# Patient Record
Sex: Male | Born: 1935 | Race: White | Hispanic: No | Marital: Married | Smoking: Former smoker
Health system: Southern US, Community
[De-identification: ages and names within clinical notes are randomized; demographics above are authoritative.]

## PROBLEM LIST (undated history)

## (undated) DIAGNOSIS — E039 Hypothyroidism, unspecified: Secondary | ICD-10-CM

## (undated) DIAGNOSIS — E119 Type 2 diabetes mellitus without complications: Secondary | ICD-10-CM

## (undated) DIAGNOSIS — M545 Low back pain, unspecified: Secondary | ICD-10-CM

## (undated) DIAGNOSIS — I872 Venous insufficiency (chronic) (peripheral): Secondary | ICD-10-CM

## (undated) DIAGNOSIS — N184 Chronic kidney disease, stage 4 (severe): Secondary | ICD-10-CM

## (undated) DIAGNOSIS — M1990 Unspecified osteoarthritis, unspecified site: Secondary | ICD-10-CM

## (undated) DIAGNOSIS — K219 Gastro-esophageal reflux disease without esophagitis: Secondary | ICD-10-CM

## (undated) DIAGNOSIS — D638 Anemia in other chronic diseases classified elsewhere: Secondary | ICD-10-CM

## (undated) DIAGNOSIS — R0989 Other specified symptoms and signs involving the circulatory and respiratory systems: Secondary | ICD-10-CM

## (undated) DIAGNOSIS — I441 Atrioventricular block, second degree: Secondary | ICD-10-CM

## (undated) DIAGNOSIS — K5790 Diverticulosis of intestine, part unspecified, without perforation or abscess without bleeding: Secondary | ICD-10-CM

## (undated) DIAGNOSIS — I2510 Atherosclerotic heart disease of native coronary artery without angina pectoris: Secondary | ICD-10-CM

## (undated) DIAGNOSIS — I5032 Chronic diastolic (congestive) heart failure: Secondary | ICD-10-CM

## (undated) DIAGNOSIS — I459 Conduction disorder, unspecified: Secondary | ICD-10-CM

## (undated) DIAGNOSIS — M25551 Pain in right hip: Secondary | ICD-10-CM

## (undated) DIAGNOSIS — I4892 Unspecified atrial flutter: Secondary | ICD-10-CM

## (undated) DIAGNOSIS — E785 Hyperlipidemia, unspecified: Secondary | ICD-10-CM

## (undated) DIAGNOSIS — I10 Essential (primary) hypertension: Secondary | ICD-10-CM

## (undated) DIAGNOSIS — J449 Chronic obstructive pulmonary disease, unspecified: Secondary | ICD-10-CM

## (undated) HISTORY — DX: Atherosclerotic heart disease of native coronary artery without angina pectoris: I25.10

## (undated) HISTORY — DX: Other specified symptoms and signs involving the circulatory and respiratory systems: R09.89

## (undated) HISTORY — DX: Low back pain, unspecified: M54.50

## (undated) HISTORY — DX: Venous insufficiency (chronic) (peripheral): I87.2

## (undated) HISTORY — DX: Pain in right hip: M25.551

## (undated) HISTORY — DX: Unspecified osteoarthritis, unspecified site: M19.90

## (undated) HISTORY — DX: Essential (primary) hypertension: I10

## (undated) HISTORY — DX: Diverticulosis of intestine, part unspecified, without perforation or abscess without bleeding: K57.90

## (undated) HISTORY — PX: TRANSTHORACIC ECHOCARDIOGRAM: SHX275

## (undated) HISTORY — DX: Type 2 diabetes mellitus without complications: E11.9

## (undated) HISTORY — DX: Anemia in other chronic diseases classified elsewhere: D63.8

## (undated) HISTORY — DX: Atrioventricular block, second degree: I44.1

## (undated) HISTORY — DX: Chronic diastolic (congestive) heart failure: I50.32

## (undated) HISTORY — DX: Low back pain: M54.5

## (undated) HISTORY — DX: Gastro-esophageal reflux disease without esophagitis: K21.9

## (undated) HISTORY — DX: Hypothyroidism, unspecified: E03.9

## (undated) HISTORY — DX: Conduction disorder, unspecified: I45.9

## (undated) HISTORY — DX: Hyperlipidemia, unspecified: E78.5

## (undated) HISTORY — PX: APPENDECTOMY: SHX54

## (undated) HISTORY — DX: Chronic obstructive pulmonary disease, unspecified: J44.9

## (undated) HISTORY — DX: Chronic kidney disease, stage 4 (severe): N18.4

## (undated) HISTORY — DX: Unspecified atrial flutter: I48.92

---

## 1996-11-17 ENCOUNTER — Encounter (INDEPENDENT_AMBULATORY_CARE_PROVIDER_SITE_OTHER): Payer: Self-pay | Admitting: *Deleted

## 1998-04-19 ENCOUNTER — Ambulatory Visit (HOSPITAL_COMMUNITY): Admission: RE | Admit: 1998-04-19 | Discharge: 1998-04-19 | Payer: Self-pay | Admitting: Gastroenterology

## 1998-04-19 ENCOUNTER — Encounter (INDEPENDENT_AMBULATORY_CARE_PROVIDER_SITE_OTHER): Payer: Self-pay | Admitting: *Deleted

## 2002-11-21 ENCOUNTER — Encounter: Payer: Self-pay | Admitting: *Deleted

## 2002-11-21 ENCOUNTER — Ambulatory Visit (HOSPITAL_COMMUNITY): Admission: RE | Admit: 2002-11-21 | Discharge: 2002-11-21 | Payer: Self-pay | Admitting: *Deleted

## 2003-02-13 ENCOUNTER — Inpatient Hospital Stay (HOSPITAL_COMMUNITY): Admission: AD | Admit: 2003-02-13 | Discharge: 2003-03-05 | Payer: Self-pay | Admitting: *Deleted

## 2003-02-13 ENCOUNTER — Encounter: Payer: Self-pay | Admitting: *Deleted

## 2003-03-13 ENCOUNTER — Encounter
Admission: RE | Admit: 2003-03-13 | Discharge: 2003-03-13 | Payer: Self-pay | Admitting: Thoracic Surgery (Cardiothoracic Vascular Surgery)

## 2003-03-27 ENCOUNTER — Encounter
Admission: RE | Admit: 2003-03-27 | Discharge: 2003-03-27 | Payer: Self-pay | Admitting: Thoracic Surgery (Cardiothoracic Vascular Surgery)

## 2003-04-01 ENCOUNTER — Encounter (HOSPITAL_COMMUNITY): Admission: RE | Admit: 2003-04-01 | Discharge: 2003-05-25 | Payer: Self-pay | Admitting: *Deleted

## 2003-04-25 HISTORY — PX: CORONARY ARTERY BYPASS GRAFT: SHX141

## 2004-03-08 ENCOUNTER — Ambulatory Visit: Payer: Self-pay | Admitting: Internal Medicine

## 2004-06-20 ENCOUNTER — Ambulatory Visit: Payer: Self-pay | Admitting: Internal Medicine

## 2004-06-20 ENCOUNTER — Ambulatory Visit: Payer: Self-pay | Admitting: *Deleted

## 2004-06-27 ENCOUNTER — Ambulatory Visit: Payer: Self-pay | Admitting: Internal Medicine

## 2004-08-08 ENCOUNTER — Ambulatory Visit: Payer: Self-pay | Admitting: Internal Medicine

## 2004-08-15 ENCOUNTER — Ambulatory Visit: Payer: Self-pay | Admitting: *Deleted

## 2004-08-15 ENCOUNTER — Ambulatory Visit: Payer: Self-pay | Admitting: Gastroenterology

## 2004-10-17 ENCOUNTER — Ambulatory Visit: Payer: Self-pay | Admitting: Gastroenterology

## 2004-12-19 ENCOUNTER — Ambulatory Visit: Payer: Self-pay | Admitting: Internal Medicine

## 2005-01-16 ENCOUNTER — Ambulatory Visit: Payer: Self-pay | Admitting: Cardiology

## 2005-03-13 ENCOUNTER — Ambulatory Visit: Payer: Self-pay | Admitting: Internal Medicine

## 2005-05-01 ENCOUNTER — Ambulatory Visit: Payer: Self-pay | Admitting: Internal Medicine

## 2005-08-07 ENCOUNTER — Ambulatory Visit: Payer: Self-pay | Admitting: Internal Medicine

## 2005-09-20 ENCOUNTER — Ambulatory Visit: Payer: Self-pay | Admitting: Internal Medicine

## 2005-12-04 ENCOUNTER — Ambulatory Visit: Payer: Self-pay | Admitting: Internal Medicine

## 2006-01-17 ENCOUNTER — Ambulatory Visit: Payer: Self-pay | Admitting: Cardiology

## 2006-02-21 ENCOUNTER — Ambulatory Visit: Payer: Self-pay | Admitting: Internal Medicine

## 2006-02-21 LAB — CONVERTED CEMR LAB
BUN: 30 mg/dL — ABNORMAL HIGH (ref 6–23)
CO2: 29 meq/L (ref 19–32)
Calcium: 9.9 mg/dL (ref 8.4–10.5)
Chloride: 105 meq/L (ref 96–112)
Creatinine, Ser: 1.7 mg/dL — ABNORMAL HIGH (ref 0.4–1.5)
GFR calc non Af Amer: 42 mL/min
Glomerular Filtration Rate, Af Am: 51 mL/min/{1.73_m2}
Glucose, Bld: 159 mg/dL — ABNORMAL HIGH (ref 70–99)
Hgb A1c MFr Bld: 6.1 % — ABNORMAL HIGH (ref 4.6–6.0)
Potassium: 5 meq/L (ref 3.5–5.1)
Sodium: 139 meq/L (ref 135–145)
T4, Total: 7.9 ug/dL (ref 5.0–12.5)
TSH: 1.38 microintl units/mL (ref 0.35–5.50)

## 2006-03-06 ENCOUNTER — Ambulatory Visit: Payer: Self-pay | Admitting: Internal Medicine

## 2006-03-19 ENCOUNTER — Encounter: Admission: RE | Admit: 2006-03-19 | Discharge: 2006-06-17 | Payer: Self-pay | Admitting: Internal Medicine

## 2006-06-11 ENCOUNTER — Ambulatory Visit: Payer: Self-pay | Admitting: Internal Medicine

## 2006-06-11 LAB — CONVERTED CEMR LAB
BUN: 32 mg/dL — ABNORMAL HIGH (ref 6–23)
CO2: 32 meq/L (ref 19–32)
Calcium: 10 mg/dL (ref 8.4–10.5)
Chloride: 107 meq/L (ref 96–112)
Creatinine, Ser: 1.4 mg/dL (ref 0.4–1.5)
GFR calc Af Amer: 63 mL/min
GFR calc non Af Amer: 52 mL/min
Glucose, Bld: 113 mg/dL — ABNORMAL HIGH (ref 70–99)
Hgb A1c MFr Bld: 6.2 % — ABNORMAL HIGH (ref 4.6–6.0)
Potassium: 5.1 meq/L (ref 3.5–5.1)
Sodium: 144 meq/L (ref 135–145)

## 2006-09-10 ENCOUNTER — Ambulatory Visit: Payer: Self-pay | Admitting: Internal Medicine

## 2006-09-10 LAB — CONVERTED CEMR LAB
BUN: 31 mg/dL — ABNORMAL HIGH (ref 6–23)
CO2: 28 meq/L (ref 19–32)
Calcium: 9.5 mg/dL (ref 8.4–10.5)
Chloride: 109 meq/L (ref 96–112)
Cholesterol: 162 mg/dL (ref 0–200)
Creatinine, Ser: 1.6 mg/dL — ABNORMAL HIGH (ref 0.4–1.5)
GFR calc Af Amer: 54 mL/min
GFR calc non Af Amer: 45 mL/min
Glucose, Bld: 100 mg/dL — ABNORMAL HIGH (ref 70–99)
HDL: 68.6 mg/dL (ref >39.0)
Hgb A1c MFr Bld: 6.2 % — ABNORMAL HIGH (ref 4.6–6.0)
LDL Cholesterol: 80 mg/dL (ref 0–99)
Potassium: 5.1 meq/L (ref 3.5–5.1)
Sodium: 141 meq/L (ref 135–145)
Total CHOL/HDL Ratio: 2.4
Triglycerides: 67 mg/dL (ref 0–149)
VLDL: 13 mg/dL (ref 0–40)

## 2006-12-13 ENCOUNTER — Ambulatory Visit: Payer: Self-pay | Admitting: Internal Medicine

## 2006-12-28 ENCOUNTER — Ambulatory Visit: Payer: Self-pay

## 2006-12-28 ENCOUNTER — Ambulatory Visit: Payer: Self-pay | Admitting: Cardiology

## 2007-01-04 ENCOUNTER — Ambulatory Visit: Payer: Self-pay

## 2007-04-12 ENCOUNTER — Ambulatory Visit: Payer: Self-pay | Admitting: Internal Medicine

## 2007-04-12 DIAGNOSIS — I2510 Atherosclerotic heart disease of native coronary artery without angina pectoris: Secondary | ICD-10-CM | POA: Insufficient documentation

## 2007-04-12 DIAGNOSIS — N183 Chronic kidney disease, stage 3 unspecified: Secondary | ICD-10-CM | POA: Insufficient documentation

## 2007-04-12 DIAGNOSIS — I739 Peripheral vascular disease, unspecified: Secondary | ICD-10-CM | POA: Insufficient documentation

## 2007-04-12 DIAGNOSIS — E785 Hyperlipidemia, unspecified: Secondary | ICD-10-CM | POA: Insufficient documentation

## 2007-04-12 DIAGNOSIS — M1990 Unspecified osteoarthritis, unspecified site: Secondary | ICD-10-CM | POA: Insufficient documentation

## 2007-04-12 DIAGNOSIS — M545 Low back pain, unspecified: Secondary | ICD-10-CM | POA: Insufficient documentation

## 2007-04-12 DIAGNOSIS — J449 Chronic obstructive pulmonary disease, unspecified: Secondary | ICD-10-CM | POA: Insufficient documentation

## 2007-04-12 DIAGNOSIS — E039 Hypothyroidism, unspecified: Secondary | ICD-10-CM | POA: Insufficient documentation

## 2007-04-12 DIAGNOSIS — K8020 Calculus of gallbladder without cholecystitis without obstruction: Secondary | ICD-10-CM | POA: Insufficient documentation

## 2007-04-12 DIAGNOSIS — K219 Gastro-esophageal reflux disease without esophagitis: Secondary | ICD-10-CM | POA: Insufficient documentation

## 2007-04-12 DIAGNOSIS — I10 Essential (primary) hypertension: Secondary | ICD-10-CM | POA: Insufficient documentation

## 2007-08-09 ENCOUNTER — Ambulatory Visit: Payer: Self-pay | Admitting: Internal Medicine

## 2007-08-09 LAB — CONVERTED CEMR LAB
ALT: 15 U/L
AST: 23 U/L
Albumin: 3.7 g/dL
Alkaline Phosphatase: 71 U/L
BUN: 35 mg/dL — ABNORMAL HIGH
Basophils Absolute: 0 10*3/uL
Basophils Relative: 0 %
Bilirubin, Direct: 0.1 mg/dL
CO2: 32 meq/L
Calcium: 9.7 mg/dL
Chloride: 103 meq/L
Cholesterol: 154 mg/dL
Creatinine, Ser: 1.4 mg/dL
Eosinophils Absolute: 0.2 10*3/uL
Eosinophils Relative: 3.3 %
GFR calc Af Amer: 63 mL/min
GFR calc non Af Amer: 52 mL/min
Glucose, Bld: 107 mg/dL — ABNORMAL HIGH
HCT: 39.8 %
HDL: 60.3 mg/dL
Hemoglobin: 13.1 g/dL
LDL Cholesterol: 84 mg/dL
Lymphocytes Relative: 27.7 %
MCHC: 32.9 g/dL
MCV: 87.8 fL
Monocytes Absolute: 0.5 10*3/uL
Monocytes Relative: 8.9 %
Neutro Abs: 3.1 10*3/uL
Neutrophils Relative %: 60.1 %
PSA: 0.64 ng/mL
Platelets: 191 10*3/uL
Potassium: 5.1 meq/L
RBC: 4.53 M/uL
RDW: 13.2 %
Sodium: 139 meq/L
TSH: 0.65 u[IU]/mL
Total Bilirubin: 0.9 mg/dL
Total CHOL/HDL Ratio: 2.6
Total Protein: 6.7 g/dL
Triglycerides: 50 mg/dL
VLDL: 10 mg/dL
WBC: 5.3 10*3/uL

## 2007-08-14 ENCOUNTER — Ambulatory Visit: Payer: Self-pay | Admitting: Internal Medicine

## 2008-02-10 ENCOUNTER — Ambulatory Visit: Payer: Self-pay | Admitting: Internal Medicine

## 2008-02-10 DIAGNOSIS — M25569 Pain in unspecified knee: Secondary | ICD-10-CM | POA: Insufficient documentation

## 2008-08-06 ENCOUNTER — Ambulatory Visit: Payer: Self-pay | Admitting: Internal Medicine

## 2008-08-06 LAB — CONVERTED CEMR LAB
ALT: 14 units/L (ref 0–53)
AST: 22 units/L (ref 0–37)
Albumin: 3.8 g/dL (ref 3.5–5.2)
Alkaline Phosphatase: 90 units/L (ref 39–117)
BUN: 24 mg/dL — ABNORMAL HIGH (ref 6–23)
Basophils Absolute: 0 10*3/uL (ref 0.0–0.1)
Basophils Relative: 0.2 % (ref 0.0–3.0)
Bilirubin Urine: NEGATIVE
Bilirubin, Direct: 0.1 mg/dL (ref 0.0–0.3)
CO2: 30 meq/L (ref 19–32)
Calcium: 9.8 mg/dL (ref 8.4–10.5)
Chloride: 106 meq/L (ref 96–112)
Cholesterol: 159 mg/dL (ref 0–200)
Creatinine, Ser: 1.6 mg/dL — ABNORMAL HIGH (ref 0.4–1.5)
Eosinophils Absolute: 0.2 10*3/uL (ref 0.0–0.7)
Eosinophils Relative: 2.8 % (ref 0.0–5.0)
GFR calc non Af Amer: 44.42 mL/min (ref >60)
Glucose, Bld: 96 mg/dL (ref 70–99)
HCT: 40.8 % (ref 39.0–52.0)
HDL: 67.1 mg/dL (ref >39.00)
Hemoglobin, Urine: NEGATIVE
Hemoglobin: 13.9 g/dL (ref 13.0–17.0)
Ketones, ur: NEGATIVE mg/dL
LDL Cholesterol: 79 mg/dL (ref 0–99)
Leukocytes, UA: NEGATIVE
Lymphocytes Relative: 16.5 % (ref 12.0–46.0)
Lymphs Abs: 1.3 10*3/uL (ref 0.7–4.0)
MCHC: 34.1 g/dL (ref 30.0–36.0)
MCV: 88.6 fL (ref 78.0–100.0)
Monocytes Absolute: 0.5 10*3/uL (ref 0.1–1.0)
Monocytes Relative: 7 % (ref 3.0–12.0)
Neutro Abs: 5.6 10*3/uL (ref 1.4–7.7)
Neutrophils Relative %: 73.5 % (ref 43.0–77.0)
Nitrite: NEGATIVE
PSA: 0.67 ng/mL (ref 0.10–4.00)
Platelets: 137 10*3/uL — ABNORMAL LOW (ref 150.0–400.0)
Potassium: 5 meq/L (ref 3.5–5.1)
RBC: 4.61 M/uL (ref 4.22–5.81)
RDW: 13.4 % (ref 11.5–14.6)
Sodium: 141 meq/L (ref 135–145)
Specific Gravity, Urine: >=1.03 (ref 1.000–1.030)
TSH: 1.13 microintl units/mL (ref 0.35–5.50)
Total Bilirubin: 1.1 mg/dL (ref 0.3–1.2)
Total CHOL/HDL Ratio: 2
Total Protein, Urine: NEGATIVE mg/dL
Total Protein: 6.7 g/dL (ref 6.0–8.3)
Triglycerides: 66 mg/dL (ref 0.0–149.0)
Urine Glucose: NEGATIVE mg/dL
Urobilinogen, UA: 0.2 (ref 0.0–1.0)
VLDL: 13.2 mg/dL (ref 0.0–40.0)
WBC: 7.6 10*3/uL (ref 4.5–10.5)
pH: 5.5 (ref 5.0–8.0)

## 2008-08-11 ENCOUNTER — Ambulatory Visit: Payer: Self-pay | Admitting: Internal Medicine

## 2008-12-04 ENCOUNTER — Ambulatory Visit: Payer: Self-pay | Admitting: Internal Medicine

## 2008-12-04 LAB — CONVERTED CEMR LAB
BUN: 34 mg/dL — ABNORMAL HIGH (ref 6–23)
CO2: 29 meq/L (ref 19–32)
Calcium: 9.8 mg/dL (ref 8.4–10.5)
Chloride: 106 meq/L (ref 96–112)
Creatinine, Ser: 1.7 mg/dL — ABNORMAL HIGH (ref 0.4–1.5)
GFR calc non Af Amer: 41.38 mL/min (ref >60)
Glucose, Bld: 116 mg/dL — ABNORMAL HIGH (ref 70–99)
Potassium: 5.3 meq/L — ABNORMAL HIGH (ref 3.5–5.1)
Sodium: 140 meq/L (ref 135–145)
TSH: 0.4 microintl units/mL (ref 0.35–5.50)

## 2008-12-11 ENCOUNTER — Ambulatory Visit: Payer: Self-pay | Admitting: Internal Medicine

## 2008-12-11 DIAGNOSIS — E875 Hyperkalemia: Secondary | ICD-10-CM | POA: Insufficient documentation

## 2009-03-15 ENCOUNTER — Ambulatory Visit: Payer: Self-pay | Admitting: Internal Medicine

## 2009-03-16 LAB — CONVERTED CEMR LAB
BUN: 22 mg/dL (ref 6–23)
CO2: 30 meq/L (ref 19–32)
Calcium: 9.8 mg/dL (ref 8.4–10.5)
Chloride: 106 meq/L (ref 96–112)
Creatinine, Ser: 1.6 mg/dL — ABNORMAL HIGH (ref 0.4–1.5)
GFR calc non Af Amer: 44.35 mL/min (ref >60)
Glucose, Bld: 113 mg/dL — ABNORMAL HIGH (ref 70–99)
Potassium: 5.2 meq/L — ABNORMAL HIGH (ref 3.5–5.1)
Sodium: 141 meq/L (ref 135–145)

## 2009-03-17 ENCOUNTER — Ambulatory Visit: Payer: Self-pay | Admitting: Internal Medicine

## 2009-03-17 DIAGNOSIS — Z87891 Personal history of nicotine dependence: Secondary | ICD-10-CM | POA: Insufficient documentation

## 2009-06-08 ENCOUNTER — Ambulatory Visit: Payer: Self-pay | Admitting: Internal Medicine

## 2009-06-08 LAB — CONVERTED CEMR LAB
BUN: 26 mg/dL — ABNORMAL HIGH (ref 6–23)
CO2: 29 meq/L (ref 19–32)
Calcium: 9.6 mg/dL (ref 8.4–10.5)
Chloride: 108 meq/L (ref 96–112)
Creatinine, Ser: 1.5 mg/dL (ref 0.4–1.5)
GFR calc non Af Amer: 47.75 mL/min (ref >60)
Glucose, Bld: 105 mg/dL — ABNORMAL HIGH (ref 70–99)
Potassium: 4.9 meq/L (ref 3.5–5.1)
Sodium: 142 meq/L (ref 135–145)

## 2009-06-14 ENCOUNTER — Ambulatory Visit: Payer: Self-pay | Admitting: Internal Medicine

## 2009-06-14 DIAGNOSIS — D100 Benign neoplasm of lip: Secondary | ICD-10-CM | POA: Insufficient documentation

## 2009-09-13 ENCOUNTER — Encounter (INDEPENDENT_AMBULATORY_CARE_PROVIDER_SITE_OTHER): Payer: Self-pay | Admitting: *Deleted

## 2009-09-30 ENCOUNTER — Ambulatory Visit: Payer: Self-pay | Admitting: Internal Medicine

## 2009-09-30 LAB — CONVERTED CEMR LAB
ALT: 13 units/L (ref 0–53)
AST: 20 units/L (ref 0–37)
Albumin: 3.6 g/dL (ref 3.5–5.2)
Alkaline Phosphatase: 83 units/L (ref 39–117)
BUN: 35 mg/dL — ABNORMAL HIGH (ref 6–23)
Basophils Absolute: 0 10*3/uL (ref 0.0–0.1)
Basophils Relative: 0.3 % (ref 0.0–3.0)
Bilirubin Urine: NEGATIVE
Bilirubin, Direct: 0.1 mg/dL (ref 0.0–0.3)
CO2: 28 meq/L (ref 19–32)
Calcium: 9.3 mg/dL (ref 8.4–10.5)
Chloride: 106 meq/L (ref 96–112)
Cholesterol: 139 mg/dL (ref 0–200)
Creatinine, Ser: 1.6 mg/dL — ABNORMAL HIGH (ref 0.4–1.5)
Eosinophils Absolute: 0.2 10*3/uL (ref 0.0–0.7)
Eosinophils Relative: 4 % (ref 0.0–5.0)
GFR calc non Af Amer: 43.35 mL/min (ref >60)
Glucose, Bld: 120 mg/dL — ABNORMAL HIGH (ref 70–99)
HCT: 37.2 % — ABNORMAL LOW (ref 39.0–52.0)
HDL: 58.3 mg/dL (ref >39.00)
Hemoglobin, Urine: NEGATIVE
Hemoglobin: 12.5 g/dL — ABNORMAL LOW (ref 13.0–17.0)
Ketones, ur: NEGATIVE mg/dL
LDL Cholesterol: 70 mg/dL (ref 0–99)
Leukocytes, UA: NEGATIVE
Lymphocytes Relative: 23.8 % (ref 12.0–46.0)
Lymphs Abs: 1.5 10*3/uL (ref 0.7–4.0)
MCHC: 33.5 g/dL (ref 30.0–36.0)
MCV: 88.5 fL (ref 78.0–100.0)
Monocytes Absolute: 0.5 10*3/uL (ref 0.1–1.0)
Monocytes Relative: 8.4 % (ref 3.0–12.0)
Neutro Abs: 4 10*3/uL (ref 1.4–7.7)
Neutrophils Relative %: 63.5 % (ref 43.0–77.0)
Nitrite: NEGATIVE
PSA: 0.49 ng/mL (ref 0.10–4.00)
Platelets: 151 10*3/uL (ref 150.0–400.0)
Potassium: 5.3 meq/L — ABNORMAL HIGH (ref 3.5–5.1)
RBC: 4.2 M/uL — ABNORMAL LOW (ref 4.22–5.81)
RDW: 16.4 % — ABNORMAL HIGH (ref 11.5–14.6)
Sodium: 140 meq/L (ref 135–145)
Specific Gravity, Urine: >=1.03 (ref 1.000–1.030)
TSH: 1.81 microintl units/mL (ref 0.35–5.50)
Total Bilirubin: 0.9 mg/dL (ref 0.3–1.2)
Total CHOL/HDL Ratio: 2
Total Protein, Urine: NEGATIVE mg/dL
Total Protein: 6.1 g/dL (ref 6.0–8.3)
Triglycerides: 52 mg/dL (ref 0.0–149.0)
Urine Glucose: NEGATIVE mg/dL
Urobilinogen, UA: 0.2 (ref 0.0–1.0)
VLDL: 10.4 mg/dL (ref 0.0–40.0)
WBC: 6.3 10*3/uL (ref 4.5–10.5)
pH: 5.5 (ref 5.0–8.0)

## 2009-10-06 ENCOUNTER — Ambulatory Visit: Payer: Self-pay | Admitting: Internal Medicine

## 2009-10-06 ENCOUNTER — Encounter (INDEPENDENT_AMBULATORY_CARE_PROVIDER_SITE_OTHER): Payer: Self-pay | Admitting: *Deleted

## 2009-10-06 DIAGNOSIS — M25559 Pain in unspecified hip: Secondary | ICD-10-CM | POA: Insufficient documentation

## 2009-10-13 ENCOUNTER — Encounter (INDEPENDENT_AMBULATORY_CARE_PROVIDER_SITE_OTHER): Payer: Self-pay | Admitting: *Deleted

## 2009-11-02 ENCOUNTER — Ambulatory Visit: Payer: Self-pay | Admitting: Cardiology

## 2009-11-02 DIAGNOSIS — R0989 Other specified symptoms and signs involving the circulatory and respiratory systems: Secondary | ICD-10-CM | POA: Insufficient documentation

## 2009-11-25 ENCOUNTER — Ambulatory Visit: Payer: Self-pay | Admitting: Gastroenterology

## 2009-11-25 DIAGNOSIS — Z860100 Personal history of colon polyps, unspecified: Secondary | ICD-10-CM | POA: Insufficient documentation

## 2009-12-07 ENCOUNTER — Encounter: Payer: Self-pay | Admitting: Cardiology

## 2009-12-07 ENCOUNTER — Ambulatory Visit: Payer: Self-pay | Admitting: Internal Medicine

## 2009-12-07 ENCOUNTER — Ambulatory Visit: Payer: Self-pay

## 2009-12-07 ENCOUNTER — Ambulatory Visit (HOSPITAL_COMMUNITY): Admission: RE | Admit: 2009-12-07 | Discharge: 2009-12-07 | Payer: Self-pay | Admitting: Cardiology

## 2009-12-29 ENCOUNTER — Ambulatory Visit: Payer: Self-pay | Admitting: Gastroenterology

## 2010-01-03 ENCOUNTER — Ambulatory Visit: Payer: Self-pay | Admitting: Internal Medicine

## 2010-01-03 LAB — CONVERTED CEMR LAB
BUN: 27 mg/dL — ABNORMAL HIGH (ref 6–23)
Basophils Absolute: 0 10*3/uL (ref 0.0–0.1)
Basophils Relative: 0.4 % (ref 0.0–3.0)
CO2: 31 meq/L (ref 19–32)
Calcium: 9.3 mg/dL (ref 8.4–10.5)
Chloride: 104 meq/L (ref 96–112)
Creatinine, Ser: 1.7 mg/dL — ABNORMAL HIGH (ref 0.4–1.5)
Eosinophils Absolute: 0.2 10*3/uL (ref 0.0–0.7)
Eosinophils Relative: 2.4 % (ref 0.0–5.0)
GFR calc non Af Amer: 41.27 mL/min (ref >60)
Glucose, Bld: 110 mg/dL — ABNORMAL HIGH (ref 70–99)
HCT: 41.4 % (ref 39.0–52.0)
Hemoglobin: 14 g/dL (ref 13.0–17.0)
Lymphocytes Relative: 26.8 % (ref 12.0–46.0)
Lymphs Abs: 1.7 10*3/uL (ref 0.7–4.0)
MCHC: 33.7 g/dL (ref 30.0–36.0)
MCV: 90.7 fL (ref 78.0–100.0)
Monocytes Absolute: 0.5 10*3/uL (ref 0.1–1.0)
Monocytes Relative: 8.4 % (ref 3.0–12.0)
Neutro Abs: 3.9 10*3/uL (ref 1.4–7.7)
Neutrophils Relative %: 62 % (ref 43.0–77.0)
Platelets: 158 10*3/uL (ref 150.0–400.0)
Potassium: 4.6 meq/L (ref 3.5–5.1)
RBC: 4.57 M/uL (ref 4.22–5.81)
RDW: 14.3 % (ref 11.5–14.6)
Sodium: 142 meq/L (ref 135–145)
WBC: 6.3 10*3/uL (ref 4.5–10.5)

## 2010-01-05 ENCOUNTER — Ambulatory Visit: Payer: Self-pay | Admitting: Internal Medicine

## 2010-01-05 DIAGNOSIS — I6529 Occlusion and stenosis of unspecified carotid artery: Secondary | ICD-10-CM | POA: Insufficient documentation

## 2010-05-14 ENCOUNTER — Encounter: Payer: Self-pay | Admitting: Thoracic Surgery (Cardiothoracic Vascular Surgery)

## 2010-05-26 NOTE — Letter (Signed)
Summary: [facility] Instructions  [facility]  [place], [place] [address]   Phone: [phone]  Fax: [phone]       SLAYDE DOFFLEMYER    1927-04-02    MRN: [id number]        Procedure Day /Date:WEDNESDAY 12/29/2009     Arrival Time:1:30PM     Procedure Time:2:30PM     Location of Procedure:                    X   [place] (4th Floor)   [place]   Starting 5 days prior to your procedure9/2/2011do not eat nuts, seeds, popcorn, corn, beans, peas,  salads, or any raw vegetables.  Do not take any fiber supplements (e.g. Metamucil, Citrucel, and Benefiber).  THE DAY BEFORE YOUR PROCEDURE         DATE: 12/28/2009 DAY: TUESDAY  1.  Drink clear liquids the entire day-NO SOLID FOOD  2.  Do not drink anything colored red or purple.  Avoid juices with pulp.  No orange juice.  3.  Drink at least 64 oz. (8 glasses) of fluid/clear liquids during the day to prevent dehydration and help the prep work efficiently.  CLEAR LIQUIDS INCLUDE: Water Jello Ice Popsicles Tea (sugar ok, no milk/cream) Powdered fruit flavored drinks Coffee (sugar ok, no milk/cream) Gatorade Juice: apple, white grape, white cranberry  Lemonade Clear bullion, consomm, broth Carbonated beverages (any kind) Strained chicken noodle soup Hard Candy                             4.  In the morning, mix first dose of MoviPrep solution:    Empty 1 Pouch A and 1 Pouch B into the disposable container    Add lukewarm drinking water to the top line of the container. Mix to dissolve    Refrigerate (mixed solution should be used within 24 hrs)  5.  Begin drinking the prep at 5:00 p.m. The MoviPrep container is divided by 4 marks.   Every 15 minutes drink the solution down to the next mark (approximately 8 oz) until the full liter is complete.   6.  Follow completed prep with 16 oz of clear liquid of your choice (Nothing red or purple).  Continue  to drink clear liquids until bedtime.  7.  Before going to bed, mix second dose of MoviPrep solution:    Empty 1 Pouch A and 1 Pouch B into the disposable container    Add lukewarm drinking water to the top line of the container. Mix to dissolve    Refrigerate  THE DAY OF YOUR PROCEDURE      DATE: [facility] DAY: 12/29/2009  Beginning at 9:30a.m. (5 hours before procedure):         1. Every 15 minutes, drink the solution down to the next mark (approx 8 oz) until the full liter is complete.  2. Follow completed prep with 16 oz. of clear liquid of your choice.    3. You may drink clear liquids until 12:30PM(2 HOURS BEFORE PROCEDURE).   MEDICATION INSTRUCTIONS  Unless otherwise instructed, you should take regular prescription medications with a small sip of water   as early as possible the morning of your procedure.          OTHER INSTRUCTIONS  You will need a responsible adult at least 75 years of age to accompany you and drive you  home.   This person must remain in the waiting room during your procedure.  Wear loose fitting clothing that is easily removed.  Leave jewelry and other valuables at home.  However, you may wish to bring a book to read or  an iPod/MP3 player to listen to music as you wait for your procedure to start.  Remove all body piercing jewelry and leave at home.  Total time from sign-in until discharge is approximately 2-3 hours.  You should go home directly after your procedure and rest.  You can resume normal activities the  day after your procedure.  The day of your procedure you should not:   Drive   Make legal decisions   Operate machinery   Drink alcohol   Return to work  You will receive specific instructions about eating, activities and medications before you leave.    The above instructions have been reviewed and explained to me by   _______________________    I fully understand and can verbalize these instructions  _____________________________ Date _________

## 2010-05-26 NOTE — Letter (Signed)
Summary: New Patient letter  [facility]  [address] [place], [place] [address]   Phone: [phone]  Fax: [phone]       10/13/2009 MRN: [id number]  [facility] [address] [place], [place]  [address]  Dear Allen Holloway,  Welcome to the Gastroenterology Division at [facility].    You are scheduled to see Dr.  Erskine Emery on November 24, 2009 at 8:30am on the 3rd floor at [facility] [place] [facility].  We ask that you try to arrive at our office 15 minutes prior to your appointment time to allow for check-in.  We would like you to complete the enclosed self-administered evaluation form prior to your visit and bring it with you on the day of your appointment.  We will review it with you.  Also, please bring a complete list of all your medications or, if you prefer, bring the medication bottles and we will list them.  Please bring your insurance card so that we may make a copy of it.  If your insurance requires a referral to see a specialist, please bring your referral form from your primary care physician.  Co-payments are due at the time of your visit and may be paid by cash, check or credit card.     Your office visit will consist of a consult with your physician (includes a physical exam), any laboratory testing he/she may order, scheduling of any necessary diagnostic testing (e.g. x-ray, ultrasound, CT-scan), and scheduling of a procedure (e.g. Endoscopy, Colonoscopy) if required.  Please allow enough time on your schedule to allow for any/all of these possibilities.    If you cannot keep your appointment, please call [phone] to cancel or reschedule prior to your appointment date.  This allows [place] the opportunity to schedule an appointment for another patient in need of care.  If you do not cancel or reschedule by 5 p.m. the business day prior to your appointment date, you will be charged a $50.00 late cancellation/no-show fee.    Thank you for  choosing [facility] for your medical needs.  We appreciate the opportunity to care for you.  Please visit [place] at our website  to learn more about our practice.                     Sincerely,                                                             The Gastroenterology Division

## 2010-05-26 NOTE — Procedures (Signed)
Summary: Colonoscopy/[place]  Colonoscopy/[place]   Imported By: Phillis Knack 11/25/2009 14:33:44  _____________________________________________________________________  External Attachment:    Type:   Image     Comment:   External Document

## 2010-05-26 NOTE — Letter (Signed)
Summary: Colonoscopy-Changed to Office Visit Letter  [facility]  [address] [place], [place] [address]   Phone: [phone]  Fax: [phone]      Sep 13, 2009 MRN: [id number]   [facility] [address] [place], [place]  [address]   Dear Allen Holloway,   According to our records, it is time for you to schedule a Colonoscopy. However, after reviewing your medical record, I feel that an office visit would be most appropriate to more completely evaluate you and determine your need for a repeat procedure.  Please call [phone] (option #2) at your convenience to schedule an office visit. If you have any questions, concerns, or feel that this letter is in error, we would appreciate your call.   Sincerely,  Sandy Salaam. Deatra Ina, M.D.  [facility]astro[facility] Division [phone]

## 2010-05-26 NOTE — Assessment & Plan Note (Signed)
Summary: 3 MO ROV /NWS  #   Vital Signs:  Patient profile:   77 year old male Height:      68 inches Weight:      197.25 pounds BMI:     30.10 O2 Sat:      99 % on Room air Temp:     96.9 degrees F oral Pulse rate:   50 / minute BP sitting:   130 / 60  (left arm) Cuff size:   regular  O2 Flow:  Room air CC: 3 mo rtn ov./kb Is Patient Diabetic? No Pain Assessment Patient in pain? no        Primary Care Provider:  Evie Lacks Plotnikov MD  CC:  3 mo rtn ov./kb.  History of Present Illness: The patient presents for a wellness examination  Patient past medical history, social history, and family history reviewed in detail no significant changes.  Patient is physically active. Depression is negative and mood is good. Hearing is normal, and able to perform activities of daily living. Risk of falling is negligible and home safety has been reviewed and is appropriate. Patient has normal height, weight, and visual acuity. Patient has been counseled on age-appropriate routine health concerns for screening and prevention. Education, counseling, and    Current Medications (verified): 1)  Omeprazole 20 Mg Cpdr (Omeprazole) .... Take 2 Tabs Daily 2)  Levothroid 100 Mcg Tabs (Levothyroxine Sodium) .Marland Kitchen.. 1 By Mouth Once Daily 3)  Simvastatin 80 Mg Tabs (Simvastatin) .... 1/2  Once Daily 4)  Aspirin 81 Mg Tabs (Aspirin) .... Once Daily 5)  Vitamin D3 1000 Unit  Tabs (Cholecalciferol) .Marland Kitchen.. 1 Qd 6)  Toprol Xl 50 Mg Xr24h-Tab (Metoprolol Succinate) .... 1/2 By Mouth Bid 7)  Triamcinolone Acetonide 0.1 % Oint (Triamcinolone Acetonide) .... Use 1-2 Times A Day 8)  Hydrochlorothiazide 12.5 Mg Caps (Hydrochlorothiazide) .Marland Kitchen.. 1 By Mouth Once Daily For Blood Pressure  Allergies (verified): 1)  Lotensin (Benazepril Hcl)  Past History:  Past Medical History: Last updated: 06/14/2009 COPD Coronary artery disease Hyperlipidemia Hypertension Peripheral vascular disease Cholelithiasis Renal  insufficiency Low back pain Osteoarthritis GERD Hypothyroidism Gets wellness at [place] q 1 y  Past Surgical History: Last updated: 04/12/2007 Coronary artery bypass graft 2005  Family History: Last updated: 04/12/2007 Family History High cholesterol Family History Hypertension  Social History: Last updated: 04/12/2007 Retired Married Former Smoker  Review of Systems       The patient complains of difficulty walking.  The patient denies anorexia, fever, weight loss, weight gain, vision loss, decreased hearing, hoarseness, chest pain, syncope, dyspnea on exertion, peripheral edema, prolonged cough, headaches, hemoptysis, abdominal pain, melena, hematochezia, severe indigestion/heartburn, hematuria, incontinence, genital sores, muscle weakness, suspicious skin lesions, transient blindness, depression, unusual weight change, abnormal bleeding, enlarged lymph nodes, angioedema, and testicular masses.         R LBP  Physical Exam  General:  pleasant and cooperative Head:  Normocephalic and atraumatic without obvious abnormalities. No apparent alopecia or balding. Eyes:  No corneal or conjunctival inflammation noted. EOMI. Perrla Ears:  External ear exam shows no significant lesions or deformities.  Otoscopic examination reveals clear canals, tympanic membranes are intact bilaterally without bulging, retraction, inflammation or discharge. Hearing is grossly normal bilaterally. Nose:  External nasal examination shows no deformity or inflammation. Nasal mucosa are pink and moist without lesions or exudates. Mouth:  Oral mucosa and oropharynx without lesions or exudates.  Teeth in good repair. Neck:  WNL Chest Wall:  No deformities, masses, tenderness  or gynecomastia noted. Lungs:  Normal respiratory effort, chest expands symmetrically. Lungs are clear to auscultation, no crackles or wheezes. Heart:  Normal rate and regular rhythm. S1 and S2 normal without gallop, murmur, click, rub or other  extra sounds. Abdomen:  soft, non-tender with hypoactive BS, no organomegalies, no masses Rectal:  will be done by GI   appt is pending  Genitalia:  WNL Prostate:  as above Msk:  No tenderness of coccyx, normal gait R SI tender Pulses:  R and L carotid,radial,femoral,dorsalis pedis and posterior tibial pulses are full and equal bilaterally Extremities:  No edema B Neurologic:  No cranial nerve deficits noted. Station and gait are normal. Plantar reflexes are down-going bilaterally. DTRs are symmetrical throughout. Sensory, motor and coordinative functions appear intact. Slight ataxia Skin:  Intact without suspicious lesions or rashes Psych:  Cognition and judgment appear intact. Alert and cooperative with normal attention span and concentration. No apparent delusions, illusions, hallucinations   Impression & Recommendations:  Problem # 1:  PHYSICAL EXAMINATION (ICD-V70.0) Assessment New Overall doing well, age appropriate education and counseling updated and referral for appropriate preventive services done unless declined, immunizations up to date or declined, diet counseling done if overweight, urged to quit smoking if smokes, most recent labs reviewed and current ordered if appropriate, ecg reviewed or declined (interpretation per ECG scanned in the EMR if done); information regarding Medicare Preventation requirements given if appropriate.  The labs were reviewed with the patient.  Orders: EKG w/ Interpretation (93000) Gastroenterology Referral (GI) Dr Deatra Ina to discuss colon  Problem # 2:  HYPOTHYROIDISM (ICD-244.9) Assessment: Comment Only  His updated medication list for this problem includes:    Levothroid 100 Mcg Tabs (Levothyroxine sodium) .Marland Kitchen... 1 by mouth once daily  Problem # 3:  HYPERKALEMIA (ICD-276.7) Assessment: Comment Only  Problem # 4:  BACK PAIN, LUMBAR (ICD-724.2) Assessment: Unchanged Ortho consult offered  Problem # 5:  PERIPHERAL VASCULAR DISEASE  (ICD-443.9) Assessment: Unchanged  Problem # 6:  CORONARY ARTERY DISEASE (ICD-414.00) Assessment: Unchanged  His updated medication list for this problem includes:    Aspirin 81 Mg Tabs (Aspirin) ..... Once daily    Toprol Xl 50 Mg Xr24h-tab (Metoprolol succinate) .Marland Kitchen... 1/2 by mouth bid    Hydrochlorothiazide 12.5 Mg Caps (Hydrochlorothiazide) .Marland Kitchen... 1 by mouth once daily for blood pressure  Orders: Cardiology Referral (Cardiology)  Complete Medication List: 1)  Omeprazole 20 Mg Cpdr (Omeprazole) .... Take 2 tabs daily 2)  Levothroid 100 Mcg Tabs (Levothyroxine sodium) .Marland Kitchen.. 1 by mouth once daily 3)  Simvastatin 80 Mg Tabs (Simvastatin) .... 1/2  once daily 4)  Aspirin 81 Mg Tabs (Aspirin) .... Once daily 5)  Vitamin D3 1000 Unit Tabs (Cholecalciferol) .Marland Kitchen.. 1 qd 6)  Toprol Xl 50 Mg Xr24h-tab (Metoprolol succinate) .... 1/2 by mouth bid 7)  Triamcinolone Acetonide 0.1 % Oint (Triamcinolone acetonide) .... Use 1-2 times a day 8)  Hydrochlorothiazide 12.5 Mg Caps (Hydrochlorothiazide) .Marland Kitchen.. 1 by mouth once daily for blood pressure  Other Orders: T-Pelvis 1or 2 views (72170TC)  Patient Instructions: 1)  Please schedule a follow-up appointment in 3 months. 2)  BMP prior to visit, ICD-9:401.1 3)  CBC w/ Diff prior to visit, ICD-9:995.20

## 2010-05-26 NOTE — Assessment & Plan Note (Signed)
Summary: consult before col pt age...em   History of Present Illness Visit Type: Initial Consult Primary GI MD: Erskine Emery MD [facility] Primary Yechiel Erny: Walker Kehr, MD Chief Complaint: Patient here to discuss having a colonoscopy. He denies any current GI complaints. History of Present Illness:   Mr. Fallone is a pleasant 77 year old white male referred at the request of Dr. Alain Marion for colonoscopy.  He has a history of colon polyps.  Last examination in 2006 demonstrated diverticulosis only.  He has no GI complaints or hematochezia.  The patient has coronary artery disease and COPD which have been stable.   GI Review of Systems      Denies abdominal pain, acid reflux, belching, bloating, chest pain, dysphagia with liquids, dysphagia with solids, heartburn, loss of appetite, nausea, vomiting, vomiting blood, weight loss, and  weight gain.        Denies anal fissure, black tarry stools, change in bowel habit, constipation, diarrhea, diverticulosis, fecal incontinence, heme positive stool, hemorrhoids, irritable bowel syndrome, jaundice, light color stool, liver problems, rectal bleeding, and  rectal pain. Preventive Screening-Counseling & Management  Caffeine-Diet-Exercise     Does Patient Exercise: yes      Drug Use:  no.      Current Medications (verified): 1)  Omeprazole 20 Mg Cpdr (Omeprazole) .... Take 2 Tabs Daily 2)  Levothroid 100 Mcg Tabs (Levothyroxine Sodium) .Marland Kitchen.. 1 By Mouth Once Daily 3)  Simvastatin 80 Mg Tabs (Simvastatin) .... 1/2  Once Daily 4)  Aspirin 81 Mg Tabs (Aspirin) .... Once Daily 5)  Vitamin D3 1000 Unit  Tabs (Cholecalciferol) .Marland Kitchen.. 1 Qd 6)  Toprol Xl 25 Mg Xr24h-Tab (Metoprolol Succinate) .... One Daily 7)  Triamcinolone Acetonide 0.1 % Oint (Triamcinolone Acetonide) .... Use 1-2 Times A Day 8)  Hydrochlorothiazide 12.5 Mg Caps (Hydrochlorothiazide) .Marland Kitchen.. 1 By Mouth Once Daily For Blood Pressure  Allergies (verified): 1)  Lotensin (Benazepril  Hcl)  Past History:  Past Medical History: Reviewed history from 11/22/2009 and no changes required. 1.  COPD 2.  Coronary artery disease: CABG 2005 with LIMA-LAD, SVG-PDA, SVG-D1, SVG-OM.  Myoview (9/08) showed EF 63%, no scar or ischemia.  3.  Hyperlipidemia 4.  Hypertension 5.  Carotid bruit 6.  Cholelithiasis 7.  CKD 8.  Low back pain 9.  Osteoarthritis 10. GERD 11. Hypothyroidism Gets wellness at [facility] q 1 y Diverticulosis  Past Surgical History: Coronary artery bypass graft 2005 Appendectomy  Family History: Family History High cholesterol Family History Hypertension No FH of Colon Cancer:  Social History: Retired Married Former Smoker-stopped 20-30 years ago Alcohol Use - no Daily Caffeine Use-4 cups daily Illicit Drug Use - no Patient gets regular exercise. Drug Use:  no Does Patient Exercise:  yes  Review of Systems  The patient denies allergy/sinus, anemia, anxiety-new, arthritis/joint pain, back pain, blood in urine, breast changes/lumps, change in vision, confusion, cough, coughing up blood, depression-new, fainting, fatigue, fever, headaches-new, hearing problems, heart murmur, heart rhythm changes, itching, menstrual pain, muscle pains/cramps, night sweats, nosebleeds, pregnancy symptoms, shortness of breath, skin rash, sleeping problems, sore throat, swelling of feet/legs, swollen lymph glands, thirst - excessive , urination - excessive , urination changes/pain, urine leakage, vision changes, and voice change.    Vital Signs:  Patient profile:   77 year old male Height:      68 inches Weight:      197.38 pounds BMI:     30.12 BSA:     2.03 Pulse rate:   56 / minute Pulse rhythm:  regular BP sitting:   122 / 60  (left arm)  Vitals Entered By: Madlyn Frankel CMA Deborra Medina) (November 25, 2009 8:17 AM)  Physical Exam  Additional Exam:  On physical exam he is a healthy-appearing male  skin: anicteric HEENT: normocephalic; PEERLA; no nasal or  pharyngeal abnormalities neck: supple nodes: no cervical lymphadenopathy chest: clear to ausculatation and percussion heart: no murmurs, gallops, or rubs abd: soft, nontender; BS normoactive; no abdominal masses, tenderness, organomegaly rectal: deferred ext: no cynanosis, clubbing, edema skeletal: no deformities neuro: oriented x 3; no focal abnormalities    Impression & Recommendations:  Problem # 1:  PERSONAL HISTORY OF COLONIC POLYPS (ICD-V12.72)  Plan followup colonoscopy  Risks, alternatives, and complications of the procedure, including bleeding, perforation, and possible need for surgery, were explained to the patient.  Patient's questions were answered.  Orders: Colonoscopy (Colon)  Problem # 2:  CORONARY ARTERY DISEASE (ICD-414.00) Assessment: Comment Only  Problem # 3:  COPD (ICD-496) Assessment: Comment Only  Patient Instructions: 1)  Copy sent to : Walker Kehr, MD 2)  Your Colonoscopy is scheduled for 12/29/2009 at 2:30pm 3)  You can pick up yoour MoviPrep today 4)  Colonoscopy and Flexible Sigmoidoscopy brochure given.  5)  Conscious Sedation brochure given.  6)  The medication list was reviewed and reconciled.  All changed / newly prescribed medications were explained.  A complete medication list was provided to the patient / caregiver. Prescriptions: MOVIPREP 100 GM  SOLR (PEG-KCL-NACL-NASULF-NA ASC-C) As per prep instructions.  #1 x 0   Entered by:   Genella Mech CMA ([place])   Authorized by:   Inda Castle MD   Signed by:   Genella Mech CMA ([place]) on 11/25/2009   Method used:   Electronically to        [id number] (retail)       [address] [place] [address]       [place], [place]  [address]       Ph: [phone] or [phone]       Fax: [phone]   RxID:   [id number]

## 2010-05-26 NOTE — Procedures (Signed)
Summary: colonoscopy   Colonoscopy  Procedure date:  10/17/2004  Findings:      Results: Diverticulosis.       Location:  [facility].    Comments:      Repeat colonoscopy in 5 years.  Patient Name: Allen Holloway, Allen Holloway MRN:  Procedure Procedures: Colonoscopy CPT: 425 [id number].  Personnel: Endoscopist: Sandy Salaam. Deatra Ina, MD.  Patient Consent: Procedure, Alternatives, Risks and Benefits discussed, consent obtained, from patient.  Indications  Surveillance of: Adenomatous Polyp(s).  History  Current Medications: Patient is not currently taking Coumadin.  Pre-Exam Physical: Performed Oct 17, 2004. Cardio-pulmonary exam, HEENT exam , Abdominal exam, Mental status exam WNL.  Exam Exam: Extent of exam reached: Cecum, extent intended: Cecum.  The cecum was identified by IC valve. Colon retroflexion performed. ASA Classification: II. Tolerance: good.  Monitoring: Pulse and BP monitoring, Oximetry used. Supplemental O2 given. at 2 Liters.  Colon Prep Used Miralax for colon prep. Prep results: good.  Sedation Meds: Patient assessed and found to be appropriate for moderate (conscious) sedation. Sedation was managed by the Endoscopist. Fentanyl 50 mcg. given IV. Versed 5 mg. given IV.  Findings - DIVERTICULOSIS: Descending Colon to Sigmoid Colon. ICD9: Diverticulosis: 562.10. Comments: Diffuse diveticular changes.  NORMAL EXAM: Cecum.  OTHER FINDING: Anal tag found in Rectum.   Assessment Abnormal examination, see findings above.  Diagnoses: 562.10: Diverticulosis.   Events  Unplanned Interventions: No intervention was required.  Unplanned Events: There were no complications. Plans  Post Exam Instructions: Post sedation instructions given.  Patient Education: Patient given standard instructions for: Diverticulosis.  Disposition: After procedure patient sent to recovery. After recovery patient sent home.  Scheduling/Referral: Colonoscopy, to Sandy Salaam.  Deatra Ina, MD, around Oct 17, 2009.    This report was created from the original endoscopy report, which was reviewed and signed by the above listed endoscopist.

## 2010-05-26 NOTE — Procedures (Signed)
Summary: Colonoscopy  Patient: Allen Holloway Note: All result statuses are Final unless otherwise noted.  Tests: (1) Colonoscopy (COL)   COL Colonoscopy           [facility].     [place], [place]  [address]           COLONOSCOPY PROCEDURE REPORT           PATIENT:  [name], [name]  MR#:  [id number]     BIRTHDATE:  December 26, 1926, 83 yrs. old  GENDER:  male           ENDOSCOPIST:  Sandy Salaam. Deatra Ina, MD     Referred by:           PROCEDURE DATE:  12/29/2009     PROCEDURE:  Diagnostic Colonoscopy     ASA CLASS:  Class II     INDICATIONS:  1) screening  2) history of pre-cancerous     (adenomatous) colon polyps           MEDICATIONS:   Fentanyl 50 mcg IV, Versed 5 mg IV           DESCRIPTION OF PROCEDURE:   After the risks benefits and     alternatives of the procedure were thoroughly explained, informed     consent was obtained.  Digital rectal exam was performed and     revealed no abnormalities.   The LB [id number] endoscope     was introduced through the anus and advanced to the cecum, which     was identified by the ileocecal valve, without limitations.  The     quality of the prep was excellent, using MoviPrep.  The instrument     was then slowly withdrawn as the colon was fully examined.     <<PROCEDUREIMAGES>>           FINDINGS:  Scattered diverticula were found. Diffuse     diverticulosis extending from sigmoid to cecum (see image1,     image2, image13, and image15). Moderate diverticular changes in     left colon with mild lumenal narrowing and muscular hypertrophy     This was otherwise a normal examination of the colon (see image2,     image3, image5, image6, image9, image10, image16, and image17).     Retroflexed views in the rectum revealed no abnormalities.    The     time to cecum =  1.75  minutes. The scope was then withdrawn (time     =  6.25  min) from the patient and the procedure completed.           COMPLICATIONS:   None           ENDOSCOPIC IMPRESSION:     1) Diverticula, scattered     2) Otherwise normal examination     RECOMMENDATIONS:     1) Return to the care of your primary provider. GI follow up as     needed           REPEAT EXAM:  No           ______________________________     Sandy Salaam. Deatra Ina, MD           CC: Altamese Greentop. Plotnikov, MD           n.     Lorrin MaisSandy Salaam. Kennady Zimmerle at 12/29/2009 03:14 PM  Joshuea, [place], [id number]  Note: An exclamation mark (!) indicates a result that was not dispersed into the flowsheet. Document Creation Date: 12/29/2009 3:14 PM _______________________________________________________________________  (1) Order result status: Final Collection or observation date-time: 12/29/2009 15:00 Requested date-time:  Receipt date-time:  Reported date-time:  Referring Physician:   Ordering Physician: Erskine Emery [phone]) Specimen Source:  Source: Tawanna Cooler Order Number: [id number] Lab site:

## 2010-05-26 NOTE — Assessment & Plan Note (Signed)
Summary: 3 mth fu--stc   Vital Signs:  Patient profile:   77 year old male Height:      68 inches Weight:      196 pounds BMI:     29.91 Temp:     96.5 degrees F oral Pulse rate:   60 / minute Pulse rhythm:   regular Resp:     16 per minute BP sitting:   130 / 70  (left arm) Cuff size:   regular  Vitals Entered By: Jonathon Resides, CMA(AAMA) (January 05, 2010 8:12 AM) CC: 3 mo f/u Is Patient Diabetic? No   Primary Care Provider:  Walker Kehr, MD  CC:  3 mo f/u.  History of Present Illness: The patient presents for a follow up of hypertension, CAD, carotid disease, hyperlipidemia   Current Medications (verified): 1)  Omeprazole 20 Mg Cpdr (Omeprazole) .... Take 2 Tabs Daily 2)  Levothroid 100 Mcg Tabs (Levothyroxine Sodium) .Marland Kitchen.. 1 By Mouth Once Daily 3)  Simvastatin 80 Mg Tabs (Simvastatin) .... 1/2  Once Daily 4)  Aspirin 81 Mg Tabs (Aspirin) .... Once Daily 5)  Vitamin D3 1000 Unit  Tabs (Cholecalciferol) .Marland Kitchen.. 1 Qd 6)  Toprol Xl 25 Mg Xr24h-Tab (Metoprolol Succinate) .... One Daily 7)  Triamcinolone Acetonide 0.1 % Oint (Triamcinolone Acetonide) .... Use 1-2 Times A Day 8)  Hydrochlorothiazide 12.5 Mg Caps (Hydrochlorothiazide) .Marland Kitchen.. 1 By Mouth Once Daily For Blood Pressure  Allergies (verified): 1)  Lotensin (Benazepril Hcl)  Past History:  Past Medical History: Last updated: 11/22/2009 1.  COPD 2.  Coronary artery disease: CABG 2005 with LIMA-LAD, SVG-PDA, SVG-D1, SVG-OM.  Myoview (9/08) showed EF 63%, no scar or ischemia.  3.  Hyperlipidemia 4.  Hypertension 5.  Carotid bruit 6.  Cholelithiasis 7.  CKD 8.  Low back pain 9.  Osteoarthritis 10. GERD 11. Hypothyroidism Gets wellness at [facility] q 1 y Diverticulosis  Past Surgical History: Last updated: 11/25/2009 Coronary artery bypass graft 2005 Appendectomy  Social History: Last updated: 11/25/2009 Retired Married Former Smoker-stopped 20-30 years ago Alcohol Use - no Daily Caffeine Use-4 cups  daily Illicit Drug Use - no Patient gets regular exercise.  Review of Systems  The patient denies weight loss, chest pain, syncope, dyspnea on exertion, and abdominal pain.    Physical Exam  General:  pleasant and cooperative Nose:  External nasal examination shows no deformity or inflammation. Nasal mucosa are pink and moist without lesions or exudates. Mouth:  Oral mucosa and oropharynx without lesions or exudates.  Teeth in good repair. Neck:  WNL Chest Wall:  No deformities, masses, tenderness or gynecomastia noted. Lungs:  Normal respiratory effort, chest expands symmetrically. Lungs are clear to auscultation, no crackles or wheezes. Heart:  Normal rate and regular rhythm. S1 and S2 normal without gallop,  click, rub or other extra sounds. systolic heart murmur 2/6 Abdomen:  soft, non-tender with hypoactive BS, no organomegalies, no masses Msk:  No tenderness of coccyx, normal gait R SI tender Neurologic:  No cranial nerve deficits noted. Station and gait are normal. Plantar reflexes are down-going bilaterally. DTRs are symmetrical throughout. Sensory, motor and coordinative functions appear intact. Slight ataxia Skin:  Intact without suspicious lesions or rashes   Impression & Recommendations:  Problem # 1:  CORONARY ARTERY DISEASE (ICD-414.00) Assessment Unchanged  His updated medication list for this problem includes:    Aspirin 81 Mg Tabs (Aspirin) ..... Once daily    Toprol Xl 25 Mg Xr24h-tab (Metoprolol succinate) ..... One daily  Hydrochlorothiazide 12.5 Mg Caps (Hydrochlorothiazide) .Marland Kitchen... 1 by mouth once daily for blood pressure  Problem # 2:  CAROTID ARTERY STENOSIS, BILATERAL (ICD-433.10) Assessment: Unchanged  His updated medication list for this problem includes:    Aspirin 81 Mg Tabs (Aspirin) ..... Once daily  Problem # 3:  BACK PAIN, LUMBAR (ICD-724.2) Assessment: Improved  His updated medication list for this problem includes:    Aspirin 81 Mg Tabs  (Aspirin) ..... Once daily  Problem # 4:  RENAL INSUFFICIENCY (ICD-588.9) Assessment: Unchanged The labs were reviewed with the patient.   Complete Medication List: 1)  Omeprazole 20 Mg Cpdr (Omeprazole) .... Take 2 tabs daily 2)  Levothroid 100 Mcg Tabs (Levothyroxine sodium) .Marland Kitchen.. 1 by mouth once daily 3)  Simvastatin 80 Mg Tabs (Simvastatin) .... 1/2  once daily 4)  Aspirin 81 Mg Tabs (Aspirin) .... Once daily 5)  Vitamin D3 1000 Unit Tabs (Cholecalciferol) .Marland Kitchen.. 1 qd 6)  Toprol Xl 25 Mg Xr24h-tab (Metoprolol succinate) .... One daily 7)  Triamcinolone Acetonide 0.1 % Oint (Triamcinolone acetonide) .... Use 1-2 times a day 8)  Hydrochlorothiazide 12.5 Mg Caps (Hydrochlorothiazide) .Marland Kitchen.. 1 by mouth once daily for blood pressure  Patient Instructions: 1)  Please schedule a follow-up appointment in 6 months. 2)  BMP prior to visit, ICD-9: 3)  Hepatic Panel prior to visit, ICD-9: 4)  Lipid Panel prior to visit, ICD-9:585  401.1  272.20
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
## 2010-05-26 NOTE — Procedures (Signed)
Summary: Upper Endoscopy/[facility]  Upper Endoscopy/ [facility]   Imported By: Phillis Knack 11/25/2009 14:30:29  _____________________________________________________________________  External Attachment:    Type:   Image     Comment:   External Document

## 2010-05-26 NOTE — Letter (Signed)
Summary: [facility] Consult Scheduled Letter  [facility]   [place], [place] [address]   Phone: [phone]  Fax: [phone]      10/06/2009 MRN: [id number]  [facility] [address] [place], [place]  [address]    Dear [name],      We have scheduled an appointment for you.  At the recommendation of Dr.Plotnikov, we have scheduled you a consult with Dr. Aundra Dubin [facility]) on July 12,2011 Tue at 3:00Pm.Their phone number is [phone]. If this appointment day and time is not convenient for you, please feel free to call the office of the doctor you are being referred to at the number listed above and reschedule the appointment.   [facility]-3RD [place] [address] [place]  Thank you,  Patient Care Coordinator [facility]

## 2010-05-26 NOTE — Assessment & Plan Note (Signed)
Summary: 3 mos f/u #/cd   Vital Signs:  Patient profile:   77 year old male Weight:      194 pounds BMI:     29.60 Temp:     97.7 degrees F oral Pulse rate:   50 / minute BP sitting:   150 / 82  (left arm)  Vitals Entered By: Doralee Albino (June 14, 2009 8:08 AM) CC: f/u Is Patient Diabetic? No   Primary Care Provider:  Cassandria Anger MD  CC:  f/u.  History of Present Illness: The patient presents for a follow up of hypertension, OA, hyperlipidemia   Preventive Screening-Counseling & Management  Alcohol-Tobacco     Smoking Status: quit  Current Medications (verified): 1)  Omeprazole 20 Mg Cpdr (Omeprazole) .... Take 2 Tabs Daily 2)  Levothroid 100 Mcg Tabs (Levothyroxine Sodium) .Marland Kitchen.. 1 By Mouth Once Daily 3)  Simvastatin 80 Mg Tabs (Simvastatin) .... 1/2  Once Daily 4)  Aspirin 81 Mg Tabs (Aspirin) .... Once Daily 5)  Vitamin D3 1000 Unit  Tabs (Cholecalciferol) .Marland Kitchen.. 1 Qd 6)  Toprol Xl 50 Mg Xr24h-Tab (Metoprolol Succinate) .... 1/2 By Mouth Bid 7)  Triamcinolone Acetonide 0.1 % Oint (Triamcinolone Acetonide) .... Use 1-2 Times A Day  Allergies: 1)  Lotensin (Benazepril Hcl)  Past History:  Past Surgical History: Last updated: 04/12/2007 Coronary artery bypass graft 2005  Family History: Last updated: 04/12/2007 Family History High cholesterol Family History Hypertension  Social History: Last updated: 04/12/2007 Retired Married Former Smoker  Past Medical History: COPD Coronary artery disease Hyperlipidemia Hypertension Peripheral vascular disease Cholelithiasis Renal insufficiency Low back pain Osteoarthritis GERD Hypothyroidism Gets wellness at [place] q 1 y  Physical Exam  General:  pleasant and cooperative Nose:  External nasal examination shows no deformity or inflammation. Nasal mucosa are pink and moist without lesions or exudates. Mouth:  Oral mucosa and oropharynx without lesions or exudates.  Teeth in good repair. 4 mm white R  upper lip papule Lungs:  clear bilaterally, no wheezes, rhonchi or crackels Heart:  irregular rhythm, no murmurs, rugs or gallops Abdomen:  soft, non-tender with hypoactive BS, no organomegalies, no masses Msk:  tenderness of coccyx, normal gait Neurologic:  No cranial nerve deficits noted. Station and gait are normal. Plantar reflexes are down-going bilaterally. DTRs are symmetrical throughout. Sensory, motor and coordinative functions appear intact. Skin:  Intact without suspicious lesions or rashes Psych:  Cognition and judgment appear intact. Alert and cooperative with normal attention span and concentration. No apparent delusions, illusions, hallucinations   Impression & Recommendations:  Problem # 1:  HYPERKALEMIA (ICD-276.7) Assessment Improved  Problem # 2:  LOW BACK PAIN (ICD-724.2) Assessment: Unchanged  His updated medication list for this problem includes:    Aspirin 81 Mg Tabs (Aspirin) ..... Once daily  Problem # 3:  HYPOTHYROIDISM (ICD-244.9) Assessment: Unchanged  His updated medication list for this problem includes:    Levothroid 100 Mcg Tabs (Levothyroxine sodium) .Marland Kitchen... 1 by mouth once daily  Problem # 4:  HYPERTENSION (ICD-401.9) Assessment: Deteriorated  His updated medication list for this problem includes:    Toprol Xl 50 Mg Xr24h-tab (Metoprolol succinate) .Marland Kitchen... 1/2 by mouth bid    Hydrochlorothiazide 12.5 Mg Caps (Hydrochlorothiazide) .Marland Kitchen... 1 by mouth once daily for blood pressure  Problem # 5:  LESION, LIP (ICD-210.0) R upper Assessment: Comment Only He will address it at [facility]  Complete Medication List: 1)  Omeprazole 20 Mg Cpdr (Omeprazole) .... Take 2 tabs daily 2)  Levothroid 100 Mcg  Tabs (Levothyroxine sodium) .Marland Kitchen.. 1 by mouth once daily 3)  Simvastatin 80 Mg Tabs (Simvastatin) .... 1/2  once daily 4)  Aspirin 81 Mg Tabs (Aspirin) .... Once daily 5)  Vitamin D3 1000 Unit Tabs (Cholecalciferol) .Marland Kitchen.. 1 qd 6)  Toprol Xl 50 Mg Xr24h-tab (Metoprolol  succinate) .... 1/2 by mouth bid 7)  Triamcinolone Acetonide 0.1 % Oint (Triamcinolone acetonide) .... Use 1-2 times a day 8)  Hydrochlorothiazide 12.5 Mg Caps (Hydrochlorothiazide) .Marland Kitchen.. 1 by mouth once daily for blood pressure  Other Orders: Tdap => 42yrs IM [id number]) Admin 1st Vaccine [phone]) Admin 1st Vaccine [facility]) [id number])  Patient Instructions: 1)  Please schedule a follow-up appointment in 3 months  2)  BMP prior to visit, ICD-9:995.20 Prescriptions: HYDROCHLOROTHIAZIDE 12.5 MG CAPS (HYDROCHLOROTHIAZIDE) 1 by mouth once daily for blood pressure  #30 x 12   Entered and Authorized by:   Cassandria Anger MD   Signed by:   Cassandria Anger MD on 06/14/2009   Method used:   Print then Give to Patient   RxID:   [id number]    Tetanus/Td Vaccine    Vaccine Type: Tdap    Site: left deltoid    Mfr: [employer]    Dose: 0.5 ml    Route: IM    Given by: Doralee Albino    Exp. Date: 06/19/2011    Lot #: [id number]    VIS given: 03/12/07 version given June 14, 2009.

## 2010-07-07 ENCOUNTER — Encounter: Payer: Self-pay | Admitting: Internal Medicine

## 2010-07-07 ENCOUNTER — Ambulatory Visit (INDEPENDENT_AMBULATORY_CARE_PROVIDER_SITE_OTHER): Payer: Medicare Other | Admitting: Internal Medicine

## 2010-07-07 ENCOUNTER — Other Ambulatory Visit: Payer: Medicare Other

## 2010-07-07 ENCOUNTER — Other Ambulatory Visit: Payer: Self-pay | Admitting: Internal Medicine

## 2010-07-07 DIAGNOSIS — I6529 Occlusion and stenosis of unspecified carotid artery: Secondary | ICD-10-CM

## 2010-07-07 DIAGNOSIS — I10 Essential (primary) hypertension: Secondary | ICD-10-CM

## 2010-07-07 DIAGNOSIS — E039 Hypothyroidism, unspecified: Secondary | ICD-10-CM

## 2010-07-07 DIAGNOSIS — I2510 Atherosclerotic heart disease of native coronary artery without angina pectoris: Secondary | ICD-10-CM

## 2010-07-07 LAB — BASIC METABOLIC PANEL
BUN: 37 mg/dL — ABNORMAL HIGH (ref 6–23)
CO2: 29 mEq/L (ref 19–32)
Calcium: 10.3 mg/dL (ref 8.4–10.5)
Chloride: 104 mEq/L (ref 96–112)
Creatinine, Ser: 1.9 mg/dL — ABNORMAL HIGH (ref 0.4–1.5)
GFR: 36.92 mL/min — ABNORMAL LOW (ref >60.00)
Glucose, Bld: 116 mg/dL — ABNORMAL HIGH (ref 70–99)
Potassium: 4.4 mEq/L (ref 3.5–5.1)
Sodium: 141 mEq/L (ref 135–145)

## 2010-07-12 NOTE — Assessment & Plan Note (Signed)
Summary: 6 MO FU #/ CD   Vital Signs:  Patient profile:   77 year old male Height:      68 inches Weight:      197 pounds BMI:     30.06 Temp:     97.7 degrees F oral Pulse rate:   64 / minute Pulse rhythm:   regular Resp:     16 per minute BP sitting:   150 / 92  (left arm) Cuff size:   regular  Vitals Entered By: Jonathon Resides, CMA(AAMA) (July 07, 2010 8:02 AM) CC: 6 mo f/u Is Patient Diabetic? No   Primary Care Provider:  Walker Kehr, MD  CC:  6 mo f/u.  History of Present Illness: The patient presents for a follow up of hypertension, CAD, hyperlipidemia   Preventive Screening-Counseling & Management  Caffeine-Diet-Exercise     Does Patient Exercise: yes  Current Medications (verified): 1)  Omeprazole 20 Mg Cpdr (Omeprazole) .... Take 2 Tabs Daily 2)  Levothroid 100 Mcg Tabs (Levothyroxine Sodium) .Marland Kitchen.. 1 By Mouth Once Daily 3)  Simvastatin 80 Mg Tabs (Simvastatin) .... 1/2  Once Daily 4)  Aspirin 81 Mg Tabs (Aspirin) .... Once Daily 5)  Vitamin D3 1000 Unit  Tabs (Cholecalciferol) .Marland Kitchen.. 1 Qd 6)  Toprol Xl 25 Mg Xr24h-Tab (Metoprolol Succinate) .... One Daily 7)  Triamcinolone Acetonide 0.1 % Oint (Triamcinolone Acetonide) .... Use 1-2 Times A Day 8)  Hydrochlorothiazide 12.5 Mg Caps (Hydrochlorothiazide) .Marland Kitchen.. 1 By Mouth Once Daily For Blood Pressure  Allergies (verified): 1)  Lotensin (Benazepril Hcl)  Past History:  Past Medical History: Last updated: 11/22/2009 1.  COPD 2.  Coronary artery disease: CABG 2005 with LIMA-LAD, SVG-PDA, SVG-D1, SVG-OM.  Myoview (9/08) showed EF 63%, no scar or ischemia.  3.  Hyperlipidemia 4.  Hypertension 5.  Carotid bruit 6.  Cholelithiasis 7.  CKD 8.  Low back pain 9.  Osteoarthritis 10. GERD 11. Hypothyroidism Gets wellness at [facility] q 1 y Diverticulosis  Social History: Retired Married Former Smoker-stopped 20-30 years ago Alcohol Use - no Daily Caffeine Use-4 cups daily Illicit Drug Use - no Patient gets  regular exercise. Regular exercise-yes GYM  Review of Systems  The patient denies fever, chest pain, dyspnea on exertion, and prolonged cough.         BP is nl at home  Physical Exam  General:  pleasant and cooperative Head:  Normocephalic and atraumatic without obvious abnormalities. No apparent alopecia or balding. Nose:  External nasal examination shows no deformity or inflammation. Nasal mucosa are pink and moist without lesions or exudates. Mouth:  Oral mucosa and oropharynx without lesions or exudates.  Teeth in good repair. Lungs:  Normal respiratory effort, chest expands symmetrically. Lungs are clear to auscultation, no crackles or wheezes. Heart:  Normal rate and regular rhythm. S1 and S2 normal without gallop,  click, rub or other extra sounds. systolic heart murmur 2/6 Abdomen:  soft, non-tender with hypoactive BS, no organomegalies, no masses Msk:  No tenderness of coccyx, normal gait R SI tender Neurologic:  No cranial nerve deficits noted. Station and gait are normal. Plantar reflexes are down-going bilaterally. DTRs are symmetrical throughout. Sensory, motor and coordinative functions appear intact. Slight ataxia Skin:  Intact without suspicious lesions or rashes Psych:  Cognition and judgment appear intact. Alert and cooperative with normal attention span and concentration. No apparent delusions, illusions, hallucinations   Impression & Recommendations:  Problem # 1:  CAROTID ARTERY STENOSIS, BILATERAL (ICD-433.10) Assessment Unchanged  His updated  medication list for this problem includes:    Aspirin 81 Mg Tabs (Aspirin) ..... Once daily  Problem # 2:  HYPOTHYROIDISM (ICD-244.9) Assessment: Unchanged  His updated medication list for this problem includes:    Levothroid 100 Mcg Tabs (Levothyroxine sodium) .Marland Kitchen... 1 by mouth once daily  Problem # 3:  HYPERTENSION (ICD-401.9) Assessment: Unchanged  Nl BP at home His updated medication list for this problem  includes:    Toprol Xl 25 Mg Xr24h-tab (Metoprolol succinate) ..... One daily    Hydrochlorothiazide 12.5 Mg Caps (Hydrochlorothiazide) .Marland Kitchen... 1 by mouth once daily for blood pressure  Orders: TLB-BMP (Basic Metabolic Panel-BMET) (99991111)  Problem # 4:  RENAL INSUFFICIENCY (ICD-588.9) Assessment: Unchanged  Complete Medication List: 1)  Omeprazole 20 Mg Cpdr (Omeprazole) .... Take 2 tabs daily 2)  Levothroid 100 Mcg Tabs (Levothyroxine sodium) .Marland Kitchen.. 1 by mouth once daily 3)  Simvastatin 80 Mg Tabs (Simvastatin) .... 1/2  once daily 4)  Aspirin 81 Mg Tabs (Aspirin) .... Once daily 5)  Vitamin D3 1000 Unit Tabs (Cholecalciferol) .Marland Kitchen.. 1 qd 6)  Toprol Xl 25 Mg Xr24h-tab (Metoprolol succinate) .... One daily 7)  Triamcinolone Acetonide 0.1 % Oint (Triamcinolone acetonide) .... Use 1-2 times a day 8)  Hydrochlorothiazide 12.5 Mg Caps (Hydrochlorothiazide) .Marland Kitchen.. 1 by mouth once daily for blood pressure  Patient Instructions: 1)  Please schedule a follow-up appointment in 4 months well w/labs.   Orders Added: 1)  TLB-BMP (Basic Metabolic Panel-BMET) [id number] 2)  Est. Patient Level IV [id number]

## 2010-07-14 ENCOUNTER — Other Ambulatory Visit: Payer: Self-pay | Admitting: *Deleted

## 2010-07-14 MED ORDER — HYDROCHLOROTHIAZIDE 25 MG PO TABS: 12.5000 mg | ORAL_TABLET | Freq: Every day | ORAL | Status: DC

## 2010-09-06 NOTE — Assessment & Plan Note (Signed)
[place] OFFICE NOTE   NAME:HARRELLTrendell, Leahy                      MRN:          [id number]  DATE:12/28/2006                            DOB:          09-12-1926    PRIMARY CARE PHYSICIAN:  Evie Lacks. Plotnikov, M.D.   REASON FOR VISIT:  Cardiac followup.   HISTORY OF PRESENT ILLNESS:  I saw Mr. Rupe in September 2007.  He  reports doing very well without any angina or limiting dyspnea.  He  stays very active doing outdoor work in the garden and reports  tolerating his medicines otherwise.  Lipids are being followed by Dr.  Alain Marion.  His electrocardiogram today shows sinus bradycardia with a  prolonged PR interval and leftward axis, relatively stable compared to  the previous tracing with the exception of an increase in the PR  interval.  He has had no palpitations, dizziness, or syncope.  Bypass  surgery was in 2004, and he is due for a followup Myoview at this time.   ALLERGIES:  No known drug allergies.   PRESENT MEDICATIONS:  1. Omeprazole 20 mg p.o. daily.  2. Levothyroxine 0.1 mg p.o. daily.  3. Simvastatin 40 mg p.o. q.h.s.  4. Metoprolol 25 mg, 1/4 tablet p.o. b.i.d.  5. Benazepril 5 mg p.o. daily.  6. Enteric coated aspirin 325 mg p.o. daily.   REVIEW OF SYSTEMS:  As described in the history of present illness.   PHYSICAL EXAMINATION:  VITAL SIGNS:  Blood pressure 118/72, heart rate  is 54, weight is 178 pounds.  GENERAL:  The patient is comfortable and in no acute distress.  HEENT:  Conjunctivae and lids are normal.  Pharynx is clear.  NECK:  Supple without elevated jugular venous pressure.  No loud bruits.  No thyromegaly is noted.  LUNGS:  Clear.  No rales or wheezing.  CARDIAC:  Reveals a regular rate and rhythm.  No S3 gallop or loud  systolic murmur.  ABDOMEN;  Soft.  No bruits.  Bowel sounds prescription.  EXTREMITIES:  Exhibit no pitting edema.  SKIN:  Warm and dry.  Distal  pulses are 2 plus.   IMPRESSION/RECOMMENDATION:  1. Coronary artery disease, status post coronary artery bypass      grafting in 2004.  I plan a followup adenosine Myoview on medical      therapy and otherwise if this is low risk, we will anticipate      continuing yearly visits.  He has had an increase in his PR      interval on electrocardiogram which is presumably asymptomatic.  He      is on very low dose beta-blocker therapy and this could always be      discontinued if the patient was noted to have problems with      progressive bradycardia or fatigue.  2. Mr. Troeger was also scheduled for carotid Doppler studies later      today by Dr. Alain Marion for routine followup.  3. Hyperlipidemia, on Zocor.  Goal LDL should be around 70.     Satira Sark, MD  Electronically Signed    SGM/MedQ  DD: 12/28/2006  DT: 12/28/2006  Job #: [id number]   cc:   Evie Lacks. Plotnikov, MD

## 2010-09-09 NOTE — Procedures (Signed)
[place]                                  PROCEDURE NOTE   NAME:Allen Holloway, Allen Holloway                      MRN:          [id number]  DATE:12/04/2005                            DOB:          [birth year]    PROCEDURE:  Recurrent left elbow bursitis.  Risks including incomplete  procedure, recurrence, infection, bleeding and others were explained to the  patient in detail.  He agreed to proceed.  He was placed in the decubitus  position.  The area was prepped with Betadine and alcohol.  Local is 0.5 cc  with 2% lidocaine with epinephrine performed.  Then the bursa was entered  with 20-gauge needle and 10 cc of semi-transparent bloody fluid was  extracted and discarded.  The bursa was injected with 80 mg of Depo-Medrol  and 1 cc of 2% lidocaine, tolerated well.   COMPLICATIONS:  None.   Ace wrap applied.  Instructions provided.   If continues to recur, would obtain Ortho consult for bursa removal.                                   Walker Kehr, MD   AP/MedQ  DD:  12/04/2005  DT:  12/05/2005  Job #:  [id number]

## 2010-09-09 NOTE — Assessment & Plan Note (Signed)
[facility]                             PRIMARY CARE OFFICE NOTE   NAME:Allen Holloway, Allen Holloway                      MRN:          [id number]  DATE:03/06/2006                            DOB:          15-Jul-1926    PROCEDURE:  Left elbow bursa drainage and steroid injection.   INDICATIONS:  Recurrent pain with bursitis.  Risks including unsuccessful  procedure, bleeding, infection and others as well as the benefits were  explained to the patient in detail.  He wanted to proceed.  He was placed in  the decubitus position.  The area was prepped with Betadine and alcohol.  Skin injected with 0.5 mL of 1% Xylocaine with epinephrine.  An 18 gauge  needle was introduced into the cavity of bursa and 3 mL of serosanguineous  fluid was drawn and discarded.   A 28 gauge needle was introduced and 40 mg of Depo-Medrol and then 2 mL of  2% lidocaine was injected in the cavity.  He tolerated it well.   COMPLICATIONS:  None.   Band-aid applied.  The patient is aware that if the problems keeps coming  back he needs to see an orthopedic surgeon for a potential bursa removal.     Evie Lacks. Plotnikov, MD  Electronically Signed    AVP/MedQ  DD: 03/06/2006  DT: 03/07/2006  Job #: [id number]

## 2010-09-09 NOTE — Assessment & Plan Note (Signed)
[place] OFFICE NOTE   NAME:HARRELLEoghan, Salmo                      MRN:          [id number]  DATE:01/17/2006                            DOB:          1926/08/24    PRIMARY CARE PHYSICIAN:  Evie Lacks. Plotnikov, MD.   REASON FOR VISIT:  Routine cardiac followup.   HISTORY OF PRESENT ILLNESS:  Mr. Hebel returns for an annual visit.  He  has a history of multivessel coronary artery disease, status post coronary  artery bypass grafting in 2004.  He continues to do quite well.  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  He states that he has been  working in the yard regularly, and also riding a stationary bicycle without  any angina or shortness of breath.  His electrocardiogram is stable today,  showing sinus bradycardia at 48 beats per minute, with a prolonged PR  interval of 218 msec, which is actually less than his last tracing at 232  msec.  Blood work from April revealed an LDL cholesterol of 56 and an HDL  cholesterol of 70.  He is tolerating his medicines without any difficulties.  We talked about continuing risk factor modification, and I have encouraged  him to remain active, although to be careful working in the heat.   ALLERGIES:  No known drug allergies.   MEDICATIONS:  1. Prilosec 20 mg p.o. daily.  2. Levothyroxine 0.1 mg p.o. daily.  3. Zocor 40 mg p.o. nightly.  4. Metoprolol 6.25 mg p.o. b.i.d.  5. Benazepril 10 mg one-half tablet p.o. daily.  6. Enteric-coated aspirin 325 mg p.o. daily.   REVIEW OF SYSTEMS:  As described in history of present illness.  He is not  having any bleeding problems, claudication, orthopnea, PND or syncope.   PHYSICAL EXAMINATION:  Blood pressure 128/70, heart rate is 50 and regular,  weight is 161 pounds, which is down from 185 pounds.  The patient is comfortable, in no acute distress.  HEENT:  Conjunctivae and lids are normal, oropharynx is  clear.  NECK:  Supple without elevated jugular venous pressure, no loud bruits, no  thyromegaly is noted.  LUNGS:  Clear without labored breathing.  CARDIAC:  Exam reveals a regular rate and rhythm without loud murmur or  gallop.  ABDOMEN:  Reveals no loud bruit.  Bowel sounds are present.  EXTREMITIES:  Show no pitting edema.   IMPRESSION AND RECOMMENDATIONS:  1. Multivessel coronary artery disease, status post coronary artery bypass      grafting in 2004.  The patient is doing well on medical therapy at this      time, and I will make no major adjustments.  He has lost almost 25      pounds since his last visit, and tells me that he has actually been      watching what he eats a bit more carefully since he retired.  I have      asked him to keep an eye on this.  He will otherwise continue to follow  up with Dr. Alain Marion, and we will see him back in one year's time,      assuming he does not develop any new symptoms.  2. Hyperlipidemia with HDL and LDL cholesterol at goal.  3. Hypertension, well controlled.  4. Chronic sinus bradycardia that is asymptomatic.  The patient is on very      low dose beta blocker therapy.  Could consider discontinuing this      altogether.       Satira Sark, MD     SGM/MedQ  DD:  01/17/2006  DT:  01/19/2006  Job #:  [id number]   cc:   Evie Lacks. Plotnikov, MD

## 2010-09-29 ENCOUNTER — Other Ambulatory Visit: Payer: Self-pay | Admitting: *Deleted

## 2010-09-29 MED ORDER — HYDROCHLOROTHIAZIDE 25 MG PO TABS: 12.5000 mg | ORAL_TABLET | Freq: Every day | ORAL | Status: DC

## 2010-11-01 ENCOUNTER — Encounter: Payer: Self-pay | Admitting: Internal Medicine

## 2010-11-01 ENCOUNTER — Other Ambulatory Visit: Payer: Self-pay | Admitting: Internal Medicine

## 2010-11-01 ENCOUNTER — Other Ambulatory Visit: Payer: Medicare Other

## 2010-11-01 DIAGNOSIS — Z0000 Encounter for general adult medical examination without abnormal findings: Secondary | ICD-10-CM

## 2010-11-01 DIAGNOSIS — Z0389 Encounter for observation for other suspected diseases and conditions ruled out: Secondary | ICD-10-CM

## 2010-11-03 ENCOUNTER — Encounter: Payer: Self-pay | Admitting: Internal Medicine

## 2010-11-08 ENCOUNTER — Encounter: Payer: Self-pay | Admitting: Internal Medicine

## 2010-11-08 ENCOUNTER — Ambulatory Visit (INDEPENDENT_AMBULATORY_CARE_PROVIDER_SITE_OTHER): Payer: Medicare Other | Admitting: Internal Medicine

## 2010-11-08 DIAGNOSIS — I739 Peripheral vascular disease, unspecified: Secondary | ICD-10-CM

## 2010-11-08 DIAGNOSIS — R739 Hyperglycemia, unspecified: Secondary | ICD-10-CM | POA: Insufficient documentation

## 2010-11-08 DIAGNOSIS — N259 Disorder resulting from impaired renal tubular function, unspecified: Secondary | ICD-10-CM

## 2010-11-08 DIAGNOSIS — I6529 Occlusion and stenosis of unspecified carotid artery: Secondary | ICD-10-CM

## 2010-11-08 DIAGNOSIS — R252 Cramp and spasm: Secondary | ICD-10-CM

## 2010-11-08 DIAGNOSIS — E785 Hyperlipidemia, unspecified: Secondary | ICD-10-CM

## 2010-11-08 DIAGNOSIS — I10 Essential (primary) hypertension: Secondary | ICD-10-CM

## 2010-11-08 DIAGNOSIS — R7309 Other abnormal glucose: Secondary | ICD-10-CM

## 2010-11-08 NOTE — Patient Instructions (Signed)
Simvastatin may cause cramps - hold it if needed

## 2010-11-08 NOTE — Assessment & Plan Note (Signed)
Cont Rx 

## 2010-11-08 NOTE — Progress Notes (Signed)
  Subjective:    Patient ID: Allen Holloway, male    DOB: Apr 17, 1927, 77 y.o.   MRN: [id number]  HPI  The patient presents for a follow-up of  chronic hypertension, chronic dyslipidemia, type 2 pre- diabetes controlled with medicines C/o cramps     Review of Systems  Constitutional: Negative for appetite change, fatigue and unexpected weight change.  HENT: Negative for nosebleeds, congestion, sore throat, sneezing, trouble swallowing and neck pain.   Eyes: Negative for itching and visual disturbance.  Respiratory: Negative for cough.   Cardiovascular: Negative for chest pain, palpitations and leg swelling.  Gastrointestinal: Negative for nausea, diarrhea, blood in stool and abdominal distention.  Genitourinary: Negative for frequency and hematuria.  Musculoskeletal: Positive for myalgias and back pain. Negative for joint swelling and gait problem.  Skin: Negative for rash.  Neurological: Negative for dizziness, tremors, speech difficulty and weakness.  Psychiatric/Behavioral: Negative for sleep disturbance, dysphoric mood and agitation. The patient is not nervous/anxious.        Objective:   Physical Exam  Constitutional: He is oriented to person, place, and time. He appears well-developed.  HENT:  Mouth/Throat: Oropharynx is clear and moist.  Eyes: Conjunctivae are normal. Pupils are equal, round, and reactive to light.  Neck: Normal range of motion. No JVD present. No thyromegaly present.  Cardiovascular: Normal rate, regular rhythm, normal heart sounds and intact distal pulses.  Exam reveals no gallop and no friction rub.   No murmur heard. Pulmonary/Chest: Effort normal and breath sounds normal. No respiratory distress. He has no wheezes. He has no rales. He exhibits no tenderness.  Abdominal: Soft. Bowel sounds are normal. He exhibits no distension and no mass. There is no tenderness. There is no rebound and no guarding.  Musculoskeletal: Normal range of motion. He exhibits  no edema and no tenderness.  Lymphadenopathy:    He has no cervical adenopathy.  Neurological: He is alert and oriented to person, place, and time. He has normal reflexes. No cranial nerve deficit. He exhibits normal muscle tone. Coordination normal.  Skin: Skin is warm and dry. No rash noted.       Aging changes  Psychiatric: He has a normal mood and affect. His behavior is normal. Judgment and thought content normal.         Labs from [place] reviewed Assessment & Plan:

## 2010-11-08 NOTE — Assessment & Plan Note (Signed)
Watching  

## 2010-11-08 NOTE — Assessment & Plan Note (Signed)
Cont Rx OK to reduce Zocor if cramps

## 2010-11-08 NOTE — Assessment & Plan Note (Signed)
Watching cr 1.7 - the latest

## 2010-12-09 ENCOUNTER — Other Ambulatory Visit: Payer: Self-pay | Admitting: Cardiology

## 2010-12-09 DIAGNOSIS — I6529 Occlusion and stenosis of unspecified carotid artery: Secondary | ICD-10-CM

## 2010-12-12 ENCOUNTER — Encounter (INDEPENDENT_AMBULATORY_CARE_PROVIDER_SITE_OTHER): Payer: Medicare Other | Admitting: *Deleted

## 2010-12-12 DIAGNOSIS — I6529 Occlusion and stenosis of unspecified carotid artery: Secondary | ICD-10-CM

## 2010-12-28 ENCOUNTER — Other Ambulatory Visit: Payer: Self-pay | Admitting: *Deleted

## 2010-12-28 MED ORDER — HYDROCHLOROTHIAZIDE 25 MG PO TABS: 12.5000 mg | ORAL_TABLET | Freq: Every day | ORAL | Status: DC

## 2011-01-25 ENCOUNTER — Encounter: Payer: Self-pay | Admitting: Internal Medicine

## 2011-01-25 ENCOUNTER — Ambulatory Visit (INDEPENDENT_AMBULATORY_CARE_PROVIDER_SITE_OTHER): Payer: Medicare Other | Admitting: Internal Medicine

## 2011-01-25 VITALS — BP 128/64 | HR 72 | Temp 98.2°F | Resp 16

## 2011-01-25 DIAGNOSIS — L0390 Cellulitis, unspecified: Secondary | ICD-10-CM

## 2011-01-25 DIAGNOSIS — L0291 Cutaneous abscess, unspecified: Secondary | ICD-10-CM

## 2011-01-25 DIAGNOSIS — T310 Burns involving less than 10% of body surface: Secondary | ICD-10-CM

## 2011-01-25 DIAGNOSIS — Z23 Encounter for immunization: Secondary | ICD-10-CM

## 2011-01-25 MED ORDER — SILVER SULFADIAZINE 1 % EX CREA: TOPICAL_CREAM | CUTANEOUS | Status: DC

## 2011-01-25 MED ORDER — CEFUROXIME AXETIL 500 MG PO TABS: 500.0000 mg | ORAL_TABLET | Freq: Two times a day (BID) | ORAL | Status: AC

## 2011-01-25 MED ORDER — OXYCODONE-ACETAMINOPHEN 7.5-500 MG PO TABS: 1.0000 | ORAL_TABLET | Freq: Four times a day (QID) | ORAL | Status: DC | PRN

## 2011-01-25 NOTE — Patient Instructions (Signed)
Burn Care Instructions Your skin is a natural barrier to infection. It is the largest organ of your body. Because of your burn, this natural protection has been damaged. To help prevent infection, it is very important to follow these instructions in the care of your burn. BURNS ARE CLASSIFIED AS:  First degree - only erythema or redness of skin. No scarring expected.   Second degree - blistering of skin. No scarring expected.   Third degree - destruction of all layers of skin, scarring expected. Depending on size it may require grafting.  HOME CARE INSTRUCTIONS  Wash hands well before changing your bandage.   Change your bandage 2 times per day or as instructed by your caregiver.   Remove old bandage. (If bandage sticks to burn you may soak it off with cool, clean water).   Cleanse thoroughly but gently with mild soap and water.   Pat dry with a clean, dry cloth.   Apply a thin layer of anti-bacterial (germ) cream to the burn.   Apply clean bandages as shown to you in the Emergency Department or by your caregiver.   Keep dressing as clean and dry as possible.   Elevate affected area (such as hand or foot) for the first 24 hours, then as instructed by caregiver.   Only take over-the-counter or prescription medicines for pain, discomfort, or fever as directed by your caregiver.  SEEK IMMEDIATE MEDICAL CARE IF:  You develop excessive pain.   The burned area develops redness, tenderness, swelling, or red streaks near the burn.   The burned area develops pus or a foul odor.  You develop an oral temperature above 100.5Cellulitis Cellulitis is an infection of the skin and the tissue beneath it. The area is typically red and tender. It is caused by germs (bacteria) (usually staph or strep) that enter the body through cuts or sores. Cellulitis most commonly occurs in the arms or lower legs.  HOME CARE INSTRUCTIONS If you are given a prescription for medications which kill germs  (antibiotics), take as directed until finished.  If the infection is on the arm or leg, keep the limb elevated as able.  Use a warm cloth several times per day to relieve pain and encourage healing.  See your caregiver for recheck of the infected site in 3 days or sooner if problems arise.  Only take over-the-counter or prescription medicines for pain, discomfort, or fever as directed by your caregiver.  SEEK MEDICAL CARE IF: An oral temperature above 100.5 develops, not controlled by medication.  The area of redness (inflammation) is spreading, there are red streaks coming from the infected site, or if a part of the infection begins to turn dark in color.  The joint or bone underneath the infected skin becomes painful after the skin has healed.  The infection returns in the same or another area after it seems to have gone away.  A boil or bump swells up. This may be an abscess.  New, unexplained problems such as pain or fever develop.  SEEK IMMEDIATE MEDICAL CARE IF: You or your child feels drowsy or lethargic.  There is vomiting, diarrhea, or lasting discomfort or feeling ill (malaise) with muscle aches and pains.  MAKE SURE YOU:  Understand these instructions.  Will watch your condition.  Will get help right away if you are not doing well or get worse.  Document Released: 01/18/2005 Document Re-Released: 02/05/2009  [facility] Patient Information 2011 [place].Marland Kitchen  MAKE SURE YOU:   Understand these  instructions.   Will watch your condition.   Will get help right away if you are not doing well or get worse.  Document Released: 04/10/2005 Document Re-Released: 09/28/2009 [facility] Patient Information 2011 [place].
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
## 2011-03-02 ENCOUNTER — Other Ambulatory Visit (INDEPENDENT_AMBULATORY_CARE_PROVIDER_SITE_OTHER): Payer: Medicare Other

## 2011-03-02 DIAGNOSIS — I739 Peripheral vascular disease, unspecified: Secondary | ICD-10-CM

## 2011-03-02 DIAGNOSIS — E785 Hyperlipidemia, unspecified: Secondary | ICD-10-CM

## 2011-03-02 DIAGNOSIS — I10 Essential (primary) hypertension: Secondary | ICD-10-CM

## 2011-03-02 DIAGNOSIS — I6529 Occlusion and stenosis of unspecified carotid artery: Secondary | ICD-10-CM

## 2011-03-02 DIAGNOSIS — R739 Hyperglycemia, unspecified: Secondary | ICD-10-CM

## 2011-03-02 DIAGNOSIS — R252 Cramp and spasm: Secondary | ICD-10-CM

## 2011-03-02 DIAGNOSIS — R7309 Other abnormal glucose: Secondary | ICD-10-CM

## 2011-03-02 DIAGNOSIS — N259 Disorder resulting from impaired renal tubular function, unspecified: Secondary | ICD-10-CM

## 2011-03-02 LAB — COMPREHENSIVE METABOLIC PANEL
ALT: 15 U/L (ref 0–53)
BUN: 39 mg/dL — ABNORMAL HIGH (ref 6–23)
CO2: 29 mEq/L (ref 19–32)
Calcium: 9.8 mg/dL (ref 8.4–10.5)
Chloride: 103 mEq/L (ref 96–112)
Creatinine, Ser: 1.9 mg/dL — ABNORMAL HIGH (ref 0.4–1.5)
GFR: 35.98 mL/min — ABNORMAL LOW (ref >60.00)
Glucose, Bld: 124 mg/dL — ABNORMAL HIGH (ref 70–99)

## 2011-03-02 LAB — LIPID PANEL
Total CHOL/HDL Ratio: 2
Triglycerides: 65 mg/dL (ref 0.0–149.0)

## 2011-03-08 ENCOUNTER — Ambulatory Visit (INDEPENDENT_AMBULATORY_CARE_PROVIDER_SITE_OTHER): Payer: Medicare Other | Admitting: Internal Medicine

## 2011-03-08 ENCOUNTER — Encounter: Payer: Self-pay | Admitting: Internal Medicine

## 2011-03-08 DIAGNOSIS — E875 Hyperkalemia: Secondary | ICD-10-CM

## 2011-03-08 DIAGNOSIS — R7309 Other abnormal glucose: Secondary | ICD-10-CM

## 2011-03-08 DIAGNOSIS — I10 Essential (primary) hypertension: Secondary | ICD-10-CM

## 2011-03-08 DIAGNOSIS — E785 Hyperlipidemia, unspecified: Secondary | ICD-10-CM

## 2011-03-08 DIAGNOSIS — I2510 Atherosclerotic heart disease of native coronary artery without angina pectoris: Secondary | ICD-10-CM

## 2011-03-08 DIAGNOSIS — R739 Hyperglycemia, unspecified: Secondary | ICD-10-CM

## 2011-03-08 NOTE — Assessment & Plan Note (Signed)
Continue with current prescription therapy as reflected on the Med list.  

## 2011-03-08 NOTE — Assessment & Plan Note (Signed)
Labs reviewed.

## 2011-03-08 NOTE — Assessment & Plan Note (Signed)
Resolved

## 2011-03-08 NOTE — Progress Notes (Signed)
  Subjective:    Patient ID: Allen Holloway, male    DOB: 09/03/1926, 77 y.o.   MRN: [id number]  HPI The patient presents for a follow-up of  chronic hypertension, chronic dyslipidemia, type 2 pre diabetes controlled with medicines/diet, CRI     Review of Systems  Constitutional: Negative for appetite change, fatigue and unexpected weight change.  HENT: Negative for nosebleeds, congestion, sore throat, sneezing, trouble swallowing and neck pain.   Eyes: Negative for itching and visual disturbance.  Respiratory: Negative for cough.   Cardiovascular: Negative for chest pain, palpitations and leg swelling.  Gastrointestinal: Negative for nausea, diarrhea, blood in stool and abdominal distention.  Genitourinary: Negative for frequency and hematuria.  Musculoskeletal: Negative for back pain, joint swelling and gait problem.  Skin: Negative for rash.  Neurological: Negative for dizziness, tremors, speech difficulty and weakness.  Psychiatric/Behavioral: Negative for sleep disturbance, dysphoric mood and agitation. The patient is not nervous/anxious.        Objective:   Physical Exam  Constitutional: He is oriented to person, place, and time. He appears well-developed.  HENT:  Mouth/Throat: Oropharynx is clear and moist.  Eyes: Conjunctivae are normal. Pupils are equal, round, and reactive to light.  Neck: Normal range of motion. No JVD present. No thyromegaly present.  Cardiovascular: Normal rate, regular rhythm, normal heart sounds and intact distal pulses.  Exam reveals no gallop and no friction rub.   No murmur heard. Pulmonary/Chest: Effort normal and breath sounds normal. No respiratory distress. He has no wheezes. He has no rales. He exhibits no tenderness.  Abdominal: Soft. Bowel sounds are normal. He exhibits no distension and no mass. There is no tenderness. There is no rebound and no guarding.  Musculoskeletal: Normal range of motion. He exhibits no edema and no tenderness.    Lymphadenopathy:    He has no cervical adenopathy.  Neurological: He is alert and oriented to person, place, and time. He has normal reflexes. No cranial nerve deficit. He exhibits normal muscle tone. Coordination normal.  Skin: Skin is warm and dry. No rash noted.  Psychiatric: He has a normal mood and affect. His behavior is normal. Judgment and thought content normal.   Lab Results  Component Value Date   WBC 6.3 01/03/2010   HGB 14.0 01/03/2010   HCT 41.4 01/03/2010   PLT 158.0 01/03/2010   GLUCOSE 124* 03/02/2011   CHOL 150 03/02/2011   TRIG 65.0 03/02/2011   HDL 60.40 03/02/2011   LDLCALC 77 03/02/2011   ALT 15 03/02/2011   AST 22 03/02/2011   NA 140 03/02/2011   K 4.5 03/02/2011   CL 103 03/02/2011   CREATININE 1.9* 03/02/2011   BUN 39* 03/02/2011   CO2 29 03/02/2011   TSH 1.81 09/30/2009   PSA 0.49 09/30/2009   HGBA1C 6.5 03/02/2011          Assessment & Plan:

## 2011-05-05 ENCOUNTER — Encounter: Payer: Self-pay | Admitting: Cardiology

## 2011-05-05 ENCOUNTER — Ambulatory Visit (INDEPENDENT_AMBULATORY_CARE_PROVIDER_SITE_OTHER): Payer: Medicare Other | Admitting: Cardiology

## 2011-05-05 DIAGNOSIS — I6529 Occlusion and stenosis of unspecified carotid artery: Secondary | ICD-10-CM

## 2011-05-05 DIAGNOSIS — I10 Essential (primary) hypertension: Secondary | ICD-10-CM

## 2011-05-05 DIAGNOSIS — I25810 Atherosclerosis of coronary artery bypass graft(s) without angina pectoris: Secondary | ICD-10-CM

## 2011-05-05 DIAGNOSIS — I2510 Atherosclerotic heart disease of native coronary artery without angina pectoris: Secondary | ICD-10-CM

## 2011-05-05 DIAGNOSIS — E785 Hyperlipidemia, unspecified: Secondary | ICD-10-CM

## 2011-05-05 MED ORDER — ROSUVASTATIN CALCIUM 10 MG PO TABS: 10.0000 mg | ORAL_TABLET | Freq: Every day | ORAL | Status: DC

## 2011-05-05 MED ORDER — AMLODIPINE BESYLATE 5 MG PO TABS: 5.0000 mg | ORAL_TABLET | Freq: Every day | ORAL | Status: DC

## 2011-05-05 NOTE — Progress Notes (Signed)
PCP: Dr. Alain Marion  78 yo with history of CAD s/p CABG in 2005 with nonischemic myoview in 2008 presents for cardiology evaluation. Patient has been doing well recently. No chest pain.  He goes to the [facility] where he works out on the treadmill, elliptical, and stationary bike.  No exertional dyspnea.  He does report some aching in his legs that may be due to simvastatin.  Systolic BP at home has been running in the 130s primarily.  BP today is 151/73.  We are monitoring moderate carotid disease.  Echo in 8/11 showed preserved EF and mild valvular disease.    Labs (6/11): K 5.3, creatinine 1.6, LDL 70, HDL 58, HCT 37, TSH normal  Labs (11/12): K 4.5, creatinine 1.9, LDL 77, HDL 60  ECG: NSR, 1st degree AVB with PR interval 344 msec, LAFB  Allergies (verified):  1) Lotensin (Benazepril Hcl)   Past Medical History:  1. COPD  2. Coronary artery disease: CABG 2005 with LIMA-LAD, SVG-PDA, SVG-D1, SVG-OM. Myoview (9/08) showed EF 63%, no scar or ischemia.  Echo (8/11): EF 55%, mild AI, mild MR, moderate TR, mildly dilated RV, PA systolic pressure 34 mmHg.  3. Hyperlipidemia  4. Hypertension  5. Carotid bruit: Carotid dopplers (8/12) with [id number] RICA, [id number] LICA.   6. Cholelithiasis  7. CKD  8. Low back pain  9. Osteoarthritis  10. GERD  11. Hypothyroidism  12. ABIs normal 8/11.   Family History:  Family History High cholesterol  Family History Hypertension   Social History:  Retired  Married  Former Smoker   Review of [place] reviewed and negative except as per HPI.   Current Outpatient Prescriptions  Medication Sig Dispense Refill  . aspirin 81 MG tablet Take 81 mg by mouth daily.        . Cholecalciferol (VITAMIN D3) 1000 UNITS tablet Take 1,000 Units by mouth daily.        Marland Kitchen levothyroxine (SYNTHROID, LEVOTHROID) 100 MCG tablet Take 100 mcg by mouth daily.        . metoprolol succinate (TOPROL-XL) 25 MG 24 hr tablet Take 25 mg by mouth daily.        Marland Kitchen omeprazole  (PRILOSEC) 20 MG capsule Take 40 mg by mouth daily.        Marland Kitchen oxyCODONE-acetaminophen (PERCOCET) 7.5-500 MG per tablet Take 1 tablet by mouth every 6 (six) hours as needed for pain.  25 tablet  0  . silver sulfADIAZINE (SILVADENE) 1 % cream Apply to affected area daily  50 g  1  . triamcinolone (KENALOG) 0.1 % ointment Apply topically 2 (two) times daily.        Marland Kitchen amLODipine (NORVASC) 5 MG tablet Take 1 tablet (5 mg total) by mouth daily.  30 tablet  11  . Coenzyme Q10 200 MG TABS Take by mouth.    0  . rosuvastatin (CRESTOR) 10 MG tablet Take 1 tablet (10 mg total) by mouth at bedtime.  90 tablet  3  . DISCONTD: rosuvastatin (CRESTOR) 10 MG tablet Take 1 tablet (10 mg total) by mouth at bedtime.  30 tablet  11    BP 151/73  Pulse 58  Ht 5\' 8"  (1.727 m)  Wt 93.895 kg (207 lb)  BMI 31.47 kg/m2 General: NAD Neck: No JVD, no thyromegaly or thyroid nodule.  Lungs: Clear to auscultation bilaterally with normal respiratory effort. CV: Nondisplaced PMI.  Heart regular S1/S2, no S3/S4, no murmur.  1+ ankle edema.  No carotid bruit.  Normal  pedal pulses.  Abdomen: Soft, nontender, no hepatosplenomegaly, no distention.  Neurologic: Alert and oriented x 3.  Psych: Normal affect. Extremities: No clubbing or cyanosis.

## 2011-05-05 NOTE — Assessment & Plan Note (Signed)
Stable s/p CABG.  Not on ACEI with elevated creatinine.  Continue ASA 81 and statin.

## 2011-05-05 NOTE — Assessment & Plan Note (Signed)
BP mildly elevated.  With creatinine 1.9, HCTZ is probably not effective and may even be somewhat detrimental.  Will have him stop HCTZ and use amlodipine 5 mg daily.  Watch for increased lower extremity swelling. We will call in 2 wks for a BP check.

## 2011-05-05 NOTE — Patient Instructions (Signed)
Stop HCTZ(hydrochlorothiazide).  Start amlodipine 5mg  daily.  Stop Simvastatin.  Start Crestor 10mg  daily.  Take coenzymeQ10 200mg  daily.  Take and record your blood pressure. I will call you in about 2 weeks to get the readings. Allen Holloway [id number]  Your physician has requested that you have a carotid duplex. This test is an ultrasound of the carotid arteries in your neck. It looks at blood flow through these arteries that supply the brain with blood. Allow one hour for this exam. There are no restrictions or special instructions. February 2013  Your physician wants you to follow-up in: 1 year with Dr Aundra Dubin. (January 2014). You will receive a reminder letter in the mail two months in advance. If you don't receive a letter, please call our office to schedule the follow-up appointment.

## 2011-05-05 NOTE — Assessment & Plan Note (Signed)
Myalgias with Zocor.  I am also going to change his BP med to amlodipine.  Therefore, will stop Zocor and start him on Crestor 10 mg daily.  He can also take over the counter coenzyme Q 10 200 mg daily.

## 2011-05-05 NOTE — Assessment & Plan Note (Signed)
Moderate carotid stenosis.  Needs carotid dopplers in 2/13.

## 2011-05-23 ENCOUNTER — Telehealth: Payer: Self-pay | Admitting: *Deleted

## 2011-05-23 NOTE — Telephone Encounter (Signed)
HYPERTENSION - Loralie Champagne, MD 05/05/2011 10:52 AM Signed  BP mildly elevated. With creatinine 1.9, HCTZ is probably not effective and may even be somewhat detrimental. Will have him stop HCTZ and use amlodipine 5 mg daily. Watch for increased lower extremity swelling. We will call in 2 wks for a BP check.

## 2011-05-23 NOTE — Telephone Encounter (Signed)
05/23/11 Recent BP readings. 05/07/11 127/64 70 05/08/11 125/64 75 05/09/11 137/68 84 05/10/11 122/67 67 05/11/11 108/68 80 05/12/11 108/65 74 05/13/11 115/64 68 05/14/11 118/65 67 05/15/11 129/65 66 05/16/11 114/67 68 05/17/11 119/77 76 05/18/11 126/59 66 05/19/11 130/61 81 05/20/11 131/61 76 05/21/11 113/57 57 05/22/11 127/63 74 05/23/11 129/76 -pt has had the flu recently, otherwise he feels good. I will forward to Dr Aundra Dubin for review.

## 2011-05-29 NOTE — Telephone Encounter (Signed)
Good bp, no changes.

## 2011-05-29 NOTE — Telephone Encounter (Signed)
Pt.notified

## 2011-06-12 ENCOUNTER — Encounter: Payer: Medicare Other | Admitting: *Deleted

## 2011-06-13 ENCOUNTER — Encounter (INDEPENDENT_AMBULATORY_CARE_PROVIDER_SITE_OTHER): Payer: Medicare Other

## 2011-06-13 DIAGNOSIS — I25810 Atherosclerosis of coronary artery bypass graft(s) without angina pectoris: Secondary | ICD-10-CM

## 2011-06-13 DIAGNOSIS — I6529 Occlusion and stenosis of unspecified carotid artery: Secondary | ICD-10-CM

## 2011-06-13 DIAGNOSIS — I10 Essential (primary) hypertension: Secondary | ICD-10-CM

## 2011-06-29 ENCOUNTER — Other Ambulatory Visit: Payer: Self-pay | Admitting: *Deleted

## 2011-06-29 DIAGNOSIS — I6529 Occlusion and stenosis of unspecified carotid artery: Secondary | ICD-10-CM

## 2011-09-05 ENCOUNTER — Encounter: Payer: Self-pay | Admitting: Internal Medicine

## 2011-09-05 ENCOUNTER — Ambulatory Visit (INDEPENDENT_AMBULATORY_CARE_PROVIDER_SITE_OTHER): Payer: Medicare Other | Admitting: Internal Medicine

## 2011-09-05 VITALS — BP 130/72 | HR 59 | Temp 96.8°F | Wt 208.0 lb

## 2011-09-05 DIAGNOSIS — N259 Disorder resulting from impaired renal tubular function, unspecified: Secondary | ICD-10-CM

## 2011-09-05 DIAGNOSIS — I2510 Atherosclerotic heart disease of native coronary artery without angina pectoris: Secondary | ICD-10-CM

## 2011-09-05 DIAGNOSIS — I10 Essential (primary) hypertension: Secondary | ICD-10-CM

## 2011-09-05 DIAGNOSIS — E785 Hyperlipidemia, unspecified: Secondary | ICD-10-CM

## 2011-09-05 DIAGNOSIS — M1990 Unspecified osteoarthritis, unspecified site: Secondary | ICD-10-CM

## 2011-09-05 DIAGNOSIS — I739 Peripheral vascular disease, unspecified: Secondary | ICD-10-CM

## 2011-09-05 MED ORDER — ATORVASTATIN CALCIUM 20 MG PO TABS: 20.0000 mg | ORAL_TABLET | Freq: Every day | ORAL | Status: DC

## 2011-09-05 NOTE — Assessment & Plan Note (Signed)
Continue with current prescription therapy as reflected on the Med list.  

## 2011-09-05 NOTE — Progress Notes (Signed)
Patient ID: Allen Holloway, male   DOB: 03/14/1927, 78 y.o.   MRN: [id number]  Subjective:    Patient ID: Allen Holloway, male    DOB: May 03, 1926, 78 y.o.   MRN: [id number]  HPI The patient presents for a follow-up of  chronic hypertension, chronic dyslipidemia, type 2 pre diabetes controlled with medicines/diet, CRI. [place] doctor switched him from Crestor to Lipitor due to leg pain. He had labs there recentely  BP Readings from Last 3 Encounters:  09/05/11 130/72  05/05/11 151/73  03/08/11 140/62   Wt Readings from Last 3 Encounters:  09/05/11 208 lb (94.348 kg)  05/05/11 207 lb (93.895 kg)  03/08/11 204 lb (92.534 kg)         Review of Systems  Constitutional: Negative for appetite change, fatigue and unexpected weight change.  HENT: Negative for nosebleeds, congestion, sore throat, sneezing, trouble swallowing and neck pain.   Eyes: Negative for itching and visual disturbance.  Respiratory: Negative for cough.   Cardiovascular: Negative for chest pain, palpitations and leg swelling.  Gastrointestinal: Negative for nausea, diarrhea, blood in stool and abdominal distention.  Genitourinary: Negative for frequency and hematuria.  Musculoskeletal: Negative for back pain, joint swelling and gait problem.  Skin: Negative for rash.  Neurological: Negative for dizziness, tremors, speech difficulty and weakness.  Psychiatric/Behavioral: Negative for sleep disturbance, dysphoric mood and agitation. The patient is not nervous/anxious.        Objective:   Physical Exam  Constitutional: He is oriented to person, place, and time. He appears well-developed.  HENT:  Mouth/Throat: Oropharynx is clear and moist.  Eyes: Conjunctivae are normal. Pupils are equal, round, and reactive to light.  Neck: Normal range of motion. No JVD present. No thyromegaly present.  Cardiovascular: Normal rate, regular rhythm, normal heart sounds and intact distal pulses.  Exam reveals no gallop and no friction  rub.   No murmur heard. Pulmonary/Chest: Effort normal and breath sounds normal. No respiratory distress. He has no wheezes. He has no rales. He exhibits no tenderness.  Abdominal: Soft. Bowel sounds are normal. He exhibits no distension and no mass. There is no tenderness. There is no rebound and no guarding.  Musculoskeletal: Normal range of motion. He exhibits no edema and no tenderness.  Lymphadenopathy:    He has no cervical adenopathy.  Neurological: He is alert and oriented to person, place, and time. He has normal reflexes. No cranial nerve deficit. He exhibits normal muscle tone. Coordination normal.  Skin: Skin is warm and dry. No rash noted.  Psychiatric: He has a normal mood and affect. His behavior is normal. Judgment and thought content normal.   Lab Results  Component Value Date   WBC 6.3 01/03/2010   HGB 14.0 01/03/2010   HCT 41.4 01/03/2010   PLT 158.0 01/03/2010   GLUCOSE 124* 03/02/2011   CHOL 150 03/02/2011   TRIG 65.0 03/02/2011   HDL 60.40 03/02/2011   LDLCALC 77 03/02/2011   ALT 15 03/02/2011   AST 22 03/02/2011   NA 140 03/02/2011   K 4.5 03/02/2011   CL 103 03/02/2011   CREATININE 1.9* 03/02/2011   BUN 39* 03/02/2011   CO2 29 03/02/2011   TSH 1.81 09/30/2009   PSA 0.49 09/30/2009   HGBA1C 6.5 03/02/2011          Assessment & Plan:

## 2011-09-05 NOTE — Assessment & Plan Note (Signed)
Monitoring labs 

## 2011-09-05 NOTE — Assessment & Plan Note (Signed)
Continue with current prescription therapy as reflected on the Med list. He had labs at [facility] recentely

## 2011-12-22 ENCOUNTER — Other Ambulatory Visit: Payer: Self-pay | Admitting: Cardiology

## 2011-12-22 DIAGNOSIS — I6529 Occlusion and stenosis of unspecified carotid artery: Secondary | ICD-10-CM

## 2011-12-26 ENCOUNTER — Encounter (INDEPENDENT_AMBULATORY_CARE_PROVIDER_SITE_OTHER): Payer: Medicare Other

## 2011-12-26 DIAGNOSIS — I6529 Occlusion and stenosis of unspecified carotid artery: Secondary | ICD-10-CM

## 2011-12-26 DIAGNOSIS — R0989 Other specified symptoms and signs involving the circulatory and respiratory systems: Secondary | ICD-10-CM

## 2012-01-01 ENCOUNTER — Other Ambulatory Visit: Payer: Self-pay | Admitting: *Deleted

## 2012-01-01 ENCOUNTER — Other Ambulatory Visit (INDEPENDENT_AMBULATORY_CARE_PROVIDER_SITE_OTHER): Payer: Medicare Other

## 2012-01-01 DIAGNOSIS — N259 Disorder resulting from impaired renal tubular function, unspecified: Secondary | ICD-10-CM

## 2012-01-01 DIAGNOSIS — E039 Hypothyroidism, unspecified: Secondary | ICD-10-CM

## 2012-01-01 DIAGNOSIS — E785 Hyperlipidemia, unspecified: Secondary | ICD-10-CM

## 2012-01-01 DIAGNOSIS — E875 Hyperkalemia: Secondary | ICD-10-CM

## 2012-01-01 DIAGNOSIS — Z79899 Other long term (current) drug therapy: Secondary | ICD-10-CM

## 2012-01-01 LAB — LIPID PANEL
Cholesterol: 132 mg/dL (ref 0–200)
HDL: 54.1 mg/dL (ref >39.00)
VLDL: 11.8 mg/dL (ref 0.0–40.0)

## 2012-01-01 LAB — COMPREHENSIVE METABOLIC PANEL
AST: 24 U/L (ref 0–37)
Alkaline Phosphatase: 79 U/L (ref 39–117)
BUN: 30 mg/dL — ABNORMAL HIGH (ref 6–23)
Creatinine, Ser: 1.6 mg/dL — ABNORMAL HIGH (ref 0.4–1.5)

## 2012-01-09 ENCOUNTER — Encounter: Payer: Self-pay | Admitting: Internal Medicine

## 2012-01-09 ENCOUNTER — Ambulatory Visit (INDEPENDENT_AMBULATORY_CARE_PROVIDER_SITE_OTHER): Payer: Medicare Other | Admitting: Internal Medicine

## 2012-01-09 VITALS — BP 150/60 | HR 64 | Temp 97.1°F | Resp 16 | Wt 203.5 lb

## 2012-01-09 DIAGNOSIS — N259 Disorder resulting from impaired renal tubular function, unspecified: Secondary | ICD-10-CM

## 2012-01-09 DIAGNOSIS — I2510 Atherosclerotic heart disease of native coronary artery without angina pectoris: Secondary | ICD-10-CM

## 2012-01-09 DIAGNOSIS — I10 Essential (primary) hypertension: Secondary | ICD-10-CM

## 2012-01-09 DIAGNOSIS — I6529 Occlusion and stenosis of unspecified carotid artery: Secondary | ICD-10-CM

## 2012-01-09 DIAGNOSIS — E039 Hypothyroidism, unspecified: Secondary | ICD-10-CM

## 2012-01-09 DIAGNOSIS — Z23 Encounter for immunization: Secondary | ICD-10-CM

## 2012-01-09 DIAGNOSIS — R739 Hyperglycemia, unspecified: Secondary | ICD-10-CM

## 2012-01-09 DIAGNOSIS — R7309 Other abnormal glucose: Secondary | ICD-10-CM

## 2012-01-09 MED ORDER — ASPIRIN 325 MG PO TABS: 325.0000 mg | ORAL_TABLET | Freq: Every day | ORAL | Status: DC

## 2012-01-09 NOTE — Assessment & Plan Note (Signed)
Continue with current prescription therapy as reflected on the Med list.  

## 2012-01-09 NOTE — Assessment & Plan Note (Signed)
Will try to use ASA 325 mg 1/2 or 1 tab/a day if tolerted

## 2012-01-09 NOTE — Assessment & Plan Note (Signed)
Better  

## 2012-01-09 NOTE — Progress Notes (Signed)
  Subjective:    Patient ID: Allen Holloway, male    DOB: [birth year], 78 y.o.   MRN: [id number]  HPI The patient presents for a follow-up of  chronic hypertension, chronic dyslipidemia, type 2 pre diabetes controlled with medicines/diet, CRI. [place] doctor switched him from Crestor to Lipitor due to leg pain. He had labs there recently BP  BP Readings from Last 3 Encounters:  01/09/12 150/60  09/05/11 130/72  05/05/11 151/73   Wt Readings from Last 3 Encounters:  01/09/12 203 lb 8 oz (92.307 kg)  09/05/11 208 lb (94.348 kg)  05/05/11 207 lb (93.895 kg)         Review of Systems  Constitutional: Negative for appetite change, fatigue and unexpected weight change.  HENT: Negative for nosebleeds, congestion, sore throat, sneezing, trouble swallowing and neck pain.   Eyes: Negative for itching and visual disturbance.  Respiratory: Negative for cough.   Cardiovascular: Negative for chest pain, palpitations and leg swelling.  Gastrointestinal: Negative for nausea, diarrhea, blood in stool and abdominal distention.  Genitourinary: Negative for frequency and hematuria.  Musculoskeletal: Negative for back pain, joint swelling and gait problem.  Skin: Negative for rash.  Neurological: Negative for dizziness, tremors, speech difficulty and weakness.  Psychiatric/Behavioral: Negative for disturbed wake/sleep cycle, dysphoric mood and agitation. The patient is not nervous/anxious.        Objective:   Physical Exam  Constitutional: He is oriented to person, place, and time. He appears well-developed.  HENT:  Mouth/Throat: Oropharynx is clear and moist.  Eyes: Conjunctivae normal are normal. Pupils are equal, round, and reactive to light.  Neck: Normal range of motion. No JVD present. No thyromegaly present.  Cardiovascular: Normal rate, regular rhythm, normal heart sounds and intact distal pulses.  Exam reveals no gallop and no friction rub.   No murmur heard. Pulmonary/Chest: Effort  normal and breath sounds normal. No respiratory distress. He has no wheezes. He has no rales. He exhibits no tenderness.  Abdominal: Soft. Bowel sounds are normal. He exhibits no distension and no mass. There is no tenderness. There is no rebound and no guarding.  Musculoskeletal: Normal range of motion. He exhibits no edema and no tenderness.  Lymphadenopathy:    He has no cervical adenopathy.  Neurological: He is alert and oriented to person, place, and time. He has normal reflexes. No cranial nerve deficit. He exhibits normal muscle tone. Coordination normal.  Skin: Skin is warm and dry. No rash noted.  Psychiatric: He has a normal mood and affect. His behavior is normal. Judgment and thought content normal.   Lab Results  Component Value Date   WBC 6.3 01/03/2010   HGB 14.0 01/03/2010   HCT 41.4 01/03/2010   PLT 158.0 01/03/2010   GLUCOSE 104* 01/01/2012   CHOL 132 01/01/2012   TRIG 59.0 01/01/2012   HDL 54.10 01/01/2012   LDLCALC 66 01/01/2012   ALT 18 01/01/2012   AST 24 01/01/2012   NA 141 01/01/2012   K 4.8 01/01/2012   CL 107 01/01/2012   CREATININE 1.6* 01/01/2012   BUN 30* 01/01/2012   CO2 26 01/01/2012   TSH 1.85 01/01/2012   PSA 0.49 09/30/2009   HGBA1C 6.2 01/01/2012          Assessment & Plan:

## 2012-01-09 NOTE — Assessment & Plan Note (Signed)
except

## 2012-01-09 NOTE — Assessment & Plan Note (Signed)
Monitoring lab. Better

## 2012-04-02 ENCOUNTER — Encounter: Payer: Self-pay | Admitting: Thoracic Surgery (Cardiothoracic Vascular Surgery)

## 2012-04-02 ENCOUNTER — Ambulatory Visit
Admission: RE | Admit: 2012-04-02 | Discharge: 2012-04-02 | Disposition: A | Discharge status: Home / Self Care | Payer: Medicare Other | Source: Ambulatory Visit | Attending: Thoracic Surgery (Cardiothoracic Vascular Surgery) | Admitting: Thoracic Surgery (Cardiothoracic Vascular Surgery)

## 2012-04-02 ENCOUNTER — Other Ambulatory Visit: Payer: Self-pay | Admitting: *Deleted

## 2012-04-02 ENCOUNTER — Ambulatory Visit (INDEPENDENT_AMBULATORY_CARE_PROVIDER_SITE_OTHER): Payer: Medicare Other | Admitting: Thoracic Surgery (Cardiothoracic Vascular Surgery)

## 2012-04-02 VITALS — BP 133/75 | HR 66 | Resp 16 | Ht 68.0 in | Wt 204.0 lb

## 2012-04-02 DIAGNOSIS — T84218A Breakdown (mechanical) of internal fixation device of other bones, initial encounter: Secondary | ICD-10-CM

## 2012-04-02 DIAGNOSIS — Z951 Presence of aortocoronary bypass graft: Secondary | ICD-10-CM

## 2012-04-02 DIAGNOSIS — T84498A Other mechanical complication of other internal orthopedic devices, implants and grafts, initial encounter: Secondary | ICD-10-CM

## 2012-04-02 DIAGNOSIS — I2510 Atherosclerotic heart disease of native coronary artery without angina pectoris: Secondary | ICD-10-CM

## 2012-04-02 NOTE — Progress Notes (Signed)
  HPI:  Allen Holloway returns for a chief complaint of a wire coming out of his chest.  Allen Holloway is a 78 year old gentleman who had coronary bypass grafting x4 back in October of 2004. He says that about a year after the surgery he noticed a lump on the right side of the incision about midway up the chest. This was intermittently get sore and irritated and other times wouldn't bother him at all. Recently this area it got red and then a couple days ago he noted there was a wire poking out the skin. He pulled this out and since then has not been having any pain. He denies feeling any motion or clicking or popping in the sternum.  Past Medical History  Diagnosis Date  . COPD (chronic obstructive pulmonary disease)   . Hyperlipidemia   . Hypertension   . Arthritis     osteo  . GERD (gastroesophageal reflux disease)   . Thyroid disease     hypothyroidism  . CAD (coronary artery disease)   . Carotid bruit   . Cholelithiasis   . CKD (chronic kidney disease)   . Lumbago   . Diverticulosis       Current Outpatient Prescriptions  Medication Sig Dispense Refill  . amLODipine (NORVASC) 5 MG tablet Take 1 tablet (5 mg total) by mouth daily.  30 tablet  11  . aspirin 325 MG tablet Take 325 mg by mouth daily. TAKE 1/2 TAB      . atorvastatin (LIPITOR) 20 MG tablet Take 1 tablet (20 mg total) by mouth daily.  90 tablet  3  . Cholecalciferol (VITAMIN D3) 1000 UNITS tablet Take 1,000 Units by mouth daily.        . Coenzyme Q10 200 MG TABS Take by mouth.    0  . levothyroxine (SYNTHROID, LEVOTHROID) 100 MCG tablet Take 100 mcg by mouth daily.        . metoprolol succinate (TOPROL-XL) 25 MG 24 hr tablet Take 25 mg by mouth daily.        Marland Kitchen omeprazole (PRILOSEC) 20 MG capsule Take 40 mg by mouth daily.        Marland Kitchen triamcinolone (KENALOG) 0.1 % ointment Apply topically 2 (two) times daily.          Physical Exam BP 133/75  Pulse 66  Resp 16  Ht 5\' 8"  (1.727 m)  Wt 204 lb (92.534 kg)  BMI 31.02  kg/m2  SpO88 66% 78 year old male in no acute distress Sternal incision is healed There is a circular area on the right side proximally 5-6 mm from the incision itself where the wire came out Adjacent to this area is a palpable smooth, firm "mass"-unclear if this is part of the wire or a bone fragment  Diagnostic Tests: Chest x-ray shows some evidence of wire pulled through and the missing wire knot on the lower figure-of-eight sternal wire.  Impression: Fractured wire with migration through the skin. I discussed with Mr. and Mrs. Goers the options of observation versus surgical exploration to remove the remaining wires. Discussed the relative advantages and disadvantages of each approach. I don't think he is in danger with the wires in place. He knows to call if he develops any additional irritation or inflammation the skin or develops any new sternal pain. He wishes to up for observation this time.  Plan: Patient knows to call if he has any further questions

## 2012-05-06 ENCOUNTER — Other Ambulatory Visit: Payer: Self-pay | Admitting: Cardiology

## 2012-05-10 ENCOUNTER — Other Ambulatory Visit (INDEPENDENT_AMBULATORY_CARE_PROVIDER_SITE_OTHER): Payer: Medicare Other

## 2012-05-10 ENCOUNTER — Encounter: Payer: Self-pay | Admitting: Internal Medicine

## 2012-05-10 ENCOUNTER — Ambulatory Visit (INDEPENDENT_AMBULATORY_CARE_PROVIDER_SITE_OTHER): Payer: Medicare Other | Admitting: Internal Medicine

## 2012-05-10 VITALS — BP 148/70 | HR 72 | Temp 98.1°F | Resp 16 | Wt 212.0 lb

## 2012-05-10 DIAGNOSIS — Z23 Encounter for immunization: Secondary | ICD-10-CM

## 2012-05-10 DIAGNOSIS — N259 Disorder resulting from impaired renal tubular function, unspecified: Secondary | ICD-10-CM

## 2012-05-10 DIAGNOSIS — E039 Hypothyroidism, unspecified: Secondary | ICD-10-CM

## 2012-05-10 DIAGNOSIS — R7309 Other abnormal glucose: Secondary | ICD-10-CM

## 2012-05-10 DIAGNOSIS — L259 Unspecified contact dermatitis, unspecified cause: Secondary | ICD-10-CM

## 2012-05-10 DIAGNOSIS — R739 Hyperglycemia, unspecified: Secondary | ICD-10-CM

## 2012-05-10 DIAGNOSIS — I2510 Atherosclerotic heart disease of native coronary artery without angina pectoris: Secondary | ICD-10-CM

## 2012-05-10 DIAGNOSIS — I10 Essential (primary) hypertension: Secondary | ICD-10-CM

## 2012-05-10 DIAGNOSIS — L309 Dermatitis, unspecified: Secondary | ICD-10-CM | POA: Insufficient documentation

## 2012-05-10 DIAGNOSIS — I6529 Occlusion and stenosis of unspecified carotid artery: Secondary | ICD-10-CM

## 2012-05-10 DIAGNOSIS — E785 Hyperlipidemia, unspecified: Secondary | ICD-10-CM

## 2012-05-10 LAB — BASIC METABOLIC PANEL
BUN: 32 mg/dL — ABNORMAL HIGH (ref 6–23)
GFR: 33.63 mL/min — ABNORMAL LOW (ref >60.00)
Potassium: 5.2 mEq/L — ABNORMAL HIGH (ref 3.5–5.1)
Sodium: 139 mEq/L (ref 135–145)

## 2012-05-10 MED ORDER — METOPROLOL SUCCINATE ER 25 MG PO TB24: 25.0000 mg | ORAL_TABLET | Freq: Every day | ORAL | Status: DC

## 2012-05-10 MED ORDER — AMLODIPINE BESYLATE 5 MG PO TABS: 5.0000 mg | ORAL_TABLET | Freq: Every day | ORAL | Status: DC

## 2012-05-10 MED ORDER — TRIAMCINOLONE ACETONIDE 0.1 % EX OINT: TOPICAL_OINTMENT | Freq: Two times a day (BID) | CUTANEOUS | Status: DC

## 2012-05-10 NOTE — Assessment & Plan Note (Signed)
Watching  

## 2012-05-10 NOTE — Assessment & Plan Note (Signed)
Continue with current prescription therapy as reflected on the Med list.  

## 2012-05-10 NOTE — Progress Notes (Signed)
   Subjective:    HPI The patient presents for a follow-up of  chronic hypertension, chronic dyslipidemia, type 2 pre diabetes controlled with medicines/diet, CRI. [place] doctor switched him from Crestor to Lipitor due to leg pain. He had labs there recently BP. C/o heels cracking  BP Readings from Last 3 Encounters:  05/10/12 148/70  04/02/12 133/75  01/09/12 150/60   Wt Readings from Last 3 Encounters:  05/10/12 212 lb (96.163 kg)  04/02/12 204 lb (92.534 kg)  01/09/12 203 lb 8 oz (92.307 kg)    Review of Systems  Constitutional: Negative for appetite change, fatigue and unexpected weight change.  HENT: Negative for nosebleeds, congestion, sore throat, sneezing, trouble swallowing and neck pain.   Eyes: Negative for itching and visual disturbance.  Respiratory: Negative for cough.   Cardiovascular: Negative for chest pain, palpitations and leg swelling.  Gastrointestinal: Negative for nausea, diarrhea, blood in stool and abdominal distention.  Genitourinary: Negative for frequency and hematuria.  Musculoskeletal: Negative for back pain, joint swelling and gait problem.  Skin: Negative for rash.  Neurological: Negative for dizziness, tremors, speech difficulty and weakness.  Psychiatric/Behavioral: Negative for sleep disturbance, dysphoric mood and agitation. The patient is not nervous/anxious.        Objective:   Physical Exam  Constitutional: He is oriented to person, place, and time. He appears well-developed.  HENT:  Mouth/Throat: Oropharynx is clear and moist.  Eyes: Conjunctivae normal are normal. Pupils are equal, round, and reactive to light.  Neck: Normal range of motion. No JVD present. No thyromegaly present.  Cardiovascular: Normal rate, regular rhythm, normal heart sounds and intact distal pulses.  Exam reveals no gallop and no friction rub.   No murmur heard. Pulmonary/Chest: Effort normal and breath sounds normal. No respiratory distress. He has no wheezes. He  has no rales. He exhibits no tenderness.  Abdominal: Soft. Bowel sounds are normal. He exhibits no distension and no mass. There is no tenderness. There is no rebound and no guarding.  Musculoskeletal: Normal range of motion. He exhibits no edema and no tenderness.  Lymphadenopathy:    He has no cervical adenopathy.  Neurological: He is alert and oriented to person, place, and time. He has normal reflexes. No cranial nerve deficit. He exhibits normal muscle tone. Coordination normal.  Skin: Skin is warm and dry. No rash noted.  Psychiatric: He has a normal mood and affect. His behavior is normal. Judgment and thought content normal.  cracked heels, fingers  Lab Results  Component Value Date   WBC 6.3 01/03/2010   HGB 14.0 01/03/2010   HCT 41.4 01/03/2010   PLT 158.0 01/03/2010   GLUCOSE 104* 01/01/2012   CHOL 132 01/01/2012   TRIG 59.0 01/01/2012   HDL 54.10 01/01/2012   LDLCALC 66 01/01/2012   ALT 18 01/01/2012   AST 24 01/01/2012   NA 141 01/01/2012   K 4.8 01/01/2012   CL 107 01/01/2012   CREATININE 1.6* 01/01/2012   BUN 30* 01/01/2012   CO2 26 01/01/2012   TSH 1.85 01/01/2012   PSA 0.49 09/30/2009   HGBA1C 6.2 01/01/2012       Assessment & Plan:

## 2012-05-10 NOTE — Assessment & Plan Note (Signed)
Triamc oint prn °

## 2012-07-26 ENCOUNTER — Encounter (INDEPENDENT_AMBULATORY_CARE_PROVIDER_SITE_OTHER): Payer: Medicare Other

## 2012-07-26 DIAGNOSIS — I6529 Occlusion and stenosis of unspecified carotid artery: Secondary | ICD-10-CM

## 2012-08-05 ENCOUNTER — Telehealth: Payer: Self-pay | Admitting: Internal Medicine

## 2012-08-05 NOTE — Telephone Encounter (Signed)
Patient requesting call back about his medications

## 2012-08-06 NOTE — Telephone Encounter (Signed)
I called pt- he states he states he is clear now his medication question. Closing phone note.

## 2012-09-11 ENCOUNTER — Ambulatory Visit (INDEPENDENT_AMBULATORY_CARE_PROVIDER_SITE_OTHER): Payer: Medicare Other | Admitting: Internal Medicine

## 2012-09-11 ENCOUNTER — Other Ambulatory Visit (INDEPENDENT_AMBULATORY_CARE_PROVIDER_SITE_OTHER): Payer: Medicare Other

## 2012-09-11 ENCOUNTER — Ambulatory Visit (INDEPENDENT_AMBULATORY_CARE_PROVIDER_SITE_OTHER)
Admission: RE | Admit: 2012-09-11 | Discharge: 2012-09-11 | Disposition: A | Discharge status: Home / Self Care | Payer: Medicare Other | Source: Ambulatory Visit | Attending: Internal Medicine | Admitting: Internal Medicine

## 2012-09-11 ENCOUNTER — Encounter: Payer: Self-pay | Admitting: Internal Medicine

## 2012-09-11 VITALS — BP 130/70 | HR 80 | Temp 98.2°F | Resp 16 | Wt 209.0 lb

## 2012-09-11 DIAGNOSIS — R0609 Other forms of dyspnea: Secondary | ICD-10-CM

## 2012-09-11 DIAGNOSIS — R0989 Other specified symptoms and signs involving the circulatory and respiratory systems: Secondary | ICD-10-CM

## 2012-09-11 DIAGNOSIS — R609 Edema, unspecified: Secondary | ICD-10-CM

## 2012-09-11 DIAGNOSIS — R05 Cough: Secondary | ICD-10-CM

## 2012-09-11 DIAGNOSIS — R059 Cough, unspecified: Secondary | ICD-10-CM

## 2012-09-11 DIAGNOSIS — R0600 Dyspnea, unspecified: Secondary | ICD-10-CM

## 2012-09-11 DIAGNOSIS — E785 Hyperlipidemia, unspecified: Secondary | ICD-10-CM

## 2012-09-11 LAB — BASIC METABOLIC PANEL
BUN: 27 mg/dL — ABNORMAL HIGH (ref 6–23)
Calcium: 9.6 mg/dL (ref 8.4–10.5)
GFR: 41.56 mL/min — ABNORMAL LOW (ref >60.00)
Glucose, Bld: 89 mg/dL (ref 70–99)
Sodium: 140 mEq/L (ref 135–145)

## 2012-09-11 LAB — URINALYSIS
Bilirubin Urine: NEGATIVE
Hgb urine dipstick: NEGATIVE
Ketones, ur: NEGATIVE
Total Protein, Urine: NEGATIVE
Urine Glucose: NEGATIVE

## 2012-09-11 MED ORDER — FUROSEMIDE 20 MG PO TABS: 20.0000 mg | ORAL_TABLET | Freq: Every day | ORAL | Status: DC | PRN

## 2012-09-11 MED ORDER — MELOXICAM 7.5 MG PO TABS: 7.5000 mg | ORAL_TABLET | Freq: Every day | ORAL | Status: DC | PRN

## 2012-09-11 MED ORDER — AMLODIPINE BESYLATE 5 MG PO TABS: 2.5000 mg | ORAL_TABLET | Freq: Every day | ORAL | Status: DC

## 2012-09-11 NOTE — Assessment & Plan Note (Signed)
CXR

## 2012-09-12 ENCOUNTER — Other Ambulatory Visit: Payer: Self-pay | Admitting: Internal Medicine

## 2012-09-12 DIAGNOSIS — R0600 Dyspnea, unspecified: Secondary | ICD-10-CM

## 2012-09-12 DIAGNOSIS — R609 Edema, unspecified: Secondary | ICD-10-CM

## 2012-09-12 NOTE — Assessment & Plan Note (Signed)
Start furosemide

## 2012-09-12 NOTE — Progress Notes (Signed)
   Subjective:    HPI C/o leg swelling and R leg small wound oozing liquid.  The patient presents for a follow-up of  chronic hypertension, chronic dyslipidemia, type 2 pre diabetes controlled with medicines/diet, CRI. [place] doctor switched him from Crestor to Lipitor due to leg pain. He had labs there recently BP.   BP Readings from Last 3 Encounters:  09/11/12 130/70  05/10/12 148/70  04/02/12 133/75   Wt Readings from Last 3 Encounters:  09/11/12 209 lb (94.802 kg)  05/10/12 212 lb (96.163 kg)  04/02/12 204 lb (92.534 kg)    Review of Systems  Constitutional: Negative for appetite change, fatigue and unexpected weight change.  HENT: Negative for nosebleeds, congestion, sore throat, sneezing, trouble swallowing and neck pain.   Eyes: Negative for itching and visual disturbance.  Respiratory: Negative for cough.   Cardiovascular: Negative for chest pain, palpitations and leg swelling.  Gastrointestinal: Negative for nausea, diarrhea, blood in stool and abdominal distention.  Genitourinary: Negative for frequency and hematuria.  Musculoskeletal: Negative for back pain, joint swelling and gait problem.  Skin: Negative for rash.  Neurological: Negative for dizziness, tremors, speech difficulty and weakness.  Psychiatric/Behavioral: Negative for sleep disturbance, dysphoric mood and agitation. The patient is not nervous/anxious.        Objective:   Physical Exam  Constitutional: He is oriented to person, place, and time. He appears well-developed.  HENT:  Mouth/Throat: Oropharynx is clear and moist.  Eyes: Conjunctivae are normal. Pupils are equal, round, and reactive to light.  Neck: Normal range of motion. No JVD present. No thyromegaly present.  Cardiovascular: Normal rate, regular rhythm, normal heart sounds and intact distal pulses.  Exam reveals no gallop and no friction rub.   No murmur heard. Pulmonary/Chest: Effort normal and breath sounds normal. No respiratory  distress. He has no wheezes. He has no rales. He exhibits no tenderness.  Abdominal: Soft. Bowel sounds are normal. He exhibits no distension and no mass. There is no tenderness. There is no rebound and no guarding.  Musculoskeletal: Normal range of motion. He exhibits no edema and no tenderness.  Lymphadenopathy:    He has no cervical adenopathy.  Neurological: He is alert and oriented to person, place, and time. He has normal reflexes. No cranial nerve deficit. He exhibits normal muscle tone. Coordination normal.  Skin: Skin is warm and dry. No rash noted.  Psychiatric: He has a normal mood and affect. His behavior is normal. Judgment and thought content normal.  cracked heels, fingers R LE 2+ and L 1+ edema Puncture wound on R ankle is oozing liquid  Lab Results  Component Value Date   WBC 6.3 01/03/2010   HGB 14.0 01/03/2010   HCT 41.4 01/03/2010   PLT 158.0 01/03/2010   GLUCOSE 89 09/11/2012   CHOL 132 01/01/2012   TRIG 59.0 01/01/2012   HDL 54.10 01/01/2012   LDLCALC 66 01/01/2012   ALT 18 01/01/2012   AST 24 01/01/2012   NA 140 09/11/2012   K 4.7 09/11/2012   CL 106 09/11/2012   CREATININE 1.7* 09/11/2012   BUN 27* 09/11/2012   CO2 26 09/11/2012   TSH 0.16* 09/11/2012   PSA 0.49 09/30/2009   HGBA1C 6.2 01/01/2012       Assessment & Plan:

## 2012-09-12 NOTE — Assessment & Plan Note (Signed)
Continue with current prescription therapy as reflected on the Med list.  

## 2012-09-13 ENCOUNTER — Encounter: Payer: Self-pay | Admitting: Internal Medicine

## 2012-09-13 ENCOUNTER — Ambulatory Visit (INDEPENDENT_AMBULATORY_CARE_PROVIDER_SITE_OTHER): Payer: Medicare Other | Admitting: Internal Medicine

## 2012-09-13 VITALS — BP 152/88 | HR 76 | Temp 98.0°F | Resp 16 | Ht 68.0 in | Wt 207.0 lb

## 2012-09-13 DIAGNOSIS — N320 Bladder-neck obstruction: Secondary | ICD-10-CM

## 2012-09-13 DIAGNOSIS — N259 Disorder resulting from impaired renal tubular function, unspecified: Secondary | ICD-10-CM

## 2012-09-13 DIAGNOSIS — E039 Hypothyroidism, unspecified: Secondary | ICD-10-CM

## 2012-09-13 DIAGNOSIS — R609 Edema, unspecified: Secondary | ICD-10-CM

## 2012-09-13 DIAGNOSIS — Z0000 Encounter for general adult medical examination without abnormal findings: Secondary | ICD-10-CM

## 2012-09-13 DIAGNOSIS — I10 Essential (primary) hypertension: Secondary | ICD-10-CM

## 2012-09-13 DIAGNOSIS — I6529 Occlusion and stenosis of unspecified carotid artery: Secondary | ICD-10-CM

## 2012-09-13 NOTE — Assessment & Plan Note (Signed)
Continue with current prescription therapy as reflected on the Med list.  

## 2012-09-13 NOTE — Assessment & Plan Note (Signed)
Recheck labs 

## 2012-09-13 NOTE — Progress Notes (Signed)
   Subjective:    HPI The patient is here for a wellness exam.  F/u leg swelling - better and R leg small wound is not oozing liquid now. He took Furosemide 40 mg x 1 d only yet  The patient presents for a follow-up of  chronic hypertension, chronic dyslipidemia, type 2 pre diabetes controlled with medicines/diet, CRI. [place] doctor switched him from Crestor to Lipitor due to leg pain. He had labs there recently BP.   BP Readings from Last 3 Encounters:  09/13/12 152/88  09/11/12 130/70  05/10/12 148/70   Wt Readings from Last 3 Encounters:  09/13/12 207 lb (93.895 kg)  09/11/12 209 lb (94.802 kg)  05/10/12 212 lb (96.163 kg)    Review of Systems  Constitutional: Negative for appetite change, fatigue and unexpected weight change.  HENT: Negative for nosebleeds, congestion, sore throat, sneezing, trouble swallowing and neck pain.   Eyes: Negative for itching and visual disturbance.  Respiratory: Negative for cough.   Cardiovascular: Positive for leg swelling. Negative for chest pain and palpitations.  Gastrointestinal: Negative for nausea, diarrhea, blood in stool and abdominal distention.  Genitourinary: Negative for frequency and hematuria.  Musculoskeletal: Positive for arthralgias and gait problem. Negative for back pain and joint swelling.  Skin: Negative for rash.  Neurological: Negative for dizziness, tremors, speech difficulty and weakness.  Psychiatric/Behavioral: Negative for sleep disturbance, dysphoric mood and agitation. The patient is not nervous/anxious.        Objective:   Physical Exam  Constitutional: He is oriented to person, place, and time. He appears well-developed.  HENT:  Mouth/Throat: Oropharynx is clear and moist.  Eyes: Conjunctivae are normal. Pupils are equal, round, and reactive to light.  Neck: Normal range of motion. No JVD present. No thyromegaly present.  Cardiovascular: Normal rate, regular rhythm, normal heart sounds and intact distal pulses.   Exam reveals no gallop and no friction rub.   No murmur heard. Pulmonary/Chest: Effort normal and breath sounds normal. No respiratory distress. He has no wheezes. He has no rales. He exhibits no tenderness.  Abdominal: Soft. Bowel sounds are normal. He exhibits no distension and no mass. There is no tenderness. There is no rebound and no guarding.  Genitourinary:  declined  Musculoskeletal: Normal range of motion. He exhibits edema. He exhibits no tenderness.  Lymphadenopathy:    He has no cervical adenopathy.  Neurological: He is alert and oriented to person, place, and time. He has normal reflexes. No cranial nerve deficit. He exhibits normal muscle tone. Coordination normal.  Skin: Skin is warm and dry. No rash noted.  Psychiatric: He has a normal mood and affect. His behavior is normal. Judgment and thought content normal.  cracked heels, fingers R LE 1+ and L trace edema Puncture wound on R ankle is oozing liquid  Lab Results  Component Value Date   WBC 6.3 01/03/2010   HGB 14.0 01/03/2010   HCT 41.4 01/03/2010   PLT 158.0 01/03/2010   GLUCOSE 89 09/11/2012   CHOL 132 01/01/2012   TRIG 59.0 01/01/2012   HDL 54.10 01/01/2012   LDLCALC 66 01/01/2012   ALT 18 01/01/2012   AST 24 01/01/2012   NA 140 09/11/2012   K 4.7 09/11/2012   CL 106 09/11/2012   CREATININE 1.7* 09/11/2012   BUN 27* 09/11/2012   CO2 26 09/11/2012   TSH 0.16* 09/11/2012   PSA 0.49 09/30/2009   HGBA1C 6.2 01/01/2012       Assessment & Plan:

## 2012-09-13 NOTE — Assessment & Plan Note (Signed)
Labs reviewe Ven doppler of LEs and ECHO are ordered Cont Furosemide

## 2012-09-25 ENCOUNTER — Ambulatory Visit (HOSPITAL_COMMUNITY): Payer: Medicare Other | Attending: Internal Medicine | Admitting: Radiology

## 2012-09-25 DIAGNOSIS — R0609 Other forms of dyspnea: Secondary | ICD-10-CM | POA: Insufficient documentation

## 2012-09-25 DIAGNOSIS — R609 Edema, unspecified: Secondary | ICD-10-CM

## 2012-09-25 DIAGNOSIS — R0600 Dyspnea, unspecified: Secondary | ICD-10-CM

## 2012-09-25 DIAGNOSIS — R0989 Other specified symptoms and signs involving the circulatory and respiratory systems: Secondary | ICD-10-CM | POA: Insufficient documentation

## 2012-09-25 DIAGNOSIS — R0602 Shortness of breath: Secondary | ICD-10-CM

## 2012-09-25 NOTE — Progress Notes (Signed)
Echocardiogram performed.  

## 2012-10-01 ENCOUNTER — Encounter (INDEPENDENT_AMBULATORY_CARE_PROVIDER_SITE_OTHER): Payer: Medicare Other

## 2012-10-01 DIAGNOSIS — R0600 Dyspnea, unspecified: Secondary | ICD-10-CM

## 2012-10-01 DIAGNOSIS — R609 Edema, unspecified: Secondary | ICD-10-CM

## 2012-10-09 ENCOUNTER — Encounter: Payer: Self-pay | Admitting: Internal Medicine

## 2012-10-09 ENCOUNTER — Other Ambulatory Visit (INDEPENDENT_AMBULATORY_CARE_PROVIDER_SITE_OTHER): Payer: Medicare Other

## 2012-10-09 ENCOUNTER — Ambulatory Visit (INDEPENDENT_AMBULATORY_CARE_PROVIDER_SITE_OTHER): Payer: Medicare Other | Admitting: Internal Medicine

## 2012-10-09 VITALS — BP 130/70 | HR 76 | Temp 97.5°F | Resp 16 | Wt 205.0 lb

## 2012-10-09 DIAGNOSIS — I10 Essential (primary) hypertension: Secondary | ICD-10-CM

## 2012-10-09 DIAGNOSIS — E039 Hypothyroidism, unspecified: Secondary | ICD-10-CM

## 2012-10-09 DIAGNOSIS — J449 Chronic obstructive pulmonary disease, unspecified: Secondary | ICD-10-CM

## 2012-10-09 DIAGNOSIS — R739 Hyperglycemia, unspecified: Secondary | ICD-10-CM

## 2012-10-09 DIAGNOSIS — R7309 Other abnormal glucose: Secondary | ICD-10-CM

## 2012-10-09 DIAGNOSIS — R609 Edema, unspecified: Secondary | ICD-10-CM

## 2012-10-09 DIAGNOSIS — Z0000 Encounter for general adult medical examination without abnormal findings: Secondary | ICD-10-CM

## 2012-10-09 DIAGNOSIS — I6529 Occlusion and stenosis of unspecified carotid artery: Secondary | ICD-10-CM

## 2012-10-09 DIAGNOSIS — N320 Bladder-neck obstruction: Secondary | ICD-10-CM

## 2012-10-09 DIAGNOSIS — N259 Disorder resulting from impaired renal tubular function, unspecified: Secondary | ICD-10-CM

## 2012-10-09 LAB — BASIC METABOLIC PANEL
CO2: 29 mEq/L (ref 19–32)
Calcium: 9.1 mg/dL (ref 8.4–10.5)
Chloride: 96 mEq/L (ref 96–112)
Glucose, Bld: 120 mg/dL — ABNORMAL HIGH (ref 70–99)
Sodium: 135 mEq/L (ref 135–145)

## 2012-10-09 LAB — CBC WITH DIFFERENTIAL/PLATELET
Basophils Absolute: 0 10*3/uL (ref 0.0–0.1)
Basophils Relative: 0.3 % (ref 0.0–3.0)
Eosinophils Absolute: 0.2 10*3/uL (ref 0.0–0.7)
HCT: 42.3 % (ref 39.0–52.0)
Hemoglobin: 14.3 g/dL (ref 13.0–17.0)
Lymphs Abs: 3 10*3/uL (ref 0.7–4.0)
MCHC: 33.9 g/dL (ref 30.0–36.0)
MCV: 88.8 fl (ref 78.0–100.0)
Monocytes Absolute: 0.7 10*3/uL (ref 0.1–1.0)
Neutro Abs: 4.7 10*3/uL (ref 1.4–7.7)
RBC: 4.76 Mil/uL (ref 4.22–5.81)
RDW: 14.1 % (ref 11.5–14.6)

## 2012-10-09 NOTE — Patient Instructions (Signed)
Stop Amlodipine 

## 2012-10-09 NOTE — Assessment & Plan Note (Signed)
see meds

## 2012-10-09 NOTE — Progress Notes (Signed)
Patient ID: Allen Holloway, male   DOB: 03/23/1927, 79 y.o.   MRN: [id number]   Subjective:    HPI The patient is here for a wellness exam.  F/u leg swelling - better and R leg small wound is not oozing liquid now. He took Furosemide 40 mg x 1 d only yet  The patient presents for a follow-up of  chronic hypertension, chronic dyslipidemia, type 2 pre diabetes controlled with medicines/diet, CRI. [place] doctor switched him from Crestor to Lipitor due to leg pain. He had labs there recently BP.   BP Readings from Last 3 Encounters:  10/09/12 130/70  09/13/12 152/88  09/11/12 130/70   Wt Readings from Last 3 Encounters:  10/09/12 205 lb (92.987 kg)  09/13/12 207 lb (93.895 kg)  09/11/12 209 lb (94.802 kg)    Review of Systems  Constitutional: Negative for appetite change, fatigue and unexpected weight change.  HENT: Negative for nosebleeds, congestion, sore throat, sneezing, trouble swallowing and neck pain.   Eyes: Negative for itching and visual disturbance.  Respiratory: Negative for cough.   Cardiovascular: Positive for leg swelling. Negative for chest pain and palpitations.  Gastrointestinal: Negative for nausea, diarrhea, blood in stool and abdominal distention.  Genitourinary: Negative for frequency and hematuria.  Musculoskeletal: Positive for arthralgias and gait problem. Negative for back pain and joint swelling.  Skin: Negative for rash.  Neurological: Negative for dizziness, tremors, speech difficulty and weakness.  Psychiatric/Behavioral: Negative for sleep disturbance, dysphoric mood and agitation. The patient is not nervous/anxious.        Objective:   Physical Exam  Constitutional: He is oriented to person, place, and time. He appears well-developed.  HENT:  Mouth/Throat: Oropharynx is clear and moist.  Eyes: Conjunctivae are normal. Pupils are equal, round, and reactive to light.  Neck: Normal range of motion. No JVD present. No thyromegaly present.   Cardiovascular: Normal rate, regular rhythm, normal heart sounds and intact distal pulses.  Exam reveals no gallop and no friction rub.   No murmur heard. Pulmonary/Chest: Effort normal and breath sounds normal. No respiratory distress. He has no wheezes. He has no rales. He exhibits no tenderness.  Abdominal: Soft. Bowel sounds are normal. He exhibits no distension and no mass. There is no tenderness. There is no rebound and no guarding.  Genitourinary:  declined  Musculoskeletal: Normal range of motion. He exhibits edema. He exhibits no tenderness.  Lymphadenopathy:    He has no cervical adenopathy.  Neurological: He is alert and oriented to person, place, and time. He has normal reflexes. No cranial nerve deficit. He exhibits normal muscle tone. Coordination normal.  Skin: Skin is warm and dry. No rash noted.  Psychiatric: He has a normal mood and affect. His behavior is normal. Judgment and thought content normal.  cracked heels, fingers B LE with trace edema   Lab Results  Component Value Date   WBC 6.3 01/03/2010   HGB 14.0 01/03/2010   HCT 41.4 01/03/2010   PLT 158.0 01/03/2010   GLUCOSE 89 09/11/2012   CHOL 132 01/01/2012   TRIG 59.0 01/01/2012   HDL 54.10 01/01/2012   LDLCALC 66 01/01/2012   ALT 18 01/01/2012   AST 24 01/01/2012   NA 140 09/11/2012   K 4.7 09/11/2012   CL 106 09/11/2012   CREATININE 1.7* 09/11/2012   BUN 27* 09/11/2012   CO2 26 09/11/2012   TSH 0.16* 09/11/2012   PSA 0.49 09/30/2009   HGBA1C 6.2 01/01/2012   Doppler US ECHO  Assessment & Plan:

## 2012-10-09 NOTE — Assessment & Plan Note (Signed)
Doing well 

## 2012-10-09 NOTE — Assessment & Plan Note (Signed)
BMET 

## 2012-10-09 NOTE — Assessment & Plan Note (Signed)
D/c amlodipine Cont diuretic ECHO and ven doppler were ok

## 2013-01-09 ENCOUNTER — Other Ambulatory Visit (INDEPENDENT_AMBULATORY_CARE_PROVIDER_SITE_OTHER): Payer: Medicare Other

## 2013-01-09 ENCOUNTER — Ambulatory Visit (INDEPENDENT_AMBULATORY_CARE_PROVIDER_SITE_OTHER): Payer: Medicare Other | Admitting: Internal Medicine

## 2013-01-09 ENCOUNTER — Encounter: Payer: Self-pay | Admitting: Internal Medicine

## 2013-01-09 VITALS — BP 132/70 | HR 52 | Temp 97.2°F | Resp 12 | Wt 202.0 lb

## 2013-01-09 DIAGNOSIS — E875 Hyperkalemia: Secondary | ICD-10-CM

## 2013-01-09 DIAGNOSIS — R609 Edema, unspecified: Secondary | ICD-10-CM

## 2013-01-09 DIAGNOSIS — Z23 Encounter for immunization: Secondary | ICD-10-CM

## 2013-01-09 DIAGNOSIS — R252 Cramp and spasm: Secondary | ICD-10-CM

## 2013-01-09 DIAGNOSIS — I2510 Atherosclerotic heart disease of native coronary artery without angina pectoris: Secondary | ICD-10-CM

## 2013-01-09 LAB — BASIC METABOLIC PANEL
BUN: 25 mg/dL — ABNORMAL HIGH (ref 6–23)
GFR: 38.85 mL/min — ABNORMAL LOW (ref >60.00)
Potassium: 4.5 mEq/L (ref 3.5–5.1)
Sodium: 136 mEq/L (ref 135–145)

## 2013-01-09 LAB — CK: Total CK: 122 U/L (ref 7–232)

## 2013-01-09 MED ORDER — TIZANIDINE HCL 4 MG PO TABS
2.0000 mg | ORAL_TABLET | Freq: Three times a day (TID) | ORAL | Status: DC | PRN
Start: 2013-01-09 — End: 2015-01-22

## 2013-01-09 NOTE — Assessment & Plan Note (Signed)
Continue with current prescription therapy as reflected on the Med list.  

## 2013-01-09 NOTE — Assessment & Plan Note (Signed)
resolved 

## 2013-01-09 NOTE — Assessment & Plan Note (Addendum)
Labs Hold Lipitor Zanaflex prn

## 2013-01-09 NOTE — Assessment & Plan Note (Signed)
BMET 

## 2013-01-09 NOTE — Progress Notes (Signed)
   Subjective:    HPI C/o cramps F/u leg swelling - better and R leg small wound is not oozing liquid now. He took Furosemide 40 mg x 1 d only yet  The patient presents for a follow-up of  chronic hypertension, chronic dyslipidemia, type 2 pre diabetes controlled with medicines/diet, CRI. [place] doctor switched him from Crestor to Lipitor due to leg pain. He had labs there recently BP.   BP Readings from Last 3 Encounters:  01/09/13 132/70  10/09/12 130/70  09/13/12 152/88   Wt Readings from Last 3 Encounters:  01/09/13 202 lb (91.627 kg)  10/09/12 205 lb (92.987 kg)  09/13/12 207 lb (93.895 kg)    Review of Systems  Constitutional: Negative for appetite change, fatigue and unexpected weight change.  HENT: Negative for nosebleeds, congestion, sore throat, sneezing, trouble swallowing and neck pain.   Eyes: Negative for itching and visual disturbance.  Respiratory: Negative for cough.   Cardiovascular: Positive for leg swelling. Negative for chest pain and palpitations.  Gastrointestinal: Negative for nausea, diarrhea, blood in stool and abdominal distention.  Genitourinary: Negative for frequency and hematuria.  Musculoskeletal: Positive for arthralgias and gait problem. Negative for back pain and joint swelling.  Skin: Negative for rash.  Neurological: Negative for dizziness, tremors, speech difficulty and weakness.  Psychiatric/Behavioral: Negative for sleep disturbance, dysphoric mood and agitation. The patient is not nervous/anxious.        Objective:   Physical Exam  Constitutional: He is oriented to person, place, and time. He appears well-developed.  HENT:  Mouth/Throat: Oropharynx is clear and moist.  Eyes: Conjunctivae are normal. Pupils are equal, round, and reactive to light.  Neck: Normal range of motion. No JVD present. No thyromegaly present.  Cardiovascular: Normal rate, regular rhythm, normal heart sounds and intact distal pulses.  Exam reveals no gallop and no  friction rub.   No murmur heard. Pulmonary/Chest: Effort normal and breath sounds normal. No respiratory distress. He has no wheezes. He has no rales. He exhibits no tenderness.  Abdominal: Soft. Bowel sounds are normal. He exhibits no distension and no mass. There is no tenderness. There is no rebound and no guarding.  Genitourinary:  declined  Musculoskeletal: Normal range of motion. He exhibits edema. He exhibits no tenderness.  Lymphadenopathy:    He has no cervical adenopathy.  Neurological: He is alert and oriented to person, place, and time. He has normal reflexes. No cranial nerve deficit. He exhibits normal muscle tone. Coordination normal.  Skin: Skin is warm and dry. No rash noted.  Psychiatric: He has a normal mood and affect. His behavior is normal. Judgment and thought content normal.  cracked heels, fingers B LE with trace edema   Lab Results  Component Value Date   WBC 8.6 10/09/2012   HGB 14.3 10/09/2012   HCT 42.3 10/09/2012   PLT 158.0 10/09/2012   GLUCOSE 120* 10/09/2012   CHOL 132 01/01/2012   TRIG 59.0 01/01/2012   HDL 54.10 01/01/2012   LDLCALC 66 01/01/2012   ALT 18 01/01/2012   AST 24 01/01/2012   NA 135 10/09/2012   K 4.2 10/09/2012   CL 96 10/09/2012   CREATININE 1.9* 10/09/2012   BUN 35* 10/09/2012   CO2 29 10/09/2012   TSH 0.16* 09/11/2012   PSA 0.68 10/09/2012   HGBA1C 6.2 01/01/2012   Doppler US ECHO     Assessment & Plan:

## 2013-01-16 ENCOUNTER — Ambulatory Visit (INDEPENDENT_AMBULATORY_CARE_PROVIDER_SITE_OTHER): Payer: Medicare Other | Admitting: Cardiology

## 2013-01-16 ENCOUNTER — Encounter: Payer: Self-pay | Admitting: Cardiology

## 2013-01-16 VITALS — BP 130/70 | HR 73 | Ht 68.5 in | Wt 207.0 lb

## 2013-01-16 DIAGNOSIS — I5032 Chronic diastolic (congestive) heart failure: Secondary | ICD-10-CM

## 2013-01-16 DIAGNOSIS — I2510 Atherosclerotic heart disease of native coronary artery without angina pectoris: Secondary | ICD-10-CM

## 2013-01-16 DIAGNOSIS — I459 Conduction disorder, unspecified: Secondary | ICD-10-CM

## 2013-01-16 DIAGNOSIS — I6529 Occlusion and stenosis of unspecified carotid artery: Secondary | ICD-10-CM

## 2013-01-16 DIAGNOSIS — I509 Heart failure, unspecified: Secondary | ICD-10-CM

## 2013-01-16 DIAGNOSIS — E785 Hyperlipidemia, unspecified: Secondary | ICD-10-CM

## 2013-01-16 DIAGNOSIS — I739 Peripheral vascular disease, unspecified: Secondary | ICD-10-CM

## 2013-01-16 MED ORDER — FUROSEMIDE 20 MG PO TABS: 20.0000 mg | ORAL_TABLET | Freq: Every day | ORAL | Status: DC

## 2013-01-16 MED ORDER — PRAVASTATIN SODIUM 20 MG PO TABS: 20.0000 mg | ORAL_TABLET | Freq: Every evening | ORAL | Status: DC

## 2013-01-16 NOTE — Patient Instructions (Addendum)
Your physician has recommended you make the following change in your medication:  Lasix 20mg  daily                                                                                    pravastatin 20mg  each evening  Your physician has recommended that you wear a 48 hour holter monitor. Holter monitors are medical devices that record the heart's electrical activity. Doctors most often use these monitors to diagnose arrhythmias. Arrhythmias are problems with the speed or rhythm of the heartbeat. The monitor is a small, portable device. You can wear one while you do your normal daily activities. This is usually used to diagnose what is causing palpitations/syncope (passing out).  Your physician has requested that you have a carotid duplex October . This test is an ultrasound of the carotid arteries in your neck. It looks at blood flow through these arteries that supply the brain with blood. Allow one hour for this exam. There are no restrictions or special instructions.  Your physician recommends that you return for lab work next Friday for a bmp,bnp  Your physician recommends that you return for lab work in: 2 months for fasting cholesterol lab work  Your physician recommends that you schedule a follow-up appointment in: 2 months with Dr. Aundra Dubin

## 2013-01-18 DIAGNOSIS — I5032 Chronic diastolic (congestive) heart failure: Secondary | ICD-10-CM | POA: Insufficient documentation

## 2013-01-18 NOTE — Progress Notes (Signed)
Patient ID: Allen Holloway, male   DOB: 12-07-1926, 79 y.o.   MRN: [id number] PCP: Dr. Alain Marion  79 yo with history of CAD s/p CABG in 2005 with nonischemic myoview in 2008 presents for cardiology evaluation. Last echo in 6/14 showed EF 50-55% with mild aortic stenosis.  Patient has been doing well recently. No chest pain.  No exertional dyspnea.  He stopped Lipitor due to myalgias.  ECG today actually shows Type I 2nd degree AV block.  He denies lightheadedness or syncope.   Labs (6/11): K 5.3, creatinine 1.6, LDL 70, HDL 58, HCT 37, TSH normal  Labs (11/12): K 4.5, creatinine 1.9, LDL 77, HDL 60 Labs (5/14): BNP 296 Labs (9/14): K 4.5, creatinine 1.8  ECG: NSR, type I 2nd degree AV block with left axis.   Allergies (verified):  1) Lotensin (Benazepril Hcl)   Past Medical History:  1. COPD  2. Coronary artery disease: CABG 2005 with LIMA-LAD, SVG-PDA, SVG-D1, SVG-OM. Myoview (9/08) showed EF 63%, no scar or ischemia.  Echo (8/11): EF 55%, mild AI, mild MR, moderate TR, mildly dilated RV, PA systolic pressure 34 mmHg.  Echo (6/14): EF 50-55%, mild LVH, mild AS, mild MR, mildly dilated RV.  3. Hyperlipidemia: Myalgias Lipitor and Crestor.  4. Hypertension  5. Carotid bruit: Carotid dopplers (8/12) with [id number] RICA, [id number] LICA.  Carotid dopplers (4/14) with 60-79% bilateral ICA stenosis.  6. Cholelithiasis  7. CKD  8. Low back pain  9. Osteoarthritis  10. GERD  11. Hypothyroidism  12. ABIs normal 8/11.  13. 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 Chronic diastolic CHF 15. Aortic stenosis: Mild on 6/14 echo.   Family History:  Family History High cholesterol  Family History Hypertension   Social History:  Retired  Married  Former Smoker   Review of [place] reviewed and negative except as per HPI.   Current Outpatient Prescriptions  Medication Sig Dispense Refill  . aspirin 325 MG tablet Take 325 mg by mouth daily. TAKE 1/2 TAB      . atorvastatin (LIPITOR) 20 MG  tablet Take 1 tablet (20 mg total) by mouth daily.  90 tablet  3  . Cholecalciferol (VITAMIN D3) 1000 UNITS tablet Take 1,000 Units by mouth daily.        . Coenzyme Q10 200 MG TABS Take by mouth.    0  . furosemide (LASIX) 20 MG tablet Take 1-2 tablets (20-40 mg total) by mouth daily.  90 tablet  3  . levothyroxine (SYNTHROID, LEVOTHROID) 100 MCG tablet Take 100 mcg by mouth daily.        . metoprolol succinate (TOPROL-XL) 25 MG 24 hr tablet Take 1 tablet (25 mg total) by mouth daily.  90 tablet  3  . omeprazole (PRILOSEC) 20 MG capsule Take 40 mg by mouth daily.        Marland Kitchen tiZANidine (ZANAFLEX) 4 MG tablet Take 0.5-1 tablets (2-4 mg total) by mouth every 8 (eight) hours as needed (cramps).  60 tablet  1  . pravastatin (PRAVACHOL) 20 MG tablet Take 1 tablet (20 mg total) by mouth every evening.  90 tablet  3   No current facility-administered medications for this visit.    BP 130/70  Pulse 73  Ht 5' 8.5" (1.74 m)  Wt 93.895 kg (207 lb)  BMI 31.01 kg/m2 General: NAD Neck: JVP 8-9 cm, no thyromegaly or thyroid nodule.  Lungs: Clear to auscultation bilaterally with normal respiratory effort. CV: Nondisplaced PMI.  Heart regular  S1/S2, no S3/S4, no murmur.  1+ edema 1/2 to knees bilaterally .  No carotid bruit.  2+ DP pulses bilaterally.   Abdomen: Soft, nontender, no hepatosplenomegaly, no distention.  Neurologic: Alert and oriented x 3.  Psych: Normal affect. Extremities: No clubbing or cyanosis.   Assessment/Plan: 1. CAD: Stable without chest pain.  Continue ASA, Toprol XL.  He has not been able to tolerate Lipitor or Crestor due to myalgias.  2. Chronic diastolic CHF: Patient does appear mildly volume overloaded on exam.  I will have him take Lasix 20 mg daily.  BMET/BNP in 10 days.  3. Hyperlipidemia: Myalgias with Crestor and Lipitor.  I am going to have him try a low dose of pravastatin, 20 mg daily.  Will have him continue coenzyme Q 10 200 mg daily.  He will need lipids/LFTs in 2  months.  4. Carotid stenosis: Now 60-79% bilateral ICA stenosis.  Repeat carotids in 10/14.  5. 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  He denies lightheaded spells. I will get 48 hour holter monitor to make sure that he does not have more significant heart block.  6. CKD: Follow closely with initiation of Lasix.   Loralie Champagne 01/18/2013

## 2013-01-24 ENCOUNTER — Encounter: Payer: Self-pay | Admitting: Radiology

## 2013-01-24 ENCOUNTER — Encounter (INDEPENDENT_AMBULATORY_CARE_PROVIDER_SITE_OTHER): Payer: Medicare Other

## 2013-01-24 ENCOUNTER — Other Ambulatory Visit (INDEPENDENT_AMBULATORY_CARE_PROVIDER_SITE_OTHER): Payer: Medicare Other

## 2013-01-24 DIAGNOSIS — E785 Hyperlipidemia, unspecified: Secondary | ICD-10-CM

## 2013-01-24 DIAGNOSIS — I459 Conduction disorder, unspecified: Secondary | ICD-10-CM

## 2013-01-24 DIAGNOSIS — I2510 Atherosclerotic heart disease of native coronary artery without angina pectoris: Secondary | ICD-10-CM

## 2013-01-24 LAB — BASIC METABOLIC PANEL
BUN: 24 mg/dL — ABNORMAL HIGH (ref 6–23)
CO2: 29 mEq/L (ref 19–32)
Calcium: 9.6 mg/dL (ref 8.4–10.5)
Creatinine, Ser: 1.8 mg/dL — ABNORMAL HIGH (ref 0.4–1.5)
GFR: 38.84 mL/min — ABNORMAL LOW (ref >60.00)
Glucose, Bld: 111 mg/dL — ABNORMAL HIGH (ref 70–99)
Sodium: 137 mEq/L (ref 135–145)

## 2013-01-24 NOTE — Progress Notes (Signed)
Patient ID: Allen Holloway, male   DOB: Mar 09, 1927, 79 y.o.   MRN: [id number] E Cardio 48hr Holter monitor applied

## 2013-01-30 ENCOUNTER — Telehealth: Payer: Self-pay | Admitting: *Deleted

## 2013-01-30 NOTE — Telephone Encounter (Signed)
Dr Aundra Dubin reviewed monitor done 01/24/13 PACs. No sig arrhythmias. Pt notified.

## 2013-02-11 ENCOUNTER — Encounter (HOSPITAL_COMMUNITY): Payer: Medicare Other

## 2013-02-18 ENCOUNTER — Encounter (INDEPENDENT_AMBULATORY_CARE_PROVIDER_SITE_OTHER): Payer: Self-pay

## 2013-02-18 ENCOUNTER — Ambulatory Visit (HOSPITAL_COMMUNITY): Payer: Medicare Other | Attending: Cardiology

## 2013-02-18 DIAGNOSIS — Z87891 Personal history of nicotine dependence: Secondary | ICD-10-CM | POA: Insufficient documentation

## 2013-02-18 DIAGNOSIS — I2510 Atherosclerotic heart disease of native coronary artery without angina pectoris: Secondary | ICD-10-CM | POA: Insufficient documentation

## 2013-02-18 DIAGNOSIS — R0989 Other specified symptoms and signs involving the circulatory and respiratory systems: Secondary | ICD-10-CM | POA: Insufficient documentation

## 2013-02-18 DIAGNOSIS — I6529 Occlusion and stenosis of unspecified carotid artery: Secondary | ICD-10-CM | POA: Insufficient documentation

## 2013-02-18 DIAGNOSIS — Z951 Presence of aortocoronary bypass graft: Secondary | ICD-10-CM | POA: Insufficient documentation

## 2013-02-18 DIAGNOSIS — J4489 Other specified chronic obstructive pulmonary disease: Secondary | ICD-10-CM | POA: Insufficient documentation

## 2013-02-18 DIAGNOSIS — I10 Essential (primary) hypertension: Secondary | ICD-10-CM | POA: Insufficient documentation

## 2013-02-18 DIAGNOSIS — I658 Occlusion and stenosis of other precerebral arteries: Secondary | ICD-10-CM | POA: Insufficient documentation

## 2013-02-18 DIAGNOSIS — E785 Hyperlipidemia, unspecified: Secondary | ICD-10-CM | POA: Insufficient documentation

## 2013-02-18 DIAGNOSIS — J449 Chronic obstructive pulmonary disease, unspecified: Secondary | ICD-10-CM | POA: Insufficient documentation

## 2013-02-18 DIAGNOSIS — I739 Peripheral vascular disease, unspecified: Secondary | ICD-10-CM | POA: Insufficient documentation

## 2013-03-17 ENCOUNTER — Ambulatory Visit (INDEPENDENT_AMBULATORY_CARE_PROVIDER_SITE_OTHER): Payer: Medicare Other | Admitting: Cardiology

## 2013-03-17 ENCOUNTER — Encounter: Payer: Self-pay | Admitting: Cardiology

## 2013-03-17 ENCOUNTER — Other Ambulatory Visit: Payer: Medicare Other

## 2013-03-17 VITALS — BP 132/75 | HR 69 | Ht 68.0 in | Wt 206.4 lb

## 2013-03-17 DIAGNOSIS — I2510 Atherosclerotic heart disease of native coronary artery without angina pectoris: Secondary | ICD-10-CM

## 2013-03-17 DIAGNOSIS — E785 Hyperlipidemia, unspecified: Secondary | ICD-10-CM

## 2013-03-17 DIAGNOSIS — I509 Heart failure, unspecified: Secondary | ICD-10-CM

## 2013-03-17 DIAGNOSIS — I6529 Occlusion and stenosis of unspecified carotid artery: Secondary | ICD-10-CM

## 2013-03-17 DIAGNOSIS — I5032 Chronic diastolic (congestive) heart failure: Secondary | ICD-10-CM

## 2013-03-17 LAB — BASIC METABOLIC PANEL
CO2: 25 mEq/L (ref 19–32)
Calcium: 9.5 mg/dL (ref 8.4–10.5)
Chloride: 104 mEq/L (ref 96–112)
Creatinine, Ser: 2 mg/dL — ABNORMAL HIGH (ref 0.4–1.5)
Glucose, Bld: 112 mg/dL — ABNORMAL HIGH (ref 70–99)
Sodium: 137 mEq/L (ref 135–145)

## 2013-03-17 LAB — HEPATIC FUNCTION PANEL
AST: 25 U/L (ref 0–37)
Albumin: 3.8 g/dL (ref 3.5–5.2)
Alkaline Phosphatase: 85 U/L (ref 39–117)
Total Bilirubin: 0.6 mg/dL (ref 0.3–1.2)
Total Protein: 6.9 g/dL (ref 6.0–8.3)

## 2013-03-17 LAB — LIPID PANEL
Cholesterol: 147 mg/dL (ref 0–200)
LDL Cholesterol: 86 mg/dL (ref 0–99)
Total CHOL/HDL Ratio: 3
Triglycerides: 89 mg/dL (ref 0.0–149.0)

## 2013-03-17 NOTE — Patient Instructions (Signed)
Your physician recommends that you have  a FASTING lipid profile /liver profile/BMET today.   Your physician recommends that you schedule a follow-up appointment in: 4 months with Dr Aundra Dubin. (March 2015).

## 2013-03-17 NOTE — Progress Notes (Signed)
Patient ID: Allen Holloway, male   DOB: [birth year], 79 y.o.   MRN: [id number] PCP: Dr. Alain Marion  79 yo with history of CAD s/p CABG in 2005 with nonischemic myoview in 2008 presents for cardiology evaluation. Last echo in 6/14 showed EF 50-55% with mild aortic stenosis.  At last appointment, he appeared mildly volume overloaded so I started him on Lasix 20 mg daily.  Since then, his ankle edema has gone down considerably.  Patient has been doing well recently. No chest pain.  No exertional dyspnea.  He is tolerating pravastatin without myalgias.  Holter in 10/14 showed PACs but no significant arrhythmias.  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  He denies lightheadedness or syncope. Weight is down 1 lb.   Labs (6/11): K 5.3, creatinine 1.6, LDL 70, HDL 58, HCT 37, TSH normal  Labs (11/12): K 4.5, creatinine 1.9, LDL 77, HDL 60 Labs (5/14): BNP 296 Labs (9/14): K 4.5, creatinine 1.8 Labs (10/14): K 4.6, creatinine 1.8, BNP 203  Allergies (verified):  1) Lotensin (Benazepril Hcl)   Past Medical History:  1. COPD  2. Coronary artery disease: CABG 2005 with LIMA-LAD, SVG-PDA, SVG-D1, SVG-OM. Myoview (9/08) showed EF 63%, no scar or ischemia.  Echo (8/11): EF 55%, mild AI, mild MR, moderate TR, mildly dilated RV, PA systolic pressure 34 mmHg.  Echo (6/14): EF 50-55%, mild LVH, mild AS, mild MR, mildly dilated RV.  3. Hyperlipidemia: Myalgias Lipitor and Crestor.  4. Hypertension  5. Carotid bruit: Carotid dopplers (8/12) with [id number] RICA, [id number] LICA.  Carotid dopplers (4/14) with 60-79% bilateral ICA stenosis.  Carotid dopplers (10/14) with 60-79% bilateral ICA stenosis.  6. Cholelithiasis  7. CKD  8. Low back pain  9. Osteoarthritis  10. GERD  11. Hypothyroidism  12. ABIs normal 8/11.  13. 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 Chronic diastolic CHF 15. Aortic stenosis: Mild on 6/14 echo.   Family History:  Family History High cholesterol  Family History Hypertension   Social  History:  Retired  Married  Former Smoker   Current Outpatient Prescriptions  Medication Sig Dispense Refill  . aspirin 325 MG tablet Take 325 mg by mouth daily. TAKE 1/2 TAB      . Cholecalciferol (VITAMIN D3) 1000 UNITS tablet Take 1,000 Units by mouth daily.        . Coenzyme Q10 200 MG TABS Take by mouth.    0  . furosemide (LASIX) 20 MG tablet Take 1-2 tablets (20-40 mg total) by mouth daily.  90 tablet  3  . levothyroxine (SYNTHROID, LEVOTHROID) 100 MCG tablet Take 100 mcg by mouth daily.        . metoprolol succinate (TOPROL-XL) 25 MG 24 hr tablet Take 1 tablet (25 mg total) by mouth daily.  90 tablet  3  . omeprazole (PRILOSEC) 20 MG capsule Take 40 mg by mouth daily.        . pravastatin (PRAVACHOL) 20 MG tablet Take 1 tablet (20 mg total) by mouth every evening.  90 tablet  3  . tiZANidine (ZANAFLEX) 4 MG tablet Take 0.5-1 tablets (2-4 mg total) by mouth every 8 (eight) hours as needed (cramps).  60 tablet  1  . atorvastatin (LIPITOR) 20 MG tablet Take 1 tablet (20 mg total) by mouth daily.  90 tablet  3   No current facility-administered medications for this visit.    BP 132/75  Pulse 69  Ht 5\' 8"  (1.727 m)  Wt  93.622 kg (206 lb 6.4 oz)  BMI 31.39 kg/m2 General: NAD Neck: No JVD, no thyromegaly or thyroid nodule.  Lungs: Clear to auscultation bilaterally with normal respiratory effort. CV: Nondisplaced PMI.  Heart regular S1/S2, no S3/S4, no murmur.  1+ edema at ankles bilaterally .  No carotid bruit.  2+ DP pulses bilaterally.   Abdomen: Soft, nontender, no hepatosplenomegaly, no distention.  Neurologic: Alert and oriented x 3.  Psych: Normal affect. Extremities: No clubbing or cyanosis.   Assessment/Plan: 1. CAD: Stable without chest pain.  Continue ASA, Toprol XL.  He has not been able to tolerate Lipitor or Crestor due to myalgias but he is tolerating pravastatin.  2. Chronic diastolic CHF: Volume status improved on Lasix.  No JVD.  BMET today.   3.  Hyperlipidemia: Myalgias with Crestor and Lipitor.  He has tolerated pravastatin 20.  Will check lipids/LFTs, will titrate up pravastatin if needed.  4. Carotid stenosis: Now 60-79% bilateral ICA stenosis.  Repeat carotids in 4/14.  5. 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  No lightheadedness.  Holter did not show any arrhythmias.  Will continue to monitor.  6. CKD: Stable after initiation of Lasix.    Loralie Champagne 03/17/2013

## 2013-04-25 ENCOUNTER — Ambulatory Visit: Payer: Medicare Other | Admitting: Internal Medicine

## 2013-04-28 ENCOUNTER — Ambulatory Visit (INDEPENDENT_AMBULATORY_CARE_PROVIDER_SITE_OTHER): Payer: Medicare Other | Admitting: Internal Medicine

## 2013-04-28 ENCOUNTER — Encounter: Payer: Self-pay | Admitting: Internal Medicine

## 2013-04-28 VITALS — BP 140/78 | HR 80 | Temp 97.5°F | Resp 16 | Wt 211.0 lb

## 2013-04-28 DIAGNOSIS — I10 Essential (primary) hypertension: Secondary | ICD-10-CM

## 2013-04-28 DIAGNOSIS — E785 Hyperlipidemia, unspecified: Secondary | ICD-10-CM

## 2013-04-28 DIAGNOSIS — R609 Edema, unspecified: Secondary | ICD-10-CM

## 2013-04-28 DIAGNOSIS — E039 Hypothyroidism, unspecified: Secondary | ICD-10-CM

## 2013-04-28 DIAGNOSIS — N259 Disorder resulting from impaired renal tubular function, unspecified: Secondary | ICD-10-CM

## 2013-04-28 DIAGNOSIS — I2510 Atherosclerotic heart disease of native coronary artery without angina pectoris: Secondary | ICD-10-CM

## 2013-04-28 NOTE — Assessment & Plan Note (Signed)
Labs

## 2013-04-28 NOTE — Assessment & Plan Note (Signed)
Continue with current prescription therapy as reflected on the Med list.  

## 2013-04-28 NOTE — Progress Notes (Signed)
   Subjective:    HPI F/u cramps - resolved F/u leg swelling - better and R leg small wound is not oozing liquid now. He took Furosemide 40 mg x 1 d only yet  The patient presents for a follow-up of  chronic hypertension, chronic dyslipidemia, type 2 pre diabetes controlled with medicines/diet, CRI. [place] doctor switched him from Crestor to Lipitor due to leg pain. He had labs there recently BP.   BP Readings from Last 3 Encounters:  04/28/13 140/78  03/17/13 132/75  01/16/13 130/70   Wt Readings from Last 3 Encounters:  04/28/13 211 lb (95.709 kg)  03/17/13 206 lb 6.4 oz (93.622 kg)  01/16/13 207 lb (93.895 kg)    Review of Systems  Constitutional: Negative for appetite change, fatigue and unexpected weight change.  HENT: Negative for congestion, nosebleeds, sneezing, sore throat and trouble swallowing.   Eyes: Negative for itching and visual disturbance.  Respiratory: Negative for cough.   Cardiovascular: Positive for leg swelling. Negative for chest pain and palpitations.  Gastrointestinal: Negative for nausea, diarrhea, blood in stool and abdominal distention.  Genitourinary: Negative for frequency and hematuria.  Musculoskeletal: Positive for arthralgias and gait problem. Negative for back pain, joint swelling and neck pain.  Skin: Negative for rash.  Neurological: Negative for dizziness, tremors, speech difficulty and weakness.  Psychiatric/Behavioral: Negative for sleep disturbance, dysphoric mood and agitation. The patient is not nervous/anxious.        Objective:   Physical Exam  Constitutional: He is oriented to person, place, and time. He appears well-developed.  HENT:  Mouth/Throat: Oropharynx is clear and moist.  Eyes: Conjunctivae are normal. Pupils are equal, round, and reactive to light.  Neck: Normal range of motion. No JVD present. No thyromegaly present.  Cardiovascular: Normal rate, regular rhythm, normal heart sounds and intact distal pulses.  Exam reveals  no gallop and no friction rub.   No murmur heard. Pulmonary/Chest: Effort normal and breath sounds normal. No respiratory distress. He has no wheezes. He has no rales. He exhibits no tenderness.  Abdominal: Soft. Bowel sounds are normal. He exhibits no distension and no mass. There is no tenderness. There is no rebound and no guarding.  Genitourinary:  declined  Musculoskeletal: Normal range of motion. He exhibits edema. He exhibits no tenderness.  Lymphadenopathy:    He has no cervical adenopathy.  Neurological: He is alert and oriented to person, place, and time. He has normal reflexes. No cranial nerve deficit. He exhibits normal muscle tone. Coordination normal.  Skin: Skin is warm and dry. No rash noted.  Psychiatric: He has a normal mood and affect. His behavior is normal. Judgment and thought content normal.  cracked heels, fingers B LE with no edema   Lab Results  Component Value Date   WBC 8.6 10/09/2012   HGB 14.3 10/09/2012   HCT 42.3 10/09/2012   PLT 158.0 10/09/2012   GLUCOSE 112* 03/17/2013   CHOL 147 03/17/2013   TRIG 89.0 03/17/2013   HDL 42.80 03/17/2013   LDLCALC 86 03/17/2013   ALT 19 03/17/2013   AST 25 03/17/2013   NA 137 03/17/2013   K 4.8 03/17/2013   CL 104 03/17/2013   CREATININE 2.0* 03/17/2013   BUN 31* 03/17/2013   CO2 25 03/17/2013   TSH 0.16* 09/11/2012   PSA 0.68 10/09/2012   HGBA1C 6.2 01/01/2012   Doppler US ECHO     Assessment & Plan:

## 2013-07-04 ENCOUNTER — Encounter: Payer: Self-pay | Admitting: *Deleted

## 2013-07-14 ENCOUNTER — Encounter: Payer: Self-pay | Admitting: Cardiology

## 2013-07-14 ENCOUNTER — Ambulatory Visit (INDEPENDENT_AMBULATORY_CARE_PROVIDER_SITE_OTHER): Payer: Medicare Other | Admitting: Cardiology

## 2013-07-14 ENCOUNTER — Encounter: Payer: Self-pay | Admitting: *Deleted

## 2013-07-14 VITALS — BP 142/94 | HR 59 | Ht 68.0 in | Wt 209.0 lb

## 2013-07-14 DIAGNOSIS — E785 Hyperlipidemia, unspecified: Secondary | ICD-10-CM

## 2013-07-14 DIAGNOSIS — I509 Heart failure, unspecified: Secondary | ICD-10-CM

## 2013-07-14 DIAGNOSIS — I2510 Atherosclerotic heart disease of native coronary artery without angina pectoris: Secondary | ICD-10-CM

## 2013-07-14 DIAGNOSIS — I5032 Chronic diastolic (congestive) heart failure: Secondary | ICD-10-CM

## 2013-07-14 DIAGNOSIS — I739 Peripheral vascular disease, unspecified: Secondary | ICD-10-CM

## 2013-07-14 DIAGNOSIS — N259 Disorder resulting from impaired renal tubular function, unspecified: Secondary | ICD-10-CM

## 2013-07-14 DIAGNOSIS — I6529 Occlusion and stenosis of unspecified carotid artery: Secondary | ICD-10-CM

## 2013-07-14 NOTE — Patient Instructions (Signed)
Your physician recommends that you have lab work today--BMET.  Your physician has requested that you have a lower extremity arterial duplex. This test is an ultrasound of the arteries in the legs. It looks at arterial blood flow in the legs. Allow one hour for Lower Arterial scans. There are no restrictions or special instructions  Your physician has requested that you have a carotid duplex. This test is an ultrasound of the carotid arteries in your neck. It looks at blood flow through these arteries that supply the brain with blood. Allow one hour for this exam. There are no restrictions or special instructions. April 2015  Your physician wants you to follow-up in: 6 months with Dr Aundra Dubin. (September 2015).  You will receive a reminder letter in the mail two months in advance. If you don't receive a letter, please call our office to schedule the follow-up appointment.

## 2013-07-15 ENCOUNTER — Ambulatory Visit: Payer: Medicare Other | Admitting: Cardiology

## 2013-07-15 LAB — BASIC METABOLIC PANEL
BUN: 30 mg/dL — ABNORMAL HIGH (ref 6–23)
CALCIUM: 9.8 mg/dL (ref 8.4–10.5)
CO2: 33 meq/L — AB (ref 19–32)
CREATININE: 1.7 mg/dL — AB (ref 0.4–1.5)
Chloride: 103 mEq/L (ref 96–112)
GFR: 40.64 mL/min — ABNORMAL LOW (ref >60.00)
Glucose, Bld: 84 mg/dL (ref 70–99)
Potassium: 5 mEq/L (ref 3.5–5.1)
SODIUM: 138 meq/L (ref 135–145)

## 2013-07-16 NOTE — Progress Notes (Signed)
Patient ID: Allen Holloway, male   DOB: June 30, 1926, 80 y.o.   MRN: [id number] PCP: Dr. Alain Marion  80 yo with history of CAD s/p CABG in 2005 with nonischemic myoview in 2008 presents for cardiology evaluation. Last echo in 6/14 showed EF 50-55% with mild aortic stenosis.  He has had no chest pain.  No dyspnea walking on flat ground.  Mild dyspnea with steps.  No orthopnea/PND.  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  He denies lightheadedness or syncope. He has occasional mild myalgias but on the whole seems to be tolerating pravastatin.    Labs (6/11): K 5.3, creatinine 1.6, LDL 70, HDL 58, HCT 37, TSH normal  Labs (11/12): K 4.5, creatinine 1.9, LDL 77, HDL 60 Labs (5/14): BNP 296 Labs (9/14): K 4.5, creatinine 1.8 Labs (10/14): K 4.6, creatinine 1.8, BNP 203 Labs (11/14): LDL 86, HDL 43, K 4.8, creatinine 2.0  ECG: NSR, 1st degree AV block 256 msec, left axis deviation  Allergies (verified):  1) Lotensin (Benazepril Hcl)   Past Medical History:  1. COPD  2. Coronary artery disease: CABG 2005 with LIMA-LAD, SVG-PDA, SVG-D1, SVG-OM. Myoview (9/08) showed EF 63%, no scar or ischemia.  Echo (8/11): EF 55%, mild AI, mild MR, moderate TR, mildly dilated RV, PA systolic pressure 34 mmHg.  Echo (6/14): EF 50-55%, mild LVH, mild AS, mild MR, mildly dilated RV.  3. Hyperlipidemia: Myalgias Lipitor and Crestor.  4. Hypertension  5. Carotid bruit: Carotid dopplers (8/12) with [id number] RICA, [id number] LICA.  Carotid dopplers (4/14) with 60-79% bilateral ICA stenosis.  Carotid dopplers (10/14) with 60-79% bilateral ICA stenosis.  6. Cholelithiasis  7. CKD  8. Low back pain  9. Osteoarthritis  10. GERD  11. Hypothyroidism  12. ABIs normal 8/11.  13. 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 Chronic diastolic CHF 15. Aortic stenosis: Mild on 6/14 echo.   Family History:  Family History High cholesterol  Family History Hypertension   Social History:  Retired  Married  Former Smoker   ROS:  All systems reviewed and negative except as per HPI.   Current Outpatient Prescriptions  Medication Sig Dispense Refill  . aspirin 325 MG tablet Take 325 mg by mouth daily. TAKE 1/2 TAB      . atorvastatin (LIPITOR) 20 MG tablet Take 1 tablet (20 mg total) by mouth daily.  90 tablet  3  . Cholecalciferol (VITAMIN D3) 1000 UNITS tablet Take 1,000 Units by mouth daily.        . Coenzyme Q10 200 MG TABS Take by mouth.    0  . furosemide (LASIX) 20 MG tablet Take 1-2 tablets (20-40 mg total) by mouth daily.  90 tablet  3  . levothyroxine (SYNTHROID, LEVOTHROID) 100 MCG tablet Take 100 mcg by mouth daily.        . metoprolol succinate (TOPROL-XL) 25 MG 24 hr tablet Take 1 tablet (25 mg total) by mouth daily.  90 tablet  3  . omeprazole (PRILOSEC) 20 MG capsule Take 40 mg by mouth daily.        . pravastatin (PRAVACHOL) 20 MG tablet Take 1 tablet (20 mg total) by mouth every evening.  90 tablet  3  . tiZANidine (ZANAFLEX) 4 MG tablet Take 0.5-1 tablets (2-4 mg total) by mouth every 8 (eight) hours as needed (cramps).  60 tablet  1   No current facility-administered medications for this visit.    BP 142/94  Pulse 59  Ht 5\' 8"  (1.727 m)  Wt 94.802 kg (209 lb)  BMI 31.79 kg/m2 General: NAD Neck: No JVD, no thyromegaly or thyroid nodule.  Lungs: Clear to auscultation bilaterally with normal respiratory effort. CV: Nondisplaced PMI.  Heart regular S1/S2, no S3/S4, no murmur.  1+ edema 1/2 to knees bilaterally.  Right carotid bruit. Difficult to palpate pedal pulses.    Abdomen: Soft, nontender, no hepatosplenomegaly, no distention.  Neurologic: Alert and oriented x 3.  Psych: Normal affect. Extremities: No clubbing or cyanosis.   Assessment/Plan: 1. CAD: Stable without chest pain.  Continue ASA, Toprol XL.  He has not been able to tolerate Lipitor or Crestor due to myalgias but he is tolerating pravastatin.  2. Chronic diastolic CHF: Stable volume on low dose Lasix.  No JVD.  BMET today.   3.  Hyperlipidemia: Myalgias with Crestor and Lipitor.  He has tolerated pravastatin 20.   4. Carotid stenosis: Repeat carotids in 4/15.  5. 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  No lightheadedness.  Prior holter did not show any arrhythmias.  Will continue to monitor.  6. CKD: Stable after initiation of Lasix.   7. PAD: Difficult to palpate pedal pulses.  Will get ABIs.   Loralie Champagne 07/16/1974

## 2013-07-18 ENCOUNTER — Encounter: Payer: Self-pay | Admitting: Cardiology

## 2013-07-18 ENCOUNTER — Ambulatory Visit (HOSPITAL_COMMUNITY): Payer: Medicare Other | Attending: Cardiology | Admitting: Cardiology

## 2013-07-18 DIAGNOSIS — I739 Peripheral vascular disease, unspecified: Secondary | ICD-10-CM

## 2013-07-18 DIAGNOSIS — I6529 Occlusion and stenosis of unspecified carotid artery: Secondary | ICD-10-CM | POA: Insufficient documentation

## 2013-07-18 DIAGNOSIS — I5032 Chronic diastolic (congestive) heart failure: Secondary | ICD-10-CM | POA: Insufficient documentation

## 2013-07-18 DIAGNOSIS — R0989 Other specified symptoms and signs involving the circulatory and respiratory systems: Secondary | ICD-10-CM

## 2013-07-18 DIAGNOSIS — I70219 Atherosclerosis of native arteries of extremities with intermittent claudication, unspecified extremity: Secondary | ICD-10-CM | POA: Insufficient documentation

## 2013-07-18 NOTE — Progress Notes (Signed)
Lower extremity ABI's completed

## 2013-07-29 ENCOUNTER — Ambulatory Visit (INDEPENDENT_AMBULATORY_CARE_PROVIDER_SITE_OTHER): Payer: Medicare Other | Admitting: Internal Medicine

## 2013-07-29 ENCOUNTER — Encounter: Payer: Self-pay | Admitting: Internal Medicine

## 2013-07-29 VITALS — BP 142/82 | HR 80 | Temp 97.1°F | Resp 16 | Wt 208.0 lb

## 2013-07-29 DIAGNOSIS — N259 Disorder resulting from impaired renal tubular function, unspecified: Secondary | ICD-10-CM

## 2013-07-29 DIAGNOSIS — I739 Peripheral vascular disease, unspecified: Secondary | ICD-10-CM

## 2013-07-29 DIAGNOSIS — E875 Hyperkalemia: Secondary | ICD-10-CM

## 2013-07-29 DIAGNOSIS — I2510 Atherosclerotic heart disease of native coronary artery without angina pectoris: Secondary | ICD-10-CM

## 2013-07-29 DIAGNOSIS — I10 Essential (primary) hypertension: Secondary | ICD-10-CM

## 2013-07-29 DIAGNOSIS — Z23 Encounter for immunization: Secondary | ICD-10-CM

## 2013-07-29 NOTE — Assessment & Plan Note (Signed)
Continue with current prescription therapy as reflected on the Med list.  

## 2013-07-29 NOTE — Assessment & Plan Note (Signed)
Labs

## 2013-07-29 NOTE — Progress Notes (Signed)
   Subjective:    HPI F/u cramps - resolved F/u leg swelling - better  The patient presents for a follow-up of  chronic hypertension, chronic dyslipidemia, type 2 pre diabetes controlled with medicines/diet, CRI. [place] doctor switched him from Crestor to Lipitor due to leg pain. He had labs there recently BP.   BP Readings from Last 3 Encounters:  07/29/13 152/82  07/14/13 142/94  04/28/13 140/78   Wt Readings from Last 3 Encounters:  07/29/13 208 lb (94.348 kg)  07/14/13 209 lb (94.802 kg)  04/28/13 211 lb (95.709 kg)    Review of Systems  Constitutional: Negative for appetite change, fatigue and unexpected weight change.  HENT: Negative for congestion, nosebleeds, sneezing, sore throat and trouble swallowing.   Eyes: Negative for itching and visual disturbance.  Respiratory: Negative for cough.   Cardiovascular: Positive for leg swelling. Negative for chest pain and palpitations.  Gastrointestinal: Negative for nausea, diarrhea, blood in stool and abdominal distention.  Genitourinary: Negative for frequency and hematuria.  Musculoskeletal: Positive for arthralgias and gait problem. Negative for back pain, joint swelling and neck pain.  Skin: Negative for rash.  Neurological: Negative for dizziness, tremors, speech difficulty and weakness.  Psychiatric/Behavioral: Negative for sleep disturbance, dysphoric mood and agitation. The patient is not nervous/anxious.        Objective:   Physical Exam  Constitutional: He is oriented to person, place, and time. He appears well-developed.  HENT:  Mouth/Throat: Oropharynx is clear and moist.  Eyes: Conjunctivae are normal. Pupils are equal, round, and reactive to light.  Neck: Normal range of motion. No JVD present. No thyromegaly present.  Cardiovascular: Normal rate, regular rhythm, normal heart sounds and intact distal pulses.  Exam reveals no gallop and no friction rub.   No murmur heard. Pulmonary/Chest: Effort normal and breath  sounds normal. No respiratory distress. He has no wheezes. He has no rales. He exhibits no tenderness.  Abdominal: Soft. Bowel sounds are normal. He exhibits no distension and no mass. There is no tenderness. There is no rebound and no guarding.  Genitourinary:  declined  Musculoskeletal: Normal range of motion. He exhibits edema. He exhibits no tenderness.  Lymphadenopathy:    He has no cervical adenopathy.  Neurological: He is alert and oriented to person, place, and time. He has normal reflexes. No cranial nerve deficit. He exhibits normal muscle tone. Coordination normal.  Skin: Skin is warm and dry. No rash noted.  Psychiatric: He has a normal mood and affect. His behavior is normal. Judgment and thought content normal.  cracked heels, fingers B LE with no edema   Lab Results  Component Value Date   WBC 8.6 10/09/2012   HGB 14.3 10/09/2012   HCT 42.3 10/09/2012   PLT 158.0 10/09/2012   GLUCOSE 84 07/14/2013   CHOL 147 03/17/2013   TRIG 89.0 03/17/2013   HDL 42.80 03/17/2013   LDLCALC 86 03/17/2013   ALT 19 03/17/2013   AST 25 03/17/2013   NA 138 07/14/2013   K 5.0 07/14/2013   CL 103 07/14/2013   CREATININE 1.7* 07/14/2013   BUN 30* 07/14/2013   CO2 33* 07/14/2013   TSH 0.16* 09/11/2012   PSA 0.68 10/09/2012   HGBA1C 6.2 01/01/2012   Doppler US ECHO     Assessment & Plan:

## 2013-07-29 NOTE — Assessment & Plan Note (Signed)
F/u w/Nephrology 

## 2013-07-29 NOTE — Assessment & Plan Note (Signed)
-

## 2013-07-29 NOTE — Progress Notes (Signed)
Pre visit review using our clinic review tool, if applicable. No additional management support is needed unless otherwise documented below in the visit note. 

## 2013-07-29 NOTE — Assessment & Plan Note (Signed)
Continue with current prescription therapy as reflected on the Med list. No CP

## 2013-07-30 ENCOUNTER — Encounter (HOSPITAL_COMMUNITY): Payer: Medicare Other

## 2013-07-30 ENCOUNTER — Telehealth: Payer: Self-pay | Admitting: Internal Medicine

## 2013-07-30 NOTE — Telephone Encounter (Signed)
Relevant patient education mailed to patient.  

## 2013-08-11 ENCOUNTER — Encounter (HOSPITAL_COMMUNITY): Payer: Medicare Other

## 2013-09-16 ENCOUNTER — Telehealth: Payer: Self-pay

## 2013-09-16 NOTE — Telephone Encounter (Signed)
Yes, OV please with AVP. If he can't help him, he will have Dr. Tamala Julian see him if Dr. Tamala Julian is available.

## 2013-09-16 NOTE — Telephone Encounter (Signed)
The patient is hoping to find out if Dr.Plotnikov administers shots for arthritis.  He states his [place] told him to find a place to get these shots done.  Would he need an ov with Dr.Plotnikov, or maybe Dr.Smith?

## 2013-09-16 NOTE — Telephone Encounter (Signed)
Error

## 2013-09-23 ENCOUNTER — Encounter (HOSPITAL_COMMUNITY): Payer: Medicare Other

## 2013-09-24 ENCOUNTER — Ambulatory Visit (HOSPITAL_COMMUNITY): Payer: Medicare Other | Attending: Cardiology | Admitting: *Deleted

## 2013-09-24 DIAGNOSIS — I5032 Chronic diastolic (congestive) heart failure: Secondary | ICD-10-CM | POA: Insufficient documentation

## 2013-09-24 DIAGNOSIS — I739 Peripheral vascular disease, unspecified: Secondary | ICD-10-CM

## 2013-09-24 DIAGNOSIS — I509 Heart failure, unspecified: Secondary | ICD-10-CM | POA: Insufficient documentation

## 2013-09-24 DIAGNOSIS — I6529 Occlusion and stenosis of unspecified carotid artery: Secondary | ICD-10-CM

## 2013-09-24 NOTE — Progress Notes (Signed)
Carotid duplex complete 

## 2013-09-25 ENCOUNTER — Telehealth: Payer: Self-pay | Admitting: Internal Medicine

## 2013-09-25 NOTE — Telephone Encounter (Signed)
Rec'd from [facility] forward 2 pages to Dr. Alain Marion

## 2013-09-25 NOTE — Progress Notes (Signed)
Quick Note:  No answer to either home or mobile number and no voicemail option. ______

## 2013-10-01 ENCOUNTER — Telehealth: Payer: Self-pay | Admitting: Cardiology

## 2013-10-01 NOTE — Telephone Encounter (Signed)
Pt given results of recent carotid doppler.

## 2013-10-01 NOTE — Telephone Encounter (Signed)
New message          Pt calling nurse back

## 2013-11-01 ENCOUNTER — Emergency Department (HOSPITAL_BASED_OUTPATIENT_CLINIC_OR_DEPARTMENT_OTHER): Payer: Medicare Other

## 2013-11-01 ENCOUNTER — Emergency Department (HOSPITAL_BASED_OUTPATIENT_CLINIC_OR_DEPARTMENT_OTHER)
Admission: EM | Admit: 2013-11-01 | Discharge: 2013-11-01 | Disposition: A | Discharge status: Home / Self Care | Payer: Medicare Other | Attending: Emergency Medicine | Admitting: Emergency Medicine

## 2013-11-01 ENCOUNTER — Encounter (HOSPITAL_BASED_OUTPATIENT_CLINIC_OR_DEPARTMENT_OTHER): Payer: Self-pay | Admitting: Emergency Medicine

## 2013-11-01 DIAGNOSIS — M1990 Unspecified osteoarthritis, unspecified site: Secondary | ICD-10-CM | POA: Insufficient documentation

## 2013-11-01 DIAGNOSIS — S3981XA Other specified injuries of abdomen, initial encounter: Secondary | ICD-10-CM | POA: Insufficient documentation

## 2013-11-01 DIAGNOSIS — J4489 Other specified chronic obstructive pulmonary disease: Secondary | ICD-10-CM | POA: Insufficient documentation

## 2013-11-01 DIAGNOSIS — E039 Hypothyroidism, unspecified: Secondary | ICD-10-CM | POA: Insufficient documentation

## 2013-11-01 DIAGNOSIS — X58XXXA Exposure to other specified factors, initial encounter: Secondary | ICD-10-CM | POA: Insufficient documentation

## 2013-11-01 DIAGNOSIS — S2239XA Fracture of one rib, unspecified side, initial encounter for closed fracture: Secondary | ICD-10-CM | POA: Insufficient documentation

## 2013-11-01 DIAGNOSIS — Z79899 Other long term (current) drug therapy: Secondary | ICD-10-CM | POA: Insufficient documentation

## 2013-11-01 DIAGNOSIS — J449 Chronic obstructive pulmonary disease, unspecified: Secondary | ICD-10-CM | POA: Insufficient documentation

## 2013-11-01 DIAGNOSIS — Y939 Activity, unspecified: Secondary | ICD-10-CM | POA: Insufficient documentation

## 2013-11-01 DIAGNOSIS — S2232XA Fracture of one rib, left side, initial encounter for closed fracture: Secondary | ICD-10-CM

## 2013-11-01 DIAGNOSIS — E785 Hyperlipidemia, unspecified: Secondary | ICD-10-CM | POA: Insufficient documentation

## 2013-11-01 DIAGNOSIS — Z7982 Long term (current) use of aspirin: Secondary | ICD-10-CM | POA: Insufficient documentation

## 2013-11-01 DIAGNOSIS — K219 Gastro-esophageal reflux disease without esophagitis: Secondary | ICD-10-CM | POA: Insufficient documentation

## 2013-11-01 DIAGNOSIS — I129 Hypertensive chronic kidney disease with stage 1 through stage 4 chronic kidney disease, or unspecified chronic kidney disease: Secondary | ICD-10-CM | POA: Insufficient documentation

## 2013-11-01 DIAGNOSIS — N189 Chronic kidney disease, unspecified: Secondary | ICD-10-CM | POA: Insufficient documentation

## 2013-11-01 DIAGNOSIS — Y929 Unspecified place or not applicable: Secondary | ICD-10-CM | POA: Insufficient documentation

## 2013-11-01 DIAGNOSIS — Z87891 Personal history of nicotine dependence: Secondary | ICD-10-CM | POA: Insufficient documentation

## 2013-11-01 DIAGNOSIS — I2510 Atherosclerotic heart disease of native coronary artery without angina pectoris: Secondary | ICD-10-CM | POA: Insufficient documentation

## 2013-11-01 DIAGNOSIS — Z951 Presence of aortocoronary bypass graft: Secondary | ICD-10-CM | POA: Insufficient documentation

## 2013-11-01 LAB — CBC WITH DIFFERENTIAL/PLATELET
Basophils Absolute: 0 10*3/uL (ref 0.0–0.1)
Basophils Relative: 0 % (ref 0–1)
Eosinophils Absolute: 0.2 10*3/uL (ref 0.0–0.7)
Eosinophils Relative: 2 % (ref 0–5)
HCT: 41.9 % (ref 39.0–52.0)
HEMOGLOBIN: 13.4 g/dL (ref 13.0–17.0)
LYMPHS ABS: 2.3 10*3/uL (ref 0.7–4.0)
LYMPHS PCT: 23 % (ref 12–46)
MCH: 29.1 pg (ref 26.0–34.0)
MCHC: 32 g/dL (ref 30.0–36.0)
MCV: 90.9 fL (ref 78.0–100.0)
Monocytes Absolute: 1 10*3/uL (ref 0.1–1.0)
Monocytes Relative: 11 % (ref 3–12)
NEUTROS PCT: 65 % (ref 43–77)
Neutro Abs: 6.4 10*3/uL (ref 1.7–7.7)
Platelets: 280 10*3/uL (ref 150–400)
RBC: 4.61 MIL/uL (ref 4.22–5.81)
RDW: 15 % (ref 11.5–15.5)
WBC: 9.9 10*3/uL (ref 4.0–10.5)

## 2013-11-01 LAB — BASIC METABOLIC PANEL
Anion gap: 13 (ref 5–15)
BUN: 26 mg/dL — AB (ref 6–23)
CHLORIDE: 100 meq/L (ref 96–112)
CO2: 28 mEq/L (ref 19–32)
CREATININE: 1.8 mg/dL — AB (ref 0.50–1.35)
Calcium: 9.9 mg/dL (ref 8.4–10.5)
GFR calc non Af Amer: 33 mL/min — ABNORMAL LOW (ref >90)
GFR, EST AFRICAN AMERICAN: 38 mL/min — AB (ref >90)
Glucose, Bld: 96 mg/dL (ref 70–99)
Potassium: 4.2 mEq/L (ref 3.7–5.3)
Sodium: 141 mEq/L (ref 137–147)

## 2013-11-01 MED ORDER — HYDROCODONE-ACETAMINOPHEN 5-325 MG PO TABS: 1.0000 | ORAL_TABLET | ORAL | Status: DC | PRN

## 2013-11-01 NOTE — ED Notes (Signed)
Pt reports productive cough and head congestion since yesterday.  Reports that last night while coughing, he started having (L) sided lower rib pain, denies CP, SOB, N/V.  Pt is tender on palpation of his (L) rib

## 2013-11-01 NOTE — Discharge Instructions (Signed)

## 2013-11-01 NOTE — ED Notes (Signed)
MD at bedside. 

## 2013-11-01 NOTE — ED Provider Notes (Signed)
CSN: [id number]     Arrival date & time 11/01/13  1324 History  This chart was scribed for Dorie Rank, MD by Irene Pap, ED Scribe. This patient was seen in room MH06/MH06 and patient care was started at 3:13 PM.   Chief Complaint  Patient presents with  . Rib Injury   The history is provided by the patient. No language interpreter was used.   HPI Comments: Allen Holloway is a 80 y.o. male with a history of COPD who presents to the Emergency Department complaining of left lower rib pain onset one week ago. He reports cough and states that it worsens the pain. He reports associated light headedness and spitting up sputum when he coughs.  He denies nausea, fever, vomiting, abdominal pain, and SOB.    Past Medical History  Diagnosis Date  . COPD (chronic obstructive pulmonary disease)   . Hyperlipidemia   . Hypertension   . Osteoarthritis   . GERD (gastroesophageal reflux disease)   . Hypothyroidism   . CAD (coronary artery disease)   . Carotid bruit   . Cholelithiasis   . CKD (chronic kidney disease)   . Lumbago   . Diverticulosis    Past Surgical History  Procedure Laterality Date  . Coronary artery bypass graft  2005    LIMA-LAD,SVG-PDA,SVG-D1,SVG-OM.  Myoview (9/08) showed EF63%,no scaror ischemia.  Marland Kitchen Appendectomy     Family History  Problem Relation Age of Onset  . Hyperlipidemia    . Hypertension    . Colon cancer Neg Hx    History  Substance Use Topics  . Smoking status: Former Research scientist (life sciences)  . Smokeless tobacco: Not on file     Comment: quit 20-21yrs ago.  . Alcohol Use: No    Review of Systems  Constitutional: Negative for fever.  HENT: Positive for congestion.   Respiratory: Positive for cough. Negative for shortness of breath.   Cardiovascular: Positive for chest pain.  Gastrointestinal: Positive for abdominal pain. Negative for nausea and vomiting.  A complete 10 system review of systems was obtained and all systems are negative except as noted in the HPI  and PMH.   Allergies  Amlodipine and Benazepril hcl  Home Medications   Prior to Admission medications   Medication Sig Start Date End Date Taking? Authorizing Provider  aspirin 325 MG tablet Take 325 mg by mouth daily. TAKE 1/2 TAB 01/09/12   Aleksei Plotnikov V, MD  atorvastatin (LIPITOR) 20 MG tablet Take 1 tablet (20 mg total) by mouth daily. 09/05/11   Aleksei Plotnikov V, MD  Cholecalciferol (VITAMIN D3) 1000 UNITS tablet Take 1,000 Units by mouth daily.      Historical Provider, MD  Coenzyme Q10 200 MG TABS Take by mouth. 05/05/11   Larey Dresser, MD  furosemide (LASIX) 20 MG tablet Take 1-2 tablets (20-40 mg total) by mouth daily. 01/16/13   Larey Dresser, MD  HYDROcodone-acetaminophen (NORCO/VICODIN) 5-325 MG per tablet Take 1 tablet by mouth every 4 (four) hours as needed. 11/01/13   Dorie Rank, MD  levothyroxine (SYNTHROID, LEVOTHROID) 100 MCG tablet Take 100 mcg by mouth daily.      Historical Provider, MD  metoprolol succinate (TOPROL-XL) 25 MG 24 hr tablet Take 1 tablet (25 mg total) by mouth daily. 05/10/12   Aleksei Plotnikov V, MD  omeprazole (PRILOSEC) 20 MG capsule Take 40 mg by mouth daily.      Historical Provider, MD  pravastatin (PRAVACHOL) 20 MG tablet Take 1 tablet (20 mg total) by  mouth every evening. 01/16/13   Larey Dresser, MD  tiZANidine (ZANAFLEX) 4 MG tablet Take 0.5-1 tablets (2-4 mg total) by mouth every 8 (eight) hours as needed (cramps). 01/09/13   Aleksei Plotnikov V, MD   BP 148/76  Pulse 88  Temp(Src) 97.7 F (36.5 C) (Oral)  Resp 18  SpO2 97% Physical Exam  Pulmonary/Chest: He exhibits tenderness.  tenderness to palpation left posterior chest wall    ED Course  Procedures (including critical care time) DIAGNOSTIC STUDIES: Oxygen Saturation is 972% on room air, normal by my interpretation.    COORDINATION OF CARE: 3:16 PM-Discussed treatment plan which includes pain medication with pt at bedside and pt agreed to plan.   Labs Review Labs  Reviewed  BASIC METABOLIC PANEL - Abnormal; Notable for the following:    BUN 26 (*)    Creatinine, Ser 1.80 (*)    GFR calc non Af Amer 33 (*)    GFR calc Af Amer 38 (*)    All other components within normal limits  CBC WITH DIFFERENTIAL   Imaging Review Dg Ribs Unilateral W/chest Left  11/01/2013   CLINICAL DATA:  Productive cough.  Rib pain.  EXAM: LEFT RIBS AND CHEST - 3+ VIEW  COMPARISON:  None.  FINDINGS: Mediastinum and hilar structures normal. Heart size normal. Prior CABG. No pleural effusion or pneumothorax. Nondisplaced posterior lateral left tenth rib fracture. Ankylosis of the lumbar spine suggesting ankylosing spondylitis.  IMPRESSION: 1. Nondisplaced posterior lateral left tenth rib fracture. 2. Probable ankylosing spondylitis. 3. Prior CABG.   Electronically Signed   By: Marcello Moores  Register   On: 11/01/2013 14:28     MDM   Final diagnoses:  Rib fracture, left, closed, initial encounter    Pt has had a recent cough.  Rib fracture noted on xray.  Labs unremarkable. No pna and cough has been improving.  Will dc home with pain meds.  Follow up with PCP.  I personally performed the services described in this documentation, which was scribed in my presence.  The recorded information has been reviewed and is accurate.      Dorie Rank, MD 11/01/13 [phone]

## 2013-11-19 ENCOUNTER — Encounter: Payer: Self-pay | Admitting: Internal Medicine

## 2013-11-19 ENCOUNTER — Ambulatory Visit (INDEPENDENT_AMBULATORY_CARE_PROVIDER_SITE_OTHER): Payer: Medicare Other | Admitting: Internal Medicine

## 2013-11-19 VITALS — BP 142/86 | HR 69 | Temp 97.8°F | Wt 198.2 lb

## 2013-11-19 DIAGNOSIS — H6982 Other specified disorders of Eustachian tube, left ear: Secondary | ICD-10-CM

## 2013-11-19 DIAGNOSIS — H6590 Unspecified nonsuppurative otitis media, unspecified ear: Secondary | ICD-10-CM

## 2013-11-19 DIAGNOSIS — H6980 Other specified disorders of Eustachian tube, unspecified ear: Secondary | ICD-10-CM

## 2013-11-19 MED ORDER — NEOMYCIN-POLYMYXIN-HC 3.5-10000-1 OT SUSP: 3.0000 [drp] | Freq: Four times a day (QID) | OTIC | Status: DC

## 2013-11-19 NOTE — Patient Instructions (Signed)
Go to [facility] MD for eustachian tube dysfunction. Drink thin  fluids liberally through the day and chew sugarless gum . Do the Valsalva maneuver several times a day to "pop" ears open. Flonase 1 spray in each nostril twice a day as needed. Use the "crossover" technique as discussed. Use a Neti pot daily- 2x /day  as needed for sinus congestion  

## 2013-11-19 NOTE — Progress Notes (Signed)
   Subjective:    Patient ID: Allen Holloway, male    DOB: 25-Mar-1927, 80 y.o.   MRN: [id number]  HPI  He had a significant respiratory tract infection involving the chest and upper airway 2 weeks ago. Apparently he sustained a rib fracture with cough. Those symptoms have resolved but now he has discomfort in the left ear with decreased hearing. He states it is popping in his ears and his feels as if  he is "in a barrel".  He had used Q-tips to remove wax.  He also took Mucinex and Alka-Seltzer plus with some benefit  His cough is now productive of clear sputum. He also has sneezing rarely.    Review of Systems  He denies frontal headache, facial pain, nasal purulence, wheezing, shortness of breath.  He  has no fever, chills, or sweats  Itchy, watery eyes and sneezing are not significant.     Objective:   Physical Exam   Positive or significant findings include: He appears healthy and well-nourished and much younger than his stated age. The external nose is pock marked & pitted. There is hypertrophy and deviation of the right nasal septum The left tympanic membrane is distorted and erythematous. There is absent light reflex. There are scattered specks of dried blood. There is minimal wax in the left otic canal. He does have marked decrease in hearing bilaterally. He has an upper dental plate &  lower partial.  General appearance:good health ;well nourished; no acute distress or increased work of breathing is present.  No  lymphadenopathy about the head, neck, or axilla noted. Appears younger than stated age Eyes: No conjunctival inflammation or lid edema is present. There is no scleral icterus. Oral exam:  lips and gums are healthy appearing.There is no oropharyngeal erythema or exudate noted.  Neck:  No deformities, thyromegaly, masses, or tenderness noted.   Some vdecreased range of motion without pain.  Heart:  Normal rate and regular rhythm. S1 and S2 normal without  gallop, murmur, click, rub or other extra sounds. Lungs:Chest clear to auscultation; no wheezes, rhonchi,rales ,or rubs present.No increased work of breathing.  BS slightly decresased Extremities:  No cyanosis, edema, or clubbing  noted  Skin: Warm & dry w/o jaundice or tenting.         Assessment & Plan:  #1 eustachian tube dysfunction; possible residual L otitis media  Plan: as per protocol in AVS.  If there is not resolution of symptoms with the ear drops and the nasal steroid; ENT referral recommended.

## 2013-11-19 NOTE — Progress Notes (Signed)
   Subjective:    Patient ID: Allen Holloway, male    DOB: November 23, 1926, 80 y.o.   MRN: [id number]  HPI Patient has had a change in hearing out of left ear for 2 weeks, which happened after a recent cold. He is not having those cold symptoms any longer. He feels he sounds like he is in a barrel, his ears are popping with valsalva maneuver, and he does use Q-tips to clean ears.   He is currently having mild blurry vision that is consistent and is in the right eye more than the left. He also states he has left hip pain, but he will be seen next week in [place] at the [facility] for evaluation and treatment.  Review of Systems Positive for decrease in hearing L>R, joint pain in Left hip/buttock, blurry vision R>L.   Denies cough, sinus pain, pain in teeth, nasal secretions discharge from ears, earache, fever, chills, sweats, itchy/watery eyes, sneezing.     Objective:   Physical Exam  Left TM dull, dried blood, erythematous Right TM normal Whisper test failed bilaterally     Assessment & Plan:

## 2013-11-19 NOTE — Progress Notes (Signed)
Pre visit review using our clinic review tool, if applicable. No additional management support is needed unless otherwise documented below in the visit note. 

## 2013-12-31 ENCOUNTER — Ambulatory Visit (INDEPENDENT_AMBULATORY_CARE_PROVIDER_SITE_OTHER): Payer: Medicare Other | Admitting: Internal Medicine

## 2013-12-31 ENCOUNTER — Encounter: Payer: Self-pay | Admitting: Internal Medicine

## 2013-12-31 ENCOUNTER — Other Ambulatory Visit (INDEPENDENT_AMBULATORY_CARE_PROVIDER_SITE_OTHER): Payer: Medicare Other

## 2013-12-31 VITALS — BP 148/80 | HR 80 | Temp 98.0°F | Resp 16 | Ht 68.0 in | Wt 202.0 lb

## 2013-12-31 DIAGNOSIS — Z0000 Encounter for general adult medical examination without abnormal findings: Secondary | ICD-10-CM

## 2013-12-31 DIAGNOSIS — I2510 Atherosclerotic heart disease of native coronary artery without angina pectoris: Secondary | ICD-10-CM

## 2013-12-31 DIAGNOSIS — S2239XA Fracture of one rib, unspecified side, initial encounter for closed fracture: Secondary | ICD-10-CM | POA: Insufficient documentation

## 2013-12-31 DIAGNOSIS — N320 Bladder-neck obstruction: Secondary | ICD-10-CM

## 2013-12-31 DIAGNOSIS — E785 Hyperlipidemia, unspecified: Secondary | ICD-10-CM

## 2013-12-31 DIAGNOSIS — IMO0001 Reserved for inherently not codable concepts without codable children: Secondary | ICD-10-CM

## 2013-12-31 DIAGNOSIS — Z23 Encounter for immunization: Secondary | ICD-10-CM

## 2013-12-31 DIAGNOSIS — M1990 Unspecified osteoarthritis, unspecified site: Secondary | ICD-10-CM

## 2013-12-31 DIAGNOSIS — N259 Disorder resulting from impaired renal tubular function, unspecified: Secondary | ICD-10-CM

## 2013-12-31 DIAGNOSIS — I10 Essential (primary) hypertension: Secondary | ICD-10-CM

## 2013-12-31 DIAGNOSIS — S2231XD Fracture of one rib, right side, subsequent encounter for fracture with routine healing: Secondary | ICD-10-CM

## 2013-12-31 LAB — CBC WITH DIFFERENTIAL/PLATELET
BASOS PCT: 0.2 % (ref 0.0–3.0)
Basophils Absolute: 0 10*3/uL (ref 0.0–0.1)
EOS PCT: 2.4 % (ref 0.0–5.0)
Eosinophils Absolute: 0.2 10*3/uL (ref 0.0–0.7)
HCT: 41 % (ref 39.0–52.0)
Hemoglobin: 13.5 g/dL (ref 13.0–17.0)
LYMPHS PCT: 35.6 % (ref 12.0–46.0)
Lymphs Abs: 2.4 10*3/uL (ref 0.7–4.0)
MCHC: 32.9 g/dL (ref 30.0–36.0)
MCV: 89.5 fl (ref 78.0–100.0)
Monocytes Absolute: 0.5 10*3/uL (ref 0.1–1.0)
Monocytes Relative: 8 % (ref 3.0–12.0)
NEUTROS PCT: 53.8 % (ref 43.0–77.0)
Neutro Abs: 3.6 10*3/uL (ref 1.4–7.7)
Platelets: 189 10*3/uL (ref 150.0–400.0)
RBC: 4.58 Mil/uL (ref 4.22–5.81)
RDW: 15.8 % — ABNORMAL HIGH (ref 11.5–15.5)
WBC: 6.8 10*3/uL (ref 4.0–10.5)

## 2013-12-31 LAB — BASIC METABOLIC PANEL
BUN: 38 mg/dL — ABNORMAL HIGH (ref 6–23)
CALCIUM: 9.7 mg/dL (ref 8.4–10.5)
CHLORIDE: 105 meq/L (ref 96–112)
CO2: 28 mEq/L (ref 19–32)
CREATININE: 1.8 mg/dL — AB (ref 0.4–1.5)
GFR: 38.02 mL/min — ABNORMAL LOW (ref >60.00)
Glucose, Bld: 112 mg/dL — ABNORMAL HIGH (ref 70–99)
Potassium: 5.2 mEq/L — ABNORMAL HIGH (ref 3.5–5.1)
Sodium: 140 mEq/L (ref 135–145)

## 2013-12-31 LAB — LIPID PANEL
Cholesterol: 203 mg/dL — ABNORMAL HIGH (ref 0–200)
HDL: 47.6 mg/dL (ref >39.00)
LDL Cholesterol: 138 mg/dL — ABNORMAL HIGH (ref 0–99)
NONHDL: 155.4
Total CHOL/HDL Ratio: 4
Triglycerides: 89 mg/dL (ref 0.0–149.0)
VLDL: 17.8 mg/dL (ref 0.0–40.0)

## 2013-12-31 LAB — URINALYSIS
BILIRUBIN URINE: NEGATIVE
Hgb urine dipstick: NEGATIVE
KETONES UR: NEGATIVE
Leukocytes, UA: NEGATIVE
NITRITE: NEGATIVE
PH: 5.5 (ref 5.0–8.0)
Specific Gravity, Urine: 1.02 (ref 1.000–1.030)
TOTAL PROTEIN, URINE-UPE24: NEGATIVE
Urine Glucose: NEGATIVE
Urobilinogen, UA: 0.2 (ref 0.0–1.0)

## 2013-12-31 LAB — HEPATIC FUNCTION PANEL
ALK PHOS: 80 U/L (ref 39–117)
ALT: 16 U/L (ref 0–53)
AST: 24 U/L (ref 0–37)
Albumin: 3.7 g/dL (ref 3.5–5.2)
BILIRUBIN DIRECT: 0.1 mg/dL (ref 0.0–0.3)
TOTAL PROTEIN: 6.6 g/dL (ref 6.0–8.3)
Total Bilirubin: 0.7 mg/dL (ref 0.2–1.2)

## 2013-12-31 LAB — PSA: PSA: 0.76 ng/mL (ref 0.10–4.00)

## 2013-12-31 LAB — TSH: TSH: 1.04 u[IU]/mL (ref 0.35–4.50)

## 2013-12-31 NOTE — Assessment & Plan Note (Signed)
At goal - cont rx

## 2013-12-31 NOTE — Progress Notes (Signed)
Pre visit review using our clinic review tool, if applicable. No additional management support is needed unless otherwise documented below in the visit note. 

## 2013-12-31 NOTE — Assessment & Plan Note (Signed)
Continue with current prescription therapy as reflected on the Med list.  

## 2013-12-31 NOTE — Progress Notes (Signed)
   Subjective:    HPI The patient is here for a wellness exam.  F/u R 10th rib fx - better   The patient presents for a follow-up of  chronic hypertension, chronic dyslipidemia, type 2 pre diabetes controlled with medicines/diet, CRI. [place] doctor switched him from Crestor to Lipitor due to leg pain. He had labs there recently BP.   BP Readings from Last 3 Encounters:  12/31/13 148/80  11/19/13 142/86  11/01/13 166/77   Wt Readings from Last 3 Encounters:  12/31/13 202 lb (91.627 kg)  11/19/13 198 lb 3.2 oz (89.903 kg)  07/29/13 208 lb (94.348 kg)    Review of Systems  Constitutional: Negative for appetite change, fatigue and unexpected weight change.  HENT: Negative for congestion, nosebleeds, sneezing, sore throat and trouble swallowing.   Eyes: Negative for itching and visual disturbance.  Respiratory: Negative for cough.   Cardiovascular: Positive for leg swelling. Negative for chest pain and palpitations.  Gastrointestinal: Negative for nausea, diarrhea, blood in stool and abdominal distention.  Genitourinary: Negative for frequency and hematuria.  Musculoskeletal: Positive for arthralgias and gait problem. Negative for back pain, joint swelling and neck pain.  Skin: Negative for rash.  Neurological: Negative for dizziness, tremors, speech difficulty and weakness.  Psychiatric/Behavioral: Negative for sleep disturbance, dysphoric mood and agitation. The patient is not nervous/anxious.        Objective:   Physical Exam  Constitutional: He is oriented to person, place, and time. He appears well-developed.  HENT:  Mouth/Throat: Oropharynx is clear and moist.  Eyes: Conjunctivae are normal. Pupils are equal, round, and reactive to light.  Neck: Normal range of motion. No JVD present. No thyromegaly present.  Cardiovascular: Normal rate, regular rhythm, normal heart sounds and intact distal pulses.  Exam reveals no gallop and no friction rub.   No murmur  heard. Pulmonary/Chest: Effort normal and breath sounds normal. No respiratory distress. He has no wheezes. He has no rales. He exhibits no tenderness.  Abdominal: Soft. Bowel sounds are normal. He exhibits no distension and no mass. There is no tenderness. There is no rebound and no guarding.  Genitourinary:  declined  Musculoskeletal: Normal range of motion. He exhibits edema. He exhibits no tenderness.  Lymphadenopathy:    He has no cervical adenopathy.  Neurological: He is alert and oriented to person, place, and time. He has normal reflexes. No cranial nerve deficit. He exhibits normal muscle tone. Coordination normal.  Skin: Skin is warm and dry. No rash noted.  Psychiatric: He has a normal mood and affect. His behavior is normal. Judgment and thought content normal.  cracked heels, fingers B LE no edema A cane  Lab Results  Component Value Date   WBC 9.9 11/01/2013   HGB 13.4 11/01/2013   HCT 41.9 11/01/2013   PLT 280 11/01/2013   GLUCOSE 96 11/01/2013   CHOL 147 03/17/2013   TRIG 89.0 03/17/2013   HDL 42.80 03/17/2013   LDLCALC 86 03/17/2013   ALT 19 03/17/2013   AST 25 03/17/2013   NA 141 11/01/2013   K 4.2 11/01/2013   CL 100 11/01/2013   CREATININE 1.80* 11/01/2013   BUN 26* 11/01/2013   CO2 28 11/01/2013   TSH 0.16* 09/11/2012   PSA 0.68 10/09/2012   HGBA1C 6.2 01/01/2012       Assessment & Plan:

## 2013-12-31 NOTE — Assessment & Plan Note (Signed)
Doing better.   

## 2013-12-31 NOTE — Assessment & Plan Note (Signed)
Continue with current prn Tylenol as reflected on the Med list.

## 2013-12-31 NOTE — Patient Instructions (Signed)
Dr Clarisa Fling [phone]   Health Maintenance A healthy lifestyle and preventative care can promote health and wellness.  Maintain regular health, dental, and eye exams.  Eat a healthy diet. Foods like vegetables, fruits, whole grains, low-fat dairy products, and lean protein foods contain the nutrients you need and are low in calories. Decrease your intake of foods high in solid fats, added sugars, and salt. Get information about a proper diet from your health care provider, if necessary.  Regular physical exercise is one of the most important things you can do for your health. Most adults should get at least 150 minutes of moderate-intensity exercise (any activity that increases your heart rate and causes you to sweat) each week. In addition, most adults need muscle-strengthening exercises on 2 or more days a week.   Maintain a healthy weight. The body mass index (BMI) is a screening tool to identify possible weight problems. It provides an estimate of body fat based on height and weight. Your health care provider can find your BMI and can help you achieve or maintain a healthy weight. For males 20 years and older:  A BMI below 18.5 is considered underweight.  A BMI of 18.5 to 24.9 is normal.  A BMI of 25 to 29.9 is considered overweight.  A BMI of 30 and above is considered obese.  Maintain normal blood lipids and cholesterol by exercising and minimizing your intake of saturated fat. Eat a balanced diet with plenty of fruits and vegetables. Blood tests for lipids and cholesterol should begin at age 17 and be repeated every 5 years. If your lipid or cholesterol levels are high, you are over age 57, or you are at high risk for heart disease, you may need your cholesterol levels checked more frequently.Ongoing high lipid and cholesterol levels should be treated with medicines if diet and exercise are not working.  If you smoke, find out from your health care provider how to quit. If you  do not use tobacco, do not start.  Lung cancer screening is recommended for adults aged 46-80 years who are at high risk for developing lung cancer because of a history of smoking. A yearly low-dose CT scan of the lungs is recommended for people who have at least a 30-pack-year history of smoking and are current smokers or have quit within the past 15 years. A pack year of smoking is smoking an average of 1 pack of cigarettes a day for 1 year (for example, a 30-pack-year history of smoking could mean smoking 1 pack a day for 30 years or 2 packs a day for 15 years). Yearly screening should continue until the smoker has stopped smoking for at least 15 years. Yearly screening should be stopped for people who develop a health problem that would prevent them from having lung cancer treatment.  If you choose to drink alcohol, do not have more than 2 drinks per day. One drink is considered to be 12 oz (360 mL) of beer, 5 oz (150 mL) of wine, or 1.5 oz (45 mL) of liquor.  Avoid the use of street drugs. Do not share needles with anyone. Ask for help if you need support or instructions about stopping the use of drugs.  High blood pressure causes heart disease and increases the risk of stroke. Blood pressure should be checked at least every 1-2 years. Ongoing high blood pressure should be treated with medicines if weight loss and exercise are not effective.  If you are 39-46 years old,  ask your health care provider if you should take aspirin to prevent heart disease.  Diabetes screening involves taking a blood sample to check your fasting blood sugar level. This should be done once every 3 years after age 45 if you are at a normal weight and without risk factors for diabetes. Testing should be considered at a younger age or be carried out more frequently if you are overweight and have at least 1 risk factor for diabetes.  Colorectal cancer can be detected and often prevented. Most routine colorectal cancer  screening begins at the age of 50 and continues through age 75. However, your health care provider may recommend screening at an earlier age if you have risk factors for colon cancer. On a yearly basis, your health care provider may provide home test kits to check for hidden blood in the stool. A small camera at the end of a tube may be used to directly examine the colon (sigmoidoscopy or colonoscopy) to detect the earliest forms of colorectal cancer. Talk to your health care provider about this at age 50 when routine screening begins. A direct exam of the colon should be repeated every 5-10 years through age 75, unless early forms of precancerous polyps or small growths are found.  People who are at an increased risk for hepatitis B should be screened for this virus. You are considered at high risk for hepatitis B if:  You were born in a country where hepatitis B occurs often. Talk with your health care provider about which countries are considered high risk.  Your parents were born in a high-risk country and you have not received a shot to protect against hepatitis B (hepatitis B vaccine).  You have HIV or AIDS.  You use needles to inject street drugs.  You live with, or have sex with, someone who has hepatitis B.  You are a man who has sex with other men (MSM).  You get hemodialysis treatment.  You take certain medicines for conditions like cancer, organ transplantation, and autoimmune conditions.  Hepatitis C blood testing is recommended for all people born from 1945 through 1965 and any individual with known risk factors for hepatitis C.  Healthy men should no longer receive prostate-specific antigen (PSA) blood tests as part of routine cancer screening. Talk to your health care provider about prostate cancer screening.  Testicular cancer screening is not recommended for adolescents or adult males who have no symptoms. Screening includes self-exam, a health care provider exam, and other  screening tests. Consult with your health care provider about any symptoms you have or any concerns you have about testicular cancer.  Practice safe sex. Use condoms and avoid high-risk sexual practices to reduce the spread of sexually transmitted infections (STIs).  You should be screened for STIs, including gonorrhea and chlamydia if:  You are sexually active and are younger than 24 years.  You are older than 24 years, and your health care provider tells you that you are at risk for this type of infection.  Your sexual activity has changed since you were last screened, and you are at an increased risk for chlamydia or gonorrhea. Ask your health care provider if you are at risk.  If you are at risk of being infected with HIV, it is recommended that you take a prescription medicine daily to prevent HIV infection. This is called pre-exposure prophylaxis (PrEP). You are considered at risk if:  You are a man who has sex with other men (MSM).    You are a heterosexual man who is sexually active with multiple partners.  You take drugs by injection.  You are sexually active with a partner who has HIV.  Talk with your health care provider about whether you are at high risk of being infected with HIV. If you choose to begin PrEP, you should first be tested for HIV. You should then be tested every 3 months for as long as you are taking PrEP.  Use sunscreen. Apply sunscreen liberally and repeatedly throughout the day. You should seek shade when your shadow is shorter than you. Protect yourself by wearing long sleeves, pants, a wide-brimmed hat, and sunglasses year round whenever you are outdoors.  Tell your health care provider of new moles or changes in moles, especially if there is a change in shape or color. Also, tell your health care provider if a mole is larger than the size of a pencil eraser.  A one-time screening for abdominal aortic aneurysm (AAA) and surgical repair of large AAAs by  ultrasound is recommended for men aged 19-75 years who are current or former smokers.  Stay current with your vaccines (immunizations). Document Released: 10/07/2007 Document Revised: 04/15/2013 Document Reviewed: 09/05/2010 [facility] Patient Information 2015 [place], [place]. This information is not intended to replace advice given to you by your health care provider. Make sure you discuss any questions you have with your health care provider.

## 2013-12-31 NOTE — Assessment & Plan Note (Addendum)
Here for medicare wellness/physical  Diet: heart healthy  Physical activity: not sedentary  Depression/mood screen: negative  Hearing: intact to whispered voice  Visual acuity: grossly normal, performs annual eye exam  ADLs: capable  Fall risk: low w/cane Home safety: good  Cognitive evaluation: intact to orientation, naming, recall and repetition  EOL planning: adv directives, full code/ I agree  I have personally reviewed and have noted  1. The patient's medical and social history  2. Their use of alcohol, tobacco or illicit drugs  3. Their current medications and supplements  4. The patient's functional ability including ADL's, fall risks, home safety risks and hearing or visual impairment.  5. Diet and physical activities  6. Evidence for depression or mood disorders    Today patient counseled on age appropriate routine health concerns for screening and prevention, each reviewed and up to date or declined. Immunizations reviewed and up to date or declined. Labs ordered and reviewed. Risk factors for depression reviewed and negative. Hearing function and visual acuity are intact. ADLs screened and addressed as needed. Functional ability and level of safety reviewed and appropriate. Education, counseling and referrals performed based on assessed risks today. Patient provided with a copy of personalized plan for preventive services.  Flu shot

## 2013-12-31 NOTE — Assessment & Plan Note (Signed)
Labs

## 2014-01-01 ENCOUNTER — Other Ambulatory Visit: Payer: Self-pay | Admitting: Internal Medicine

## 2014-01-03 ENCOUNTER — Other Ambulatory Visit: Payer: Self-pay | Admitting: Internal Medicine

## 2014-01-22 ENCOUNTER — Ambulatory Visit: Payer: Medicare Other | Admitting: Family Medicine

## 2014-01-23 ENCOUNTER — Encounter: Payer: Self-pay | Admitting: Family Medicine

## 2014-01-23 ENCOUNTER — Ambulatory Visit (INDEPENDENT_AMBULATORY_CARE_PROVIDER_SITE_OTHER): Payer: Medicare Other | Admitting: Family Medicine

## 2014-01-23 VITALS — BP 149/74 | HR 57 | Temp 97.6°F | Resp 18 | Ht 68.5 in | Wt 200.0 lb

## 2014-01-23 DIAGNOSIS — L97919 Non-pressure chronic ulcer of unspecified part of right lower leg with unspecified severity: Secondary | ICD-10-CM | POA: Insufficient documentation

## 2014-01-23 DIAGNOSIS — L97911 Non-pressure chronic ulcer of unspecified part of right lower leg limited to breakdown of skin: Secondary | ICD-10-CM

## 2014-01-23 DIAGNOSIS — I872 Venous insufficiency (chronic) (peripheral): Secondary | ICD-10-CM | POA: Insufficient documentation

## 2014-01-23 NOTE — Progress Notes (Signed)
OFFICE NOTE  01/23/2014  CC:  Chief Complaint  Patient presents with  . Leg Problem    Right sided    HPI: Patient is a 80 y.o. Caucasian male who is here for lower leg weeping skin. Switching care from Dr. Alain Marion to me b/c of proximity of my office to his home in [place]. He goes to the [facility] [place] annually as well.  He presents for 2d of right lower leg superficial ulceration that is weeping clear fluid.  A couple days prior to noting this, he had dropped an old metal car door and it knocked against this location where he now notes the skin ulceration. Prior to the ulcer/weeping, he did not note any acute worsening of his chronic LE edema, which he takes 20mg  lasix daily for.  He did take an extra 20mg  lasix pill a couple of days ago and feels like it helped a little. Denies any pain at the area, no malaise or fevers.    Pertinent PMH:  PMH and PSH reviewed. (stage 3 CRI, CrCl @40  ml/min, ECHO with normal systolic function in summer 2014). MEDS:  Outpatient Prescriptions Prior to Visit  Medication Sig Dispense Refill  . aspirin 325 MG tablet Take 325 mg by mouth daily. TAKE 1/2 TAB      . atorvastatin (LIPITOR) 20 MG tablet Take 1 tablet (20 mg total) by mouth daily.  90 tablet  3  . Cholecalciferol (VITAMIN D3) 1000 UNITS tablet Take 1,000 Units by mouth daily.        . Coenzyme Q10 200 MG TABS Take by mouth.    0  . furosemide (LASIX) 20 MG tablet Take 1-2 tablets (20-40 mg total) by mouth daily.  90 tablet  3  . levothyroxine (SYNTHROID, LEVOTHROID) 100 MCG tablet Take 100 mcg by mouth daily.        . metoprolol succinate (TOPROL-XL) 25 MG 24 hr tablet Take 1 tablet (25 mg total) by mouth daily.  90 tablet  3  . neomycin-polymyxin-hydrocortisone (CORTISPORIN) 3.5-10000-1 otic suspension PLACE 3 DROPS INTO THE LEFT EAR FOUR TIMES DAILY  10 mL  0  . omeprazole (PRILOSEC) 20 MG capsule Take 40 mg by mouth daily.        Marland Kitchen tiZANidine (ZANAFLEX) 4 MG tablet Take 0.5-1 tablets (2-4 mg  total) by mouth every 8 (eight) hours as needed (cramps).  60 tablet  1   No facility-administered medications prior to visit.    PE: Blood pressure 149/74, pulse 57, temperature 97.6 F (36.4 C), temperature source Temporal, resp. rate 18, height 5' 8.5" (1.74 m), weight 200 lb (90.719 kg), SpO2 98.00%. Gen: Alert, well appearing.  Patient is oriented to person, place, time, and situation. AFFECT: pleasant, lucid thought and speech. LEGS: both LL's with 2+ pitting edema from knees down, with left ankle being puffier than all other areas (chronic per patient and wife).  Mild freckling skin changes in anterior LL skin surface.  Right upper ankle region anteriorly has a 1.8 cm irregularly shaped/oval shaped superficial ulceration with gentle drainage of clear fluid.  No foul odor, no erythema in wound or around it.  No bleeding, no bruising.   L calf circ 5 inches below inf border of patella is 15 3/8 inches R calf circ 5 inches below inf border of patella is 15 7/8 inches  IMPRESSION AND PLAN:  New pt; establishing care due to proximity of our office to his home.  Right lower anterior tibial traumatic superficial skin ulceration, weeping  present b/c he has chronic LE venous insufficiency edema.  His chronic edema is at baseline, but we'll try to maximize an environment for healing of the ulceration--go ahead and double up on lasix again today but then resume 20mg  qd dosing.   Further instructions: Cover with clean gauze/bandaid and apply neosporin once a day. Eat a low sodium diet. Elevate legs above the level of your heart for 20 minutes a day. Today, take an extra 20mg  lasix tab.  Signs/symptoms to call or return for were reviewed and pt expressed understanding.  An After Visit Summary was printed and given to the patient.  FOLLOW UP: 6-7d

## 2014-01-23 NOTE — Patient Instructions (Signed)
Cover with clean gauze/bandaid and apply neosporin once a day. Eat a low sodium diet. Elevate legs above the level of your heart for 20 minutes a day. Today, take an extra 20mg  lasix tab.

## 2014-01-23 NOTE — Progress Notes (Signed)
Pre visit review using our clinic review tool, if applicable. No additional management support is needed unless otherwise documented below in the visit note. 

## 2014-01-30 ENCOUNTER — Encounter: Payer: Self-pay | Admitting: Family Medicine

## 2014-01-30 ENCOUNTER — Ambulatory Visit (INDEPENDENT_AMBULATORY_CARE_PROVIDER_SITE_OTHER): Payer: Medicare Other | Admitting: Family Medicine

## 2014-01-30 VITALS — BP 129/65 | HR 47 | Temp 97.8°F | Resp 18 | Ht 68.5 in | Wt 201.0 lb

## 2014-01-30 DIAGNOSIS — I872 Venous insufficiency (chronic) (peripheral): Secondary | ICD-10-CM

## 2014-01-30 DIAGNOSIS — L97911 Non-pressure chronic ulcer of unspecified part of right lower leg limited to breakdown of skin: Secondary | ICD-10-CM

## 2014-01-30 NOTE — Progress Notes (Signed)
Pre visit review using our clinic review tool, if applicable. No additional management support is needed unless otherwise documented below in the visit note. 

## 2014-01-30 NOTE — Progress Notes (Signed)
OFFICE NOTE  01/30/2014  CC:  Chief Complaint  Patient presents with  . Follow-up     HPI: Patient is a 80 y.o. Caucasian male who is here for 1 wk f/u leg edema with weeping fluid. His small wound on right LL is sealed up, no more weeping fluid.  No pain or redness.  He feels well.   Pertinent PMH:  Past medical, surgical, social, and family history reviewed and no changes are noted since last office visit.  MEDS:  Outpatient Prescriptions Prior to Visit  Medication Sig Dispense Refill  . aspirin 325 MG tablet Take 325 mg by mouth daily. TAKE 1/2 TAB      . atorvastatin (LIPITOR) 20 MG tablet Take 1 tablet (20 mg total) by mouth daily.  90 tablet  3  . Cholecalciferol (VITAMIN D3) 1000 UNITS tablet Take 1,000 Units by mouth daily.        . Coenzyme Q10 200 MG TABS Take by mouth.    0  . furosemide (LASIX) 20 MG tablet Take 1-2 tablets (20-40 mg total) by mouth daily.  90 tablet  3  . levothyroxine (SYNTHROID, LEVOTHROID) 100 MCG tablet Take 100 mcg by mouth daily.        . metoprolol succinate (TOPROL-XL) 25 MG 24 hr tablet Take 1 tablet (25 mg total) by mouth daily.  90 tablet  3  . neomycin-polymyxin-hydrocortisone (CORTISPORIN) 3.5-10000-1 otic suspension PLACE 3 DROPS INTO THE LEFT EAR FOUR TIMES DAILY  10 mL  0  . omeprazole (PRILOSEC) 20 MG capsule Take 40 mg by mouth daily.        Marland Kitchen tiZANidine (ZANAFLEX) 4 MG tablet Take 0.5-1 tablets (2-4 mg total) by mouth every 8 (eight) hours as needed (cramps).  60 tablet  1   No facility-administered medications prior to visit.    PE: Blood pressure 129/65, pulse 47, temperature 97.8 F (36.6 C), temperature source Temporal, resp. rate 18, height 5' 8.5" (1.74 m), weight 201 lb (91.173 kg), SpO2 97.00%. Gen: Alert, well appearing.  Patient is oriented to person, place, time, and situation. Regular, bradycardic.  Distant S1 and S2. LEGS: 2+ pitting edema in both LL's, with nickel sized pinkish, dry oval shaped macule of  superficial peeling skin on right ant tib surf.  No weeping from skin.  No tenderness or erythema or streaking or swelling or induration.  IMPRESSION AND PLAN:  LE edema, recent weeping superficial wound in RLL s/p dropping something on the area. This is healed up nicely, no sign of infection. Baseline level of LL edema bilat. No new recommendations at this time  FOLLOW UP: keep appt arranged with me for his first real routine f/u chronic illness visit with me since transferring from Dr. Lauretta Chester (05/2014)

## 2014-03-05 ENCOUNTER — Other Ambulatory Visit: Payer: Self-pay | Admitting: Cardiology

## 2014-04-10 ENCOUNTER — Other Ambulatory Visit (HOSPITAL_COMMUNITY): Payer: Self-pay | Admitting: *Deleted

## 2014-04-10 DIAGNOSIS — I6523 Occlusion and stenosis of bilateral carotid arteries: Secondary | ICD-10-CM

## 2014-04-29 ENCOUNTER — Ambulatory Visit (HOSPITAL_COMMUNITY): Payer: Medicare Other | Attending: Cardiovascular Disease | Admitting: *Deleted

## 2014-04-29 DIAGNOSIS — I6523 Occlusion and stenosis of bilateral carotid arteries: Secondary | ICD-10-CM | POA: Diagnosis not present

## 2014-04-29 NOTE — Progress Notes (Signed)
Carotid Duplex Exam Performed 

## 2014-04-30 ENCOUNTER — Ambulatory Visit (INDEPENDENT_AMBULATORY_CARE_PROVIDER_SITE_OTHER): Payer: Medicare Other | Admitting: Family Medicine

## 2014-04-30 ENCOUNTER — Encounter: Payer: Self-pay | Admitting: Family Medicine

## 2014-04-30 VITALS — BP 147/85 | HR 68 | Temp 98.6°F | Resp 18 | Ht 68.5 in | Wt 207.0 lb

## 2014-04-30 DIAGNOSIS — N183 Chronic kidney disease, stage 3 unspecified: Secondary | ICD-10-CM

## 2014-04-30 DIAGNOSIS — E039 Hypothyroidism, unspecified: Secondary | ICD-10-CM

## 2014-04-30 DIAGNOSIS — E785 Hyperlipidemia, unspecified: Secondary | ICD-10-CM

## 2014-04-30 DIAGNOSIS — E8881 Metabolic syndrome: Secondary | ICD-10-CM

## 2014-04-30 DIAGNOSIS — I872 Venous insufficiency (chronic) (peripheral): Secondary | ICD-10-CM

## 2014-04-30 LAB — COMPREHENSIVE METABOLIC PANEL
ALK PHOS: 88 U/L (ref 39–117)
ALT: 19 U/L (ref 0–53)
AST: 21 U/L (ref 0–37)
Albumin: 4.1 g/dL (ref 3.5–5.2)
BILIRUBIN TOTAL: 0.9 mg/dL (ref 0.2–1.2)
BUN: 35 mg/dL — AB (ref 6–23)
CHLORIDE: 104 meq/L (ref 96–112)
CO2: 28 mEq/L (ref 19–32)
Calcium: 9.9 mg/dL (ref 8.4–10.5)
Creatinine, Ser: 2 mg/dL — ABNORMAL HIGH (ref 0.4–1.5)
GFR: 34.05 mL/min — ABNORMAL LOW (ref >60.00)
Glucose, Bld: 116 mg/dL — ABNORMAL HIGH (ref 70–99)
Potassium: 5 mEq/L (ref 3.5–5.1)
Sodium: 139 mEq/L (ref 135–145)
Total Protein: 6.9 g/dL (ref 6.0–8.3)

## 2014-04-30 LAB — LIPID PANEL
CHOL/HDL RATIO: 5
CHOLESTEROL: 206 mg/dL — AB (ref 0–200)
HDL: 41.5 mg/dL (ref >39.00)
LDL Cholesterol: 143 mg/dL — ABNORMAL HIGH (ref 0–99)
NONHDL: 164.5
Triglycerides: 108 mg/dL (ref 0.0–149.0)
VLDL: 21.6 mg/dL (ref 0.0–40.0)

## 2014-04-30 LAB — HEMOGLOBIN A1C: Hgb A1c MFr Bld: 6.8 % — ABNORMAL HIGH (ref 4.6–6.5)

## 2014-04-30 NOTE — Addendum Note (Signed)
Addended by: Ralph Dowdy on: 04/30/2014 10:51 AM   Modules accepted: Medications

## 2014-04-30 NOTE — Progress Notes (Signed)
OFFICE VISIT  04/30/2014   CC:  Chief Complaint  Patient presents with  . Follow-up    fasting   HPI:    Patient is a 81 y.o. Caucasian male who presents for 3 mo f/u LE venous insufficiency edema. Essentially also here for his first real establish care/transfer care visit with me, formerly saw Dr. Alain Marion at [facility].  Saw Dr. Aundra Dubin yesterday and got carotid u/s to f/u mild dz per pt and this was unchanged compared to about 8 yrs ago. Plan for repeat every 1-2 yrs. Pt tries to walk daily on treadmill or in yard most days.  He is not limited by CP or SOB. He is limited by left hip arthritis.  Past Medical History  Diagnosis Date  . COPD (chronic obstructive pulmonary disease)   . Hyperlipidemia   . Hypertension   . Osteoarthritis   . GERD (gastroesophageal reflux disease)   . Hypothyroidism   . CAD (coronary artery disease)   . Carotid bruit     60-79% bilat In carot sten, stable over serial exams, most recent 04/2014--repeat 1 yr  . Cholelithiasis   . CKD (chronic kidney disease)   . Lumbago   . Diverticulosis   . Venous insufficiency of both lower extremities     Past Surgical History  Procedure Laterality Date  . Coronary artery bypass graft  2005    LIMA-LAD,SVG-PDA,SVG-D1,SVG-OM.  Myoview (9/08) showed EF63%,no scaror ischemia.  Marland Kitchen Appendectomy     MEDS: takes one 20mg  lasix daily Outpatient Prescriptions Prior to Visit  Medication Sig Dispense Refill  . aspirin 325 MG tablet Take 325 mg by mouth daily. TAKE 1/2 TAB    . atorvastatin (LIPITOR) 20 MG tablet Take 1 tablet (20 mg total) by mouth daily. 90 tablet 3  . Cholecalciferol (VITAMIN D3) 1000 UNITS tablet Take 1,000 Units by mouth daily.      . Coenzyme Q10 200 MG TABS Take by mouth.  0  . furosemide (LASIX) 20 MG tablet TAKE 1 TO 2 TABLETS BY MOUTH EVERY DAY 90 tablet 0  . levothyroxine (SYNTHROID, LEVOTHROID) 100 MCG tablet Take 100 mcg by mouth daily.      . metoprolol succinate (TOPROL-XL)  25 MG 24 hr tablet Take 1 tablet (25 mg total) by mouth daily. 90 tablet 3  . omeprazole (PRILOSEC) 20 MG capsule Take 40 mg by mouth daily.      Marland Kitchen tiZANidine (ZANAFLEX) 4 MG tablet Take 0.5-1 tablets (2-4 mg total) by mouth every 8 (eight) hours as needed (cramps). 60 tablet 1  . neomycin-polymyxin-hydrocortisone (CORTISPORIN) 3.5-10000-1 otic suspension PLACE 3 DROPS INTO THE LEFT EAR FOUR TIMES DAILY 10 mL 0   No facility-administered medications prior to visit.    Allergies  Allergen Reactions  . Amlodipine     edema  . Benazepril Hcl     REACTION: elev K    ROS As per HPI  PE: Blood pressure 147/85, pulse 68, temperature 98.6 F (37 C), temperature source Temporal, resp. rate 18, height 5' 8.5" (1.74 m), weight 207 lb (93.895 kg), SpO2 99 %. Gen: Alert, well appearing.  Patient is oriented to person, place, time, and situation. CV: RRR, no m/r/g.   LUNGS: CTA bilat, nonlabored resps, good aeration in all lung fields. EXT: 2+ bilat pretibial edema, without skin breakdown or weeping/leakage of fluid.   No erythema or rash or tenderness.  Feet without edema.  LABS:  None today RECENT: Lab Results  Component Value Date  CHOL 203* 12/31/2013   HDL 47.60 12/31/2013   LDLCALC 138* 12/31/2013   TRIG 89.0 12/31/2013   CHOLHDL 4 12/31/2013     Chemistry      Component Value Date/Time   NA 140 12/31/2013 0849   K 5.2* 12/31/2013 0849   CL 105 12/31/2013 0849   CO2 28 12/31/2013 0849   BUN 38* 12/31/2013 0849   CREATININE 1.8* 12/31/2013 0849      Component Value Date/Time   CALCIUM 9.7 12/31/2013 0849   ALKPHOS 80 12/31/2013 0849   AST 24 12/31/2013 0849   ALT 16 12/31/2013 0849   BILITOT 0.7 12/31/2013 0849     Lab Results  Component Value Date   WBC 6.8 12/31/2013   HGB 13.5 12/31/2013   HCT 41.0 12/31/2013   MCV 89.5 12/31/2013   PLT 189.0 12/31/2013   Lab Results  Component Value Date   TSH 1.04 12/31/2013   Lab Results  Component Value Date    HGBA1C 6.2 01/01/2012     IMPRESSION AND PLAN:  1) LE venous insufficiency: usual lifestyle management discussed.  Lasix 20mg  qd. Check BMET today.  2) Hyperlipidemia: needs FLP and hepatic panel today.  3) Hx of insulin resistance (A1c 6.2% in 2013): recheck A1c today.  4) CRI, most recent Cr in chart was 1.8 back in 12/31/13.  Will repeat BMET today.  An After Visit Summary was printed and given to the patient.  FOLLOW UP: Return in about 6 months (around 10/29/2014) for routine chronic illness f/u (fasting).

## 2014-04-30 NOTE — Progress Notes (Signed)
Pre visit review using our clinic review tool, if applicable. No additional management support is needed unless otherwise documented below in the visit note. 

## 2014-05-01 ENCOUNTER — Other Ambulatory Visit: Payer: Self-pay | Admitting: Family Medicine

## 2014-05-01 MED ORDER — ATORVASTATIN CALCIUM 40 MG PO TABS: 40.0000 mg | ORAL_TABLET | Freq: Every day | ORAL | Status: DC

## 2014-06-03 ENCOUNTER — Telehealth: Payer: Self-pay | Admitting: Family Medicine

## 2014-06-03 NOTE — Telephone Encounter (Signed)
[employer] Nurse called to let Dr. Anitra Lauth know that she visited Allen Holloway today and his pulse was 44. He is taking Metoperal and ? If adjustment was needed. Please contact pt. If any changes are needed.

## 2014-06-03 NOTE — Telephone Encounter (Signed)
Please advise 

## 2014-06-09 NOTE — Telephone Encounter (Signed)
Noted. Have seen pt in office with similar pulse and he had normal bp and was asymptomatic. No changes in meds recommended at this time.

## 2014-08-12 ENCOUNTER — Other Ambulatory Visit: Payer: Self-pay | Admitting: Cardiology

## 2014-10-21 ENCOUNTER — Other Ambulatory Visit: Payer: Self-pay | Admitting: Cardiology

## 2014-10-22 ENCOUNTER — Other Ambulatory Visit: Payer: Self-pay | Admitting: Cardiology

## 2014-11-24 ENCOUNTER — Other Ambulatory Visit: Payer: Self-pay | Admitting: Cardiology

## 2014-11-24 ENCOUNTER — Telehealth: Payer: Self-pay | Admitting: *Deleted

## 2014-11-24 NOTE — Telephone Encounter (Signed)
FYI: Pt came by office today requesting Dr. Anitra Lauth refill his Lasix's. I reviewed pts chart and it looks like pts cardiologist refused refill because pt is due of ov with them. I advised pt that he will need to contact his cardiologist for more refills since they are managing this medication.

## 2014-11-29 NOTE — Telephone Encounter (Signed)
He is overdue for routine f/u with me as well. Pls call him and let him know this, and see if he ever got the lasix RF'd by the cardiologist. If he didn't get it RF'd, go ahead and authorize a RF x 1 month supply and get him to make f/u appt with me.-thx

## 2014-11-30 NOTE — Telephone Encounter (Signed)
Left message for pt to call back  °

## 2014-12-01 NOTE — Telephone Encounter (Signed)
Pt advised and voiced understanding.  He stated that his cardiologist refilled the Lasixs, he stated that his wife is in the hospital right now so he will call back to schedule ov with Dr. Anitra Lauth.

## 2015-01-14 ENCOUNTER — Other Ambulatory Visit: Payer: Self-pay | Admitting: *Deleted

## 2015-01-14 MED ORDER — FUROSEMIDE 20 MG PO TABS: ORAL_TABLET | ORAL | Status: DC

## 2015-01-22 ENCOUNTER — Encounter: Payer: Self-pay | Admitting: *Deleted

## 2015-01-22 ENCOUNTER — Encounter: Payer: Self-pay | Admitting: Cardiology

## 2015-01-22 ENCOUNTER — Ambulatory Visit (INDEPENDENT_AMBULATORY_CARE_PROVIDER_SITE_OTHER): Payer: Medicare Other | Admitting: Cardiology

## 2015-01-22 VITALS — BP 126/82 | HR 58 | Ht 68.0 in | Wt 204.0 lb

## 2015-01-22 DIAGNOSIS — I6523 Occlusion and stenosis of bilateral carotid arteries: Secondary | ICD-10-CM | POA: Diagnosis not present

## 2015-01-22 DIAGNOSIS — I2510 Atherosclerotic heart disease of native coronary artery without angina pectoris: Secondary | ICD-10-CM

## 2015-01-22 DIAGNOSIS — I350 Nonrheumatic aortic (valve) stenosis: Secondary | ICD-10-CM

## 2015-01-22 DIAGNOSIS — I739 Peripheral vascular disease, unspecified: Secondary | ICD-10-CM

## 2015-01-22 MED ORDER — ATORVASTATIN CALCIUM 20 MG PO TABS: 20.0000 mg | ORAL_TABLET | Freq: Every day | ORAL | Status: DC

## 2015-01-22 NOTE — Patient Instructions (Signed)
Medication Instructions:  Decrease atorvastatin to 20 mg daily.  Stop metoprolol  Labwork: None today  Testing/Procedures: Your physician has requested that you have an echocardiogram. Echocardiography is a painless test that uses sound waves to create images of your heart. It provides your doctor with information about the size and shape of your heart and how well your heart's chambers and valves are working. This procedure takes approximately one hour. There are no restrictions for this procedure.  Your physician has requested that you have a carotid duplex. This test is an ultrasound of the carotid arteries in your neck. It looks at blood flow through these arteries that supply the brain with blood. Allow one hour for this exam. There are no restrictions or special instructions. January 2017    Follow-Up: Your physician wants you to follow-up in: 6 months with Dr Aundra Dubin. (March 2017). You will receive a reminder letter in the mail two months in advance. If you don't receive a letter, please call our office to schedule the follow-up appointment.   Any Other Special Instructions Will Be Listed Below (If Applicable).   You have been referred to the [facility] for evaluation for PCSK9 inhibitor-new cholesterol medication. You should expect a call from one of our pharmacists in a few days.

## 2015-01-24 NOTE — Progress Notes (Signed)
Patient ID: Allen Holloway, male   DOB: Sep 25, 1926, 81 y.o.   MRN: [id number] PCP: Dr. Alain Marion  81 yo with history of CAD s/p CABG in 2005 with nonischemic myoview in 2008 presents for cardiology evaluation. Last echo in 6/14 showed EF 50-55% with mild aortic stenosis.  He has had no chest pain.  No dyspnea walking on flat ground.  Mild dyspnea with steps.  No orthopnea/PND.  Type 1 2nd degree AV block on ECG today, this has been seen in the past.  He denies lightheadedness or syncope. Myalgias with Crestor, pravastatin, and atorvastatin 40.  He can tolerate atorvastatin 20.   Labs (6/11): K 5.3, creatinine 1.6, LDL 70, HDL 58, HCT 37, TSH normal  Labs (11/12): K 4.5, creatinine 1.9, LDL 77, HDL 60 Labs (5/14): BNP 296 Labs (9/14): K 4.5, creatinine 1.8 Labs (10/14): K 4.6, creatinine 1.8, BNP 203 Labs (11/14): LDL 86, HDL 43, K 4.8, creatinine 2.0 Labs (1/16): K 5, creatinine 2.0, LDL 143, HDL 41.5  ECG: NSR with type 1 2nd degree AV block, left axis deviation  Allergies (verified):  1) Lotensin (Benazepril Hcl)   Past Medical History:  1. COPD  2. Coronary artery disease: CABG 2005 with LIMA-LAD, SVG-PDA, SVG-D1, SVG-OM. Myoview (9/08) showed EF 63%, no scar or ischemia.  Echo (8/11): EF 55%, mild AI, mild MR, moderate TR, mildly dilated RV, PA systolic pressure 34 mmHg.  Echo (6/14): EF 50-55%, mild LVH, mild AS, mild MR, mildly dilated RV.  3. Hyperlipidemia: Myalgias Lipitor and Crestor.  4. Hypertension  5. Carotid bruit: Carotid dopplers (8/12) with [id number] RICA, [id number] LICA.  Carotid dopplers (4/14) with 60-79% bilateral ICA stenosis.  Carotid dopplers (10/14) with 60-79% bilateral ICA stenosis. Carotid dopplers (1/16) with 60-79% BICA stenosis.  6. Cholelithiasis  7. CKD  8. Low back pain  9. Osteoarthritis  10. GERD  11. Hypothyroidism  12. ABIs normal 8/11 and in 3/15.  13. 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 Chronic diastolic CHF 15. Aortic stenosis: Mild on 6/14 echo.    Family History:  Family History High cholesterol  Family History Hypertension   Social History:  Retired  Married  Former Smoker   ROS: All systems reviewed and negative except as per HPI.   Current Outpatient Prescriptions  Medication Sig Dispense Refill  . aspirin 325 MG tablet Take 325 mg by mouth daily. TAKE 1/2 TAB    . Cholecalciferol (VITAMIN D3) 1000 UNITS tablet Take 1,000 Units by mouth daily.      . Coenzyme Q10 200 MG TABS Take by mouth.  0  . FLUVIRIN SUSP ADM 0.5ML IM UTD  0  . furosemide (LASIX) 20 MG tablet Take 20 mg by mouth.    . levothyroxine (SYNTHROID, LEVOTHROID) 100 MCG tablet Take 100 mcg by mouth daily.      . meloxicam (MOBIC) 7.5 MG tablet Take 7.5 mg by mouth daily.    Marland Kitchen omeprazole (PRILOSEC) 20 MG capsule Take 40 mg by mouth daily.      Marland Kitchen atorvastatin (LIPITOR) 20 MG tablet Take 1 tablet (20 mg total) by mouth daily. 90 tablet 1   No current facility-administered medications for this visit.    BP 126/82 mmHg  Pulse 58  Ht 5\' 8"  (1.727 m)  Wt 204 lb (92.534 kg)  BMI 31.03 kg/m2 General: NAD Neck: No JVD, no thyromegaly or thyroid nodule.  Lungs: Clear to auscultation bilaterally with normal respiratory effort. CV: Nondisplaced PMI.  Heart regular S1/S2, no S3/S4,  2/6 early SEM RUSB.  Trace ankle edema.  Right carotid bruit. Difficult to palpate pedal pulses.    Abdomen: Soft, nontender, no hepatosplenomegaly, no distention.  Neurologic: Alert and oriented x 3.  Psych: Normal affect. Extremities: No clubbing or cyanosis.   Assessment/Plan: 1. CAD: Stable without chest pain.  Continue ASA and statin. 2. Chronic diastolic CHF: Stable volume on low dose Lasix.  No JVD.  BMET today.   3. Hyperlipidemia: Myalgias with Crestor, pravastatin, and atorvastatin 40 daily.  He is able to tolerate atorvastatin 20 daily but LDL has been too high.  Will check lipids today.  I am going to refer him to pharmacy clinic to be evaluated for [place].   4.  Carotid stenosis: Repeat carotids in 1/17.  5. 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 No lightheadedness.  I will have him stop metoprolol. 6. CKD: Stably elevated.   7. PAD: Difficult to palpate pedal pulses but ABIs normal in 3/15.  8. Aortic stenosis: Repeat echo to follow.  Loralie Champagne 01/24/2015

## 2015-01-27 ENCOUNTER — Ambulatory Visit (INDEPENDENT_AMBULATORY_CARE_PROVIDER_SITE_OTHER): Payer: Medicare Other | Admitting: Family Medicine

## 2015-01-27 ENCOUNTER — Encounter: Payer: Self-pay | Admitting: Family Medicine

## 2015-01-27 VITALS — BP 147/73 | HR 69 | Temp 97.4°F | Resp 16 | Ht 68.0 in | Wt 204.0 lb

## 2015-01-27 DIAGNOSIS — H6122 Impacted cerumen, left ear: Secondary | ICD-10-CM

## 2015-01-27 NOTE — Progress Notes (Signed)
Pre visit review using our clinic review tool, if applicable. No additional management support is needed unless otherwise documented below in the visit note. 

## 2015-01-27 NOTE — Progress Notes (Signed)
OFFICE NOTE  01/27/2015  CC:  Chief Complaint  Patient presents with  . Hearing Problem    Left ear     HPI: Patient is a 81 y.o. Caucasian male who is here for "can't hear out of L ear". Onset gradually a couple weeks ago, then last night got a lot worse, sounds like a "roar" in L ear.  When he covers R ear he can't hear anything.  No pain.     Pertinent PMH:  Past medical, surgical, social, and family history reviewed and no changes are noted since last office visit.  MEDS:  Outpatient Prescriptions Prior to Visit  Medication Sig Dispense Refill  . aspirin 325 MG tablet Take 325 mg by mouth daily. TAKE 1/2 TAB    . atorvastatin (LIPITOR) 20 MG tablet Take 1 tablet (20 mg total) by mouth daily. 90 tablet 1  . Cholecalciferol (VITAMIN D3) 1000 UNITS tablet Take 1,000 Units by mouth daily.      . Coenzyme Q10 200 MG TABS Take by mouth.  0  . FLUVIRIN SUSP ADM 0.5ML IM UTD  0  . furosemide (LASIX) 20 MG tablet Take 20 mg by mouth.    . levothyroxine (SYNTHROID, LEVOTHROID) 100 MCG tablet Take 100 mcg by mouth daily.      . meloxicam (MOBIC) 7.5 MG tablet Take 7.5 mg by mouth daily.    Marland Kitchen omeprazole (PRILOSEC) 20 MG capsule Take 40 mg by mouth daily.       No facility-administered medications prior to visit.    PE: Blood pressure 147/73, pulse 69, temperature 97.4 F (36.3 C), temperature source Oral, resp. rate 16, height 5\' 8"  (1.727 m), weight 204 lb (92.534 kg), SpO2 97 %. Gen: Alert, well appearing.  Patient is oriented to person, place, time, and situation. R EAC clear, TM normal. L EAC with 100% cerumen impaction.  IMPRESSION AND PLAN:  L EAC 100% cerumen impaction, plus patient had a cotton ball stuffed in [place]. Nurse was able to completely irrigate this and clear the EAC and pt felt better.  An After Visit Summary was printed and given to the patient.  FOLLOW UP: prn

## 2015-01-29 ENCOUNTER — Ambulatory Visit (HOSPITAL_COMMUNITY): Payer: Medicare Other | Attending: Cardiovascular Disease

## 2015-01-29 ENCOUNTER — Ambulatory Visit (INDEPENDENT_AMBULATORY_CARE_PROVIDER_SITE_OTHER): Payer: Medicare Other | Admitting: Pharmacist

## 2015-01-29 ENCOUNTER — Other Ambulatory Visit: Payer: Self-pay

## 2015-01-29 DIAGNOSIS — I350 Nonrheumatic aortic (valve) stenosis: Secondary | ICD-10-CM | POA: Diagnosis not present

## 2015-01-29 DIAGNOSIS — Z951 Presence of aortocoronary bypass graft: Secondary | ICD-10-CM | POA: Diagnosis not present

## 2015-01-29 DIAGNOSIS — I359 Nonrheumatic aortic valve disorder, unspecified: Secondary | ICD-10-CM | POA: Diagnosis present

## 2015-01-29 DIAGNOSIS — I517 Cardiomegaly: Secondary | ICD-10-CM | POA: Insufficient documentation

## 2015-01-29 DIAGNOSIS — I6523 Occlusion and stenosis of bilateral carotid arteries: Secondary | ICD-10-CM | POA: Diagnosis not present

## 2015-01-29 DIAGNOSIS — I351 Nonrheumatic aortic (valve) insufficiency: Secondary | ICD-10-CM | POA: Diagnosis not present

## 2015-01-29 DIAGNOSIS — I071 Rheumatic tricuspid insufficiency: Secondary | ICD-10-CM | POA: Insufficient documentation

## 2015-01-29 DIAGNOSIS — E785 Hyperlipidemia, unspecified: Secondary | ICD-10-CM | POA: Diagnosis not present

## 2015-01-29 DIAGNOSIS — Z6831 Body mass index (BMI) 31.0-31.9, adult: Secondary | ICD-10-CM | POA: Insufficient documentation

## 2015-01-29 DIAGNOSIS — I340 Nonrheumatic mitral (valve) insufficiency: Secondary | ICD-10-CM | POA: Insufficient documentation

## 2015-01-29 DIAGNOSIS — E669 Obesity, unspecified: Secondary | ICD-10-CM | POA: Insufficient documentation

## 2015-01-29 DIAGNOSIS — I10 Essential (primary) hypertension: Secondary | ICD-10-CM | POA: Insufficient documentation

## 2015-01-29 MED ORDER — EZETIMIBE 10 MG PO TABS: 10.0000 mg | ORAL_TABLET | Freq: Every day | ORAL | Status: DC

## 2015-01-29 NOTE — Patient Instructions (Signed)
Please pick up your prescription for Zetia (ezetimibe) 10mg  to start taking once daily. Keep taking your Lipitor (atorvastatin) 20mg  once daily as well. Return for lab work on Monday, January 9th. We will check your cholesterol then. Lab opens at 7:30am, come in any time after for fasting blood work. Call lipid clinic with any questions at [phone]

## 2015-01-29 NOTE — Progress Notes (Signed)
Patient ID: Allen Holloway                DOB: 12-Oct-1926, 81 yo                         MRN: [id number]     HPI: Allen Holloway is a 81 y.o. male patient referred to lipid clinic by Dr. Aundra Dubin. PMH is significant for CAD s/p CABG in 2005 with nonischemic myoview in 2008, HLD, HTN, CKD, and COPD. He has a history of statin intolerance with Crestor, pravastatin, simvastatin, and atorvastatin 40mg  (myalgias). However, he can tolerate atorvastatin 20mg  daily. He presents today with his wife for further lipid management.  Current Medications: atorvastatin 20mg  daily Intolerances: Crestor 10mg  (2013), simvastatin 40mg  (2013), pravastatin 20mg  (2014), atorvastatin 20mg  (2016) Risk Factors: CAD s/p CABG, sex, age LDL goal: 70mg /dL for secondary prevention, non-LDL goal 100mg /dL  Diet: Breakfast - 2 boiled eggs, toast with cinnamon and butter, sometimes oatmeal. Lunch - eats sometimes - maybe cheese and crackers. Dinner - salad or stew.  Exercise: Works in the garden and does yard work very frequently.  Labs: 04/2014: TC 206, TG 108, HDL 41.5, LDL 143, LFTs wnl (on atorvastatin 20mg )  Past Medical History  Diagnosis Date  . COPD (chronic obstructive pulmonary disease) ([place])   . Hyperlipidemia   . Hypertension   . Osteoarthritis   . GERD (gastroesophageal reflux disease)   . Hypothyroidism   . CAD (coronary artery disease)   . Carotid bruit     60-79% bilat In carot sten, stable over serial exams, most recent 04/2014--repeat 1 yr  . Cholelithiasis   . CKD (chronic kidney disease)   . Lumbago   . Diverticulosis   . Venous insufficiency of both lower extremities     Current Outpatient Prescriptions on File Prior to Visit  Medication Sig Dispense Refill  . aspirin 325 MG tablet Take 325 mg by mouth daily. TAKE 1/2 TAB    . atorvastatin (LIPITOR) 20 MG tablet Take 1 tablet (20 mg total) by mouth daily. 90 tablet 1  . Cholecalciferol (VITAMIN D3) 1000 UNITS tablet Take 1,000 Units by  mouth daily.      . Coenzyme Q10 200 MG TABS Take by mouth.  0  . FLUVIRIN SUSP ADM 0.5ML IM UTD  0  . furosemide (LASIX) 20 MG tablet Take 20 mg by mouth.    . levothyroxine (SYNTHROID, LEVOTHROID) 100 MCG tablet Take 100 mcg by mouth daily.      . meloxicam (MOBIC) 7.5 MG tablet Take 7.5 mg by mouth daily.    Marland Kitchen omeprazole (PRILOSEC) 20 MG capsule Take 40 mg by mouth daily.       No current facility-administered medications on file prior to visit.    Allergies  Allergen Reactions  . Amlodipine     edema  . Benazepril Hcl     REACTION: elev K    Assessment/Plan:  1. Hyperlipidemia - patient currently takes atorvastatin 20mg  daily (max dose tolerated statin) and has had previous intolerances to Crestor, simvastatin, pravastatin, and higher doses of atorvastatin. His LDL goal is 70mg /dL given history of CAD s/p CABG in 2005. Most recent lipid panel was in January of 2016. Will start Zetia 10mg  daily and recheck lipid panel in 3 months. If LDL is still above goal at that time, patient would likely qualify for PCSK9 inhibitor. Patient and his wife are in agreement with plan.   Natalye Kott E. Burke Terry, PharmD  [facility] [address] [place], [place] [address] Phone: [phone]; Fax: [phone] 01/29/2015 2:15 PM

## 2015-02-01 ENCOUNTER — Telehealth: Payer: Self-pay | Admitting: Cardiology

## 2015-02-01 NOTE — Telephone Encounter (Signed)
Spoke with patient about recent echo results 

## 2015-02-01 NOTE — Telephone Encounter (Signed)
New message ° ° ° ° ° °Returning a call to get echo results °

## 2015-02-02 ENCOUNTER — Encounter: Payer: Self-pay | Admitting: Gastroenterology

## 2015-02-24 ENCOUNTER — Other Ambulatory Visit: Payer: Self-pay | Admitting: Cardiology

## 2015-03-21 IMAGING — CR DG RIBS W/ CHEST 3+V*L*
4 series · 4 of 4 positions shown · non-contrast
Comparison: None.

CLINICAL DATA: Productive cough.  Rib pain.

EXAM:
LEFT RIBS AND CHEST - 3+ VIEW

[w chest pa]
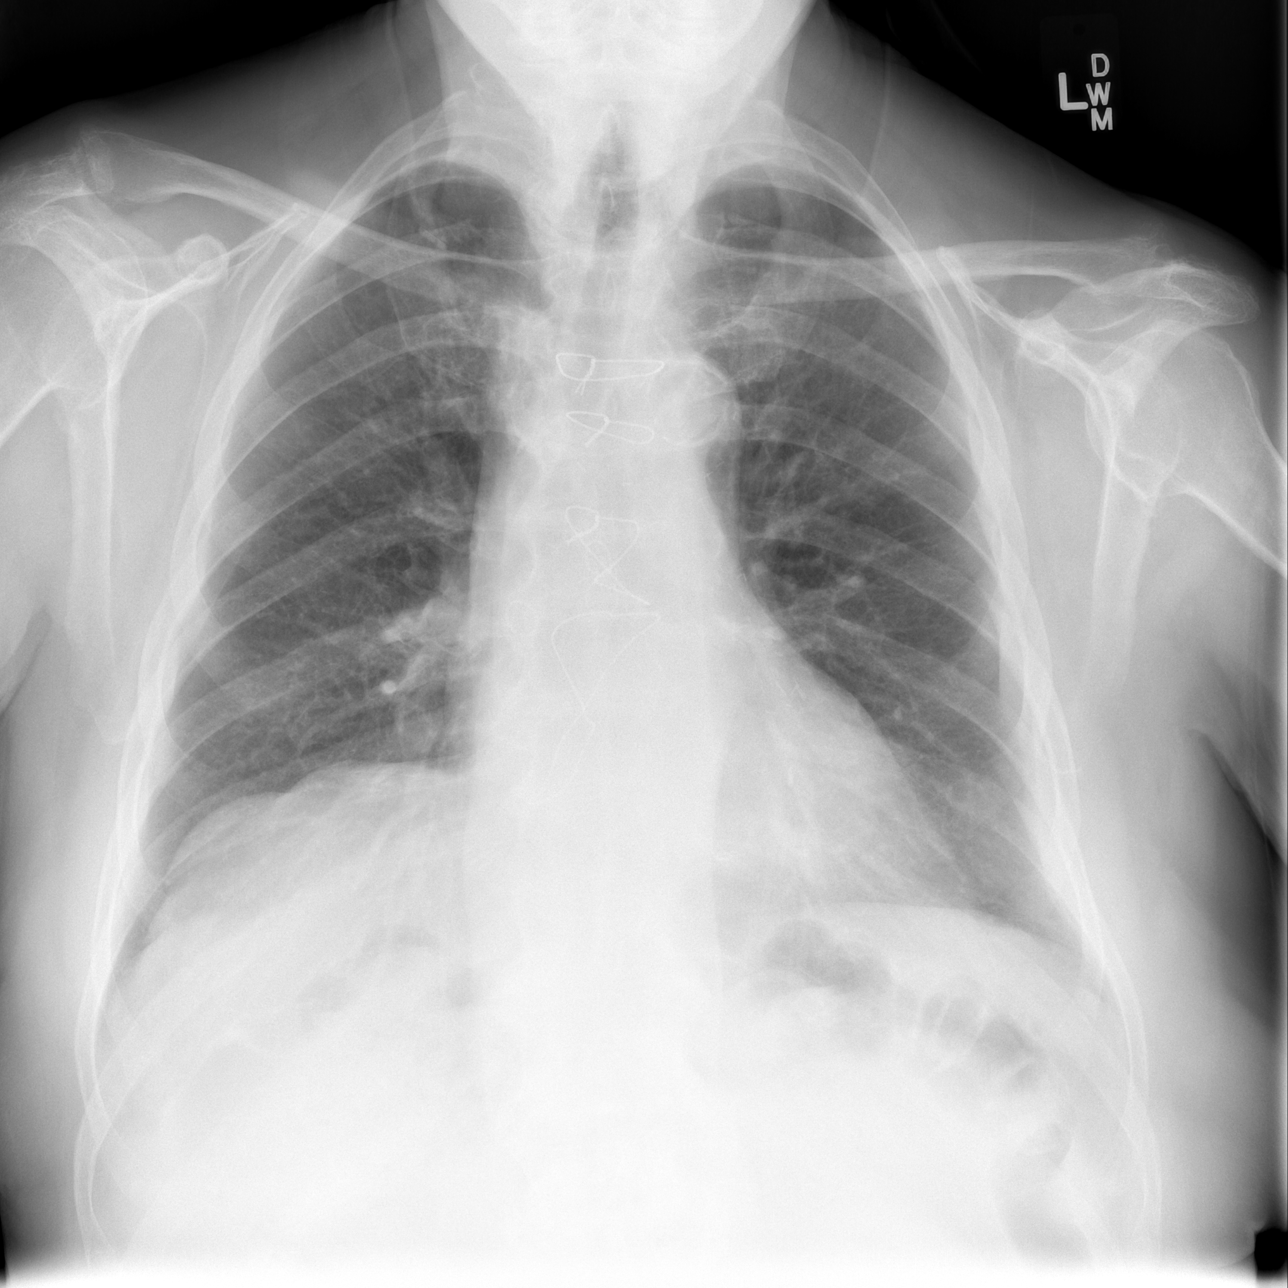

[w ribs ap/pa upper left]
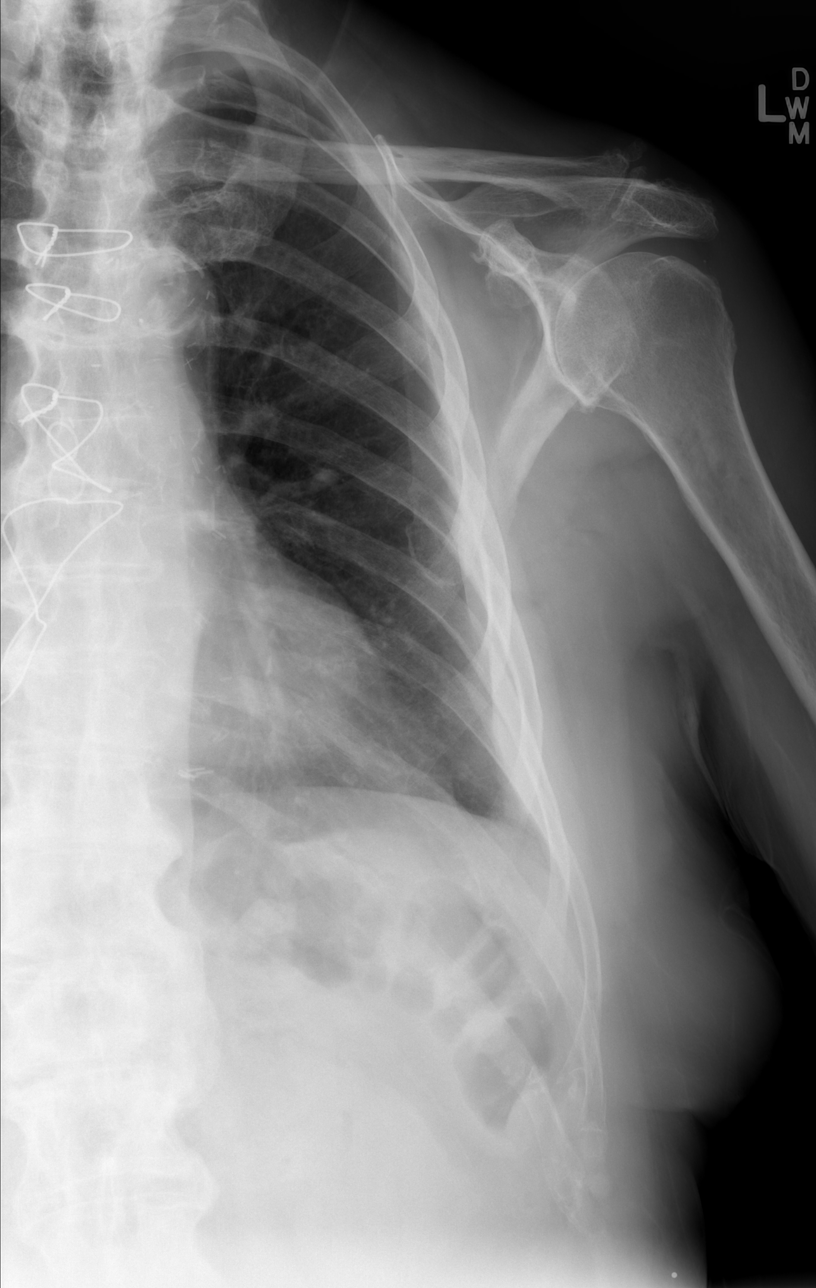

[w ribs ap/pa lower left]
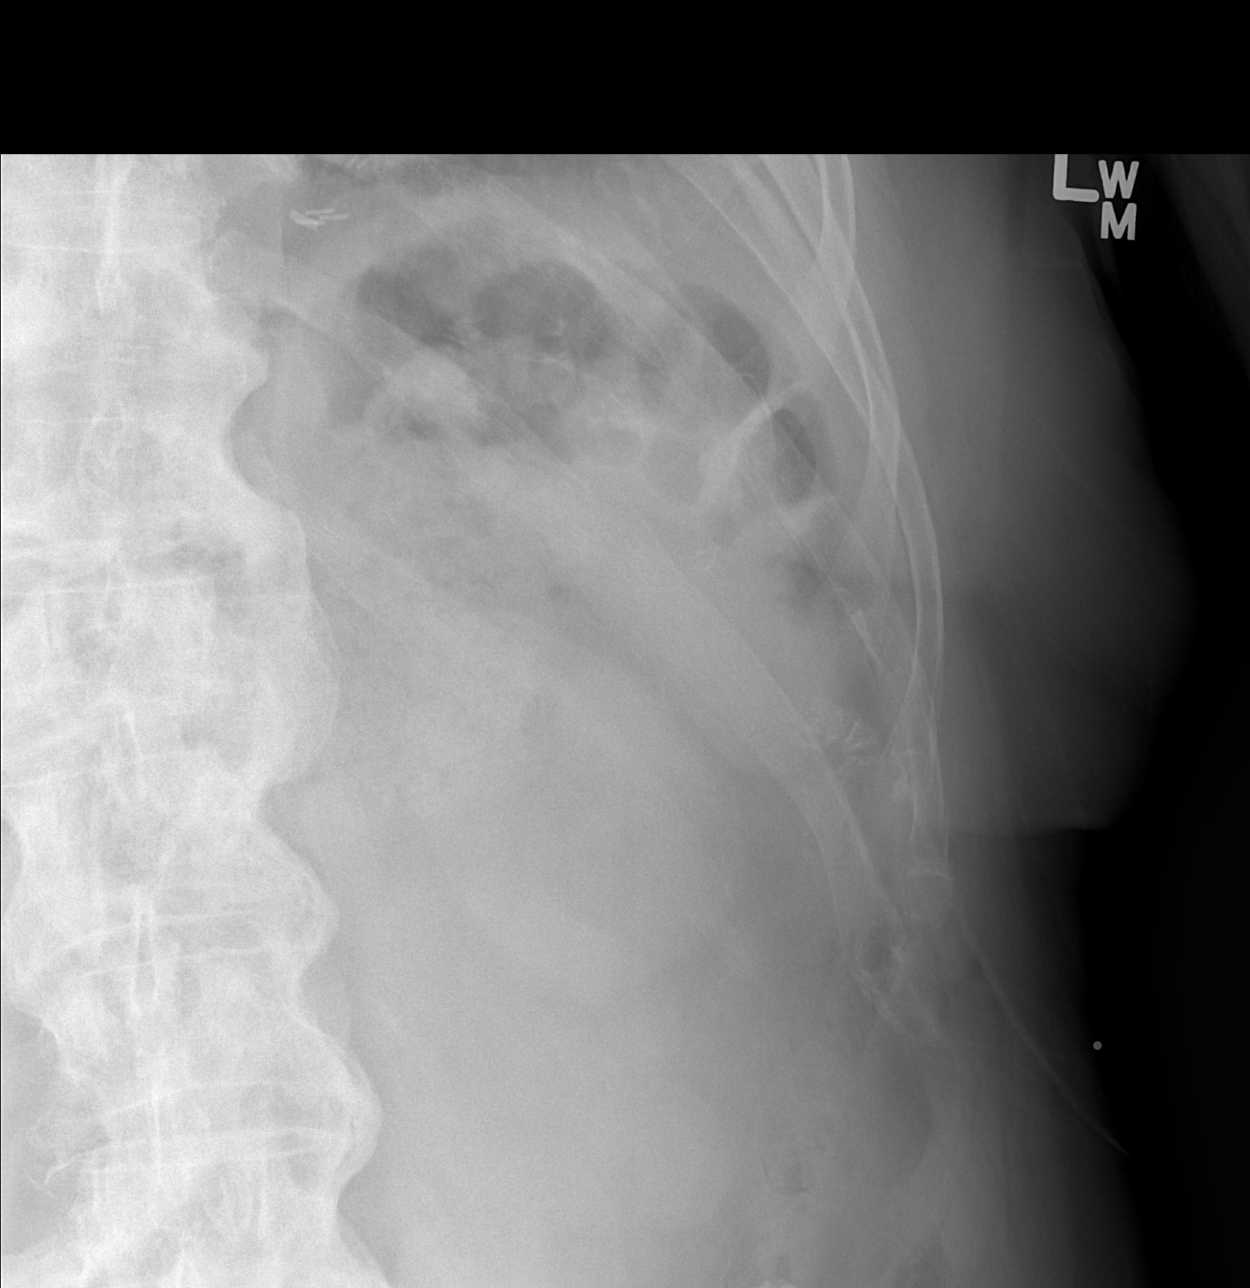

[w ribs oblique left]
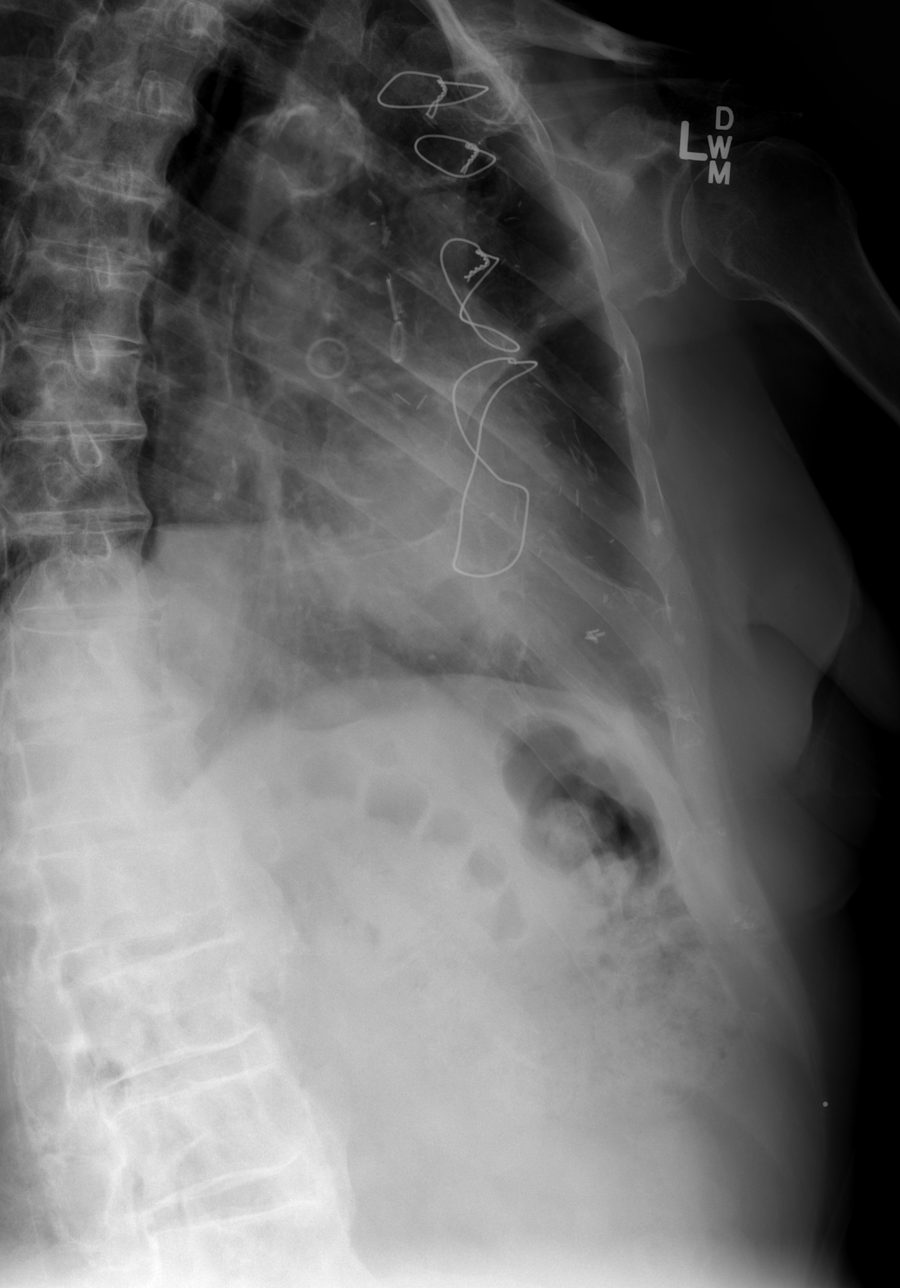

[4 of 4 positions shown; findings below may reference images not displayed]

FINDINGS: Mediastinum and hilar structures normal. Heart size normal. Prior
CABG. No pleural effusion or pneumothorax. Nondisplaced posterior
lateral left tenth rib fracture. Ankylosis of the lumbar spine
suggesting ankylosing spondylitis.
IMPRESSION: 1. Nondisplaced posterior lateral left tenth rib fracture.
2. Probable ankylosing spondylitis.
3. Prior CABG.

## 2015-05-03 ENCOUNTER — Other Ambulatory Visit: Payer: Medicare Other

## 2015-05-04 ENCOUNTER — Other Ambulatory Visit (INDEPENDENT_AMBULATORY_CARE_PROVIDER_SITE_OTHER): Payer: Medicare Other | Admitting: *Deleted

## 2015-05-04 DIAGNOSIS — I10 Essential (primary) hypertension: Secondary | ICD-10-CM

## 2015-05-04 DIAGNOSIS — E785 Hyperlipidemia, unspecified: Secondary | ICD-10-CM | POA: Diagnosis not present

## 2015-05-04 LAB — HEPATIC FUNCTION PANEL
ALK PHOS: 77 U/L (ref 40–115)
ALT: 16 U/L (ref 9–46)
AST: 20 U/L (ref 10–35)
Albumin: 4 g/dL (ref 3.6–5.1)
BILIRUBIN INDIRECT: 0.4 mg/dL (ref 0.2–1.2)
Bilirubin, Direct: 0.1 mg/dL (ref <=0.2)
Total Bilirubin: 0.5 mg/dL (ref 0.2–1.2)
Total Protein: 6.6 g/dL (ref 6.1–8.1)

## 2015-05-04 LAB — LIPID PANEL
CHOLESTEROL: 96 mg/dL — AB (ref 125–200)
HDL: 47 mg/dL (ref >=40)
LDL Cholesterol: 39 mg/dL (ref <130)
TRIGLYCERIDES: 49 mg/dL (ref <150)
Total CHOL/HDL Ratio: 2 Ratio (ref <=5.0)
VLDL: 10 mg/dL (ref <30)

## 2015-05-04 NOTE — Addendum Note (Signed)
Addended by: Eulis Foster on: 05/04/2015 08:10 AM   Modules accepted: Orders

## 2015-05-04 NOTE — Addendum Note (Signed)
Addended by: Eulis Foster on: 05/04/2015 07:59 AM   Modules accepted: Orders

## 2015-05-10 ENCOUNTER — Other Ambulatory Visit: Payer: Self-pay | Admitting: Pharmacist

## 2015-05-17 ENCOUNTER — Ambulatory Visit (HOSPITAL_COMMUNITY)
Admission: RE | Admit: 2015-05-17 | Discharge: 2015-05-17 | Disposition: A | Discharge status: Home / Self Care | Payer: Medicare Other | Source: Ambulatory Visit | Attending: Cardiovascular Disease | Admitting: Cardiovascular Disease

## 2015-05-17 DIAGNOSIS — N189 Chronic kidney disease, unspecified: Secondary | ICD-10-CM | POA: Insufficient documentation

## 2015-05-17 DIAGNOSIS — I350 Nonrheumatic aortic (valve) stenosis: Secondary | ICD-10-CM | POA: Diagnosis not present

## 2015-05-17 DIAGNOSIS — I129 Hypertensive chronic kidney disease with stage 1 through stage 4 chronic kidney disease, or unspecified chronic kidney disease: Secondary | ICD-10-CM | POA: Insufficient documentation

## 2015-05-17 DIAGNOSIS — E785 Hyperlipidemia, unspecified: Secondary | ICD-10-CM | POA: Diagnosis not present

## 2015-05-17 DIAGNOSIS — I6523 Occlusion and stenosis of bilateral carotid arteries: Secondary | ICD-10-CM | POA: Diagnosis not present

## 2015-09-14 ENCOUNTER — Telehealth: Payer: Self-pay | Admitting: Family Medicine

## 2015-09-14 DIAGNOSIS — M25551 Pain in right hip: Secondary | ICD-10-CM

## 2015-09-14 NOTE — Telephone Encounter (Signed)
Please advise. Thanks.  

## 2015-09-14 NOTE — Telephone Encounter (Signed)
Pt advised and voiced understanding.   

## 2015-09-14 NOTE — Telephone Encounter (Signed)
Referral to Dr. Alvan Dame ordered as per pt request.

## 2015-09-14 NOTE — Telephone Encounter (Signed)
Patient is requesting referral to Dr. Alvan Dame at [facility] for injection in R hip

## 2015-09-30 DIAGNOSIS — M7061 Trochanteric bursitis, right hip: Secondary | ICD-10-CM | POA: Diagnosis not present

## 2015-09-30 DIAGNOSIS — M25551 Pain in right hip: Secondary | ICD-10-CM | POA: Diagnosis not present

## 2015-10-20 ENCOUNTER — Encounter: Payer: Self-pay | Admitting: Family Medicine

## 2015-11-03 DIAGNOSIS — M7061 Trochanteric bursitis, right hip: Secondary | ICD-10-CM | POA: Diagnosis not present

## 2015-11-16 DIAGNOSIS — L03032 Cellulitis of left toe: Secondary | ICD-10-CM | POA: Diagnosis not present

## 2015-12-14 ENCOUNTER — Telehealth (HOSPITAL_COMMUNITY): Payer: Self-pay

## 2015-12-14 NOTE — Telephone Encounter (Signed)
Patient's wife called CHF clinic triage line and left message describing patient having weeping legs, "fluid coming out of his legs". Left return VM asking to call us back and let us know what patient's weight is, if any other s/s are present, and how much fluid pills he has been taking (last documented 20 mg lasix once daily). Has not seen Dr. Aundra Dubin either since October of 2016, seems he was seeing Aundra Dubin at [facility] office for general cards, will forward to [facility] to see if he needs to be an advanced heart failure patient or be referred to [facility] general cards.  Renee Pain, RN

## 2015-12-14 NOTE — Telephone Encounter (Signed)
Has diastolic HF, see his wife in HF clinic so let's see if we can work him in with me in the next week or so at [place] clinic.  Can increase Lasix to 40 mg daily with BMET/BNP in a week for now until I see him.

## 2015-12-15 MED ORDER — FUROSEMIDE 20 MG PO TABS: 40.0000 mg | ORAL_TABLET | Freq: Every day | ORAL | 11 refills | Status: DC

## 2015-12-15 NOTE — Telephone Encounter (Signed)
Advised patient's wife per Dr. Aundra Dubin to increase alsix to 40 mg once daily until we are able to see him in CHF clinic on 01/07/16. Wife states patient went to [place] yesterday to be evaluated and had leg wraps placed for compression and edema. Patient "doing much better" per wife report. Aware and agreeable to plan as stated above, will return call to CHF clinic if patient's condition worsens.  Renee Pain, RN

## 2016-01-05 DIAGNOSIS — M7061 Trochanteric bursitis, right hip: Secondary | ICD-10-CM | POA: Diagnosis not present

## 2016-01-05 DIAGNOSIS — M25551 Pain in right hip: Secondary | ICD-10-CM | POA: Diagnosis not present

## 2016-01-07 ENCOUNTER — Telehealth (HOSPITAL_COMMUNITY): Payer: Self-pay | Admitting: Vascular Surgery

## 2016-01-07 ENCOUNTER — Ambulatory Visit (HOSPITAL_COMMUNITY)
Admission: RE | Admit: 2016-01-07 | Discharge: 2016-01-07 | Disposition: A | Discharge status: Home / Self Care | Payer: Medicare Other | Source: Ambulatory Visit | Attending: Cardiology | Admitting: Cardiology

## 2016-01-07 ENCOUNTER — Encounter (HOSPITAL_COMMUNITY): Payer: Self-pay

## 2016-01-07 VITALS — BP 144/86 | HR 87 | Wt 205.4 lb

## 2016-01-07 DIAGNOSIS — Z951 Presence of aortocoronary bypass graft: Secondary | ICD-10-CM | POA: Diagnosis not present

## 2016-01-07 DIAGNOSIS — E785 Hyperlipidemia, unspecified: Secondary | ICD-10-CM | POA: Diagnosis not present

## 2016-01-07 DIAGNOSIS — E039 Hypothyroidism, unspecified: Secondary | ICD-10-CM | POA: Diagnosis not present

## 2016-01-07 DIAGNOSIS — I6523 Occlusion and stenosis of bilateral carotid arteries: Secondary | ICD-10-CM

## 2016-01-07 DIAGNOSIS — I350 Nonrheumatic aortic (valve) stenosis: Secondary | ICD-10-CM | POA: Insufficient documentation

## 2016-01-07 DIAGNOSIS — J449 Chronic obstructive pulmonary disease, unspecified: Secondary | ICD-10-CM | POA: Diagnosis not present

## 2016-01-07 DIAGNOSIS — Z87891 Personal history of nicotine dependence: Secondary | ICD-10-CM | POA: Diagnosis not present

## 2016-01-07 DIAGNOSIS — K219 Gastro-esophageal reflux disease without esophagitis: Secondary | ICD-10-CM | POA: Diagnosis not present

## 2016-01-07 DIAGNOSIS — Z7982 Long term (current) use of aspirin: Secondary | ICD-10-CM | POA: Diagnosis not present

## 2016-01-07 DIAGNOSIS — I441 Atrioventricular block, second degree: Secondary | ICD-10-CM | POA: Diagnosis not present

## 2016-01-07 DIAGNOSIS — N183 Chronic kidney disease, stage 3 (moderate): Secondary | ICD-10-CM | POA: Insufficient documentation

## 2016-01-07 DIAGNOSIS — I2510 Atherosclerotic heart disease of native coronary artery without angina pectoris: Secondary | ICD-10-CM | POA: Diagnosis not present

## 2016-01-07 DIAGNOSIS — M1990 Unspecified osteoarthritis, unspecified site: Secondary | ICD-10-CM | POA: Insufficient documentation

## 2016-01-07 DIAGNOSIS — I130 Hypertensive heart and chronic kidney disease with heart failure and stage 1 through stage 4 chronic kidney disease, or unspecified chronic kidney disease: Secondary | ICD-10-CM | POA: Insufficient documentation

## 2016-01-07 DIAGNOSIS — I5032 Chronic diastolic (congestive) heart failure: Secondary | ICD-10-CM | POA: Insufficient documentation

## 2016-01-07 DIAGNOSIS — R9431 Abnormal electrocardiogram [ECG] [EKG]: Secondary | ICD-10-CM

## 2016-01-07 DIAGNOSIS — R001 Bradycardia, unspecified: Secondary | ICD-10-CM

## 2016-01-07 LAB — BASIC METABOLIC PANEL
ANION GAP: 9 (ref 5–15)
BUN: 33 mg/dL — AB (ref 6–20)
CALCIUM: 10 mg/dL (ref 8.9–10.3)
CO2: 21 mmol/L — ABNORMAL LOW (ref 22–32)
Chloride: 107 mmol/L (ref 101–111)
Creatinine, Ser: 1.96 mg/dL — ABNORMAL HIGH (ref 0.61–1.24)
GFR calc Af Amer: 34 mL/min — ABNORMAL LOW (ref >60)
GFR, EST NON AFRICAN AMERICAN: 29 mL/min — AB (ref >60)
GLUCOSE: 180 mg/dL — AB (ref 65–99)
Potassium: 4.2 mmol/L (ref 3.5–5.1)
SODIUM: 137 mmol/L (ref 135–145)

## 2016-01-07 MED ORDER — FUROSEMIDE 20 MG PO TABS: ORAL_TABLET | ORAL | 6 refills | Status: DC

## 2016-01-07 NOTE — Telephone Encounter (Signed)
Sent in box message to [facility] to schedule 24 hour holter monitor

## 2016-01-07 NOTE — Patient Instructions (Signed)
INCREASE Lasix as follows: Take 20 mg (1 tab) once daily alternating with 40 mg (2 tabs) the next day continuously.  STOP metoprolol.  Routine lab work today. Will notify you of abnormal results, otherwise no news is good news!  Repeat labs in 2 weeks.  Follow up 1 year with Dr. Aundra Dubin.  Do the following things EVERYDAY: 1) Weigh yourself in the morning before breakfast. Write it down and keep it in a log. 2) Take your medicines as prescribed 3) Eat low salt foods-Limit salt (sodium) to 2000 mg per day.  4) Stay as active as you can everyday 5) Limit all fluids for the day to less than 2 liters

## 2016-01-08 NOTE — Progress Notes (Signed)
Patient ID: Allen Holloway, male   DOB: 1926-06-17, 82 y.o.   MRN: [id number] PCP: Dr. Anitra Lauth Cardiology: Dr. Aundra Dubin  82 yo with history of CAD s/p CABG in 2005 with nonischemic myoview in 2008 presents for cardiology followup. Last echo in 10/16 showed EF 55-60%, mild MR, moderate TR.  He has had no chest pain.  No dyspnea walking on flat ground.  Mild dyspnea with steps.  No orthopnea/PND.  He has stable lower extremity edema.  Today's ECG shows sinus rhythm alternating with what appears to be junctional rhythm.  He has had conduction abnormalities noted in the past and was taken off metoprolol.  He denies lightheadedness or syncope. Myalgias with Crestor, pravastatin, and atorvastatin 40.  He can tolerate atorvastatin 20.   Labs (6/11): K 5.3, creatinine 1.6, LDL 70, HDL 58, HCT 37, TSH normal  Labs (11/12): K 4.5, creatinine 1.9, LDL 77, HDL 60 Labs (5/14): BNP 296 Labs (9/14): K 4.5, creatinine 1.8 Labs (10/14): K 4.6, creatinine 1.8, BNP 203 Labs (11/14): LDL 86, HDL 43, K 4.8, creatinine 2.0 Labs (1/16): K 5, creatinine 2.0, LDL 143, HDL 41.5 Labs (1/17): LDL 39, HDL 47, K 5, creatinine 2.0  ECG: A couple of sinus beats noted, predominantly appears junctional.   Allergies (verified):  1) Lotensin (Benazepril Hcl)   Past Medical History:  1. COPD  2. Coronary artery disease: CABG 2005 with LIMA-LAD, SVG-PDA, SVG-D1, SVG-OM. Myoview (9/08) showed EF 63%, no scar or ischemia.  Echo (8/11): EF 55%, mild AI, mild MR, moderate TR, mildly dilated RV, PA systolic pressure 34 mmHg.  Echo (6/14): EF 50-55%, mild LVH, mild AS, mild MR, mildly dilated RV.  - Echo (10/16): EF 55-60%, mild MR, moderate TR.  3. Hyperlipidemia: Myalgias Lipitor and Crestor, 40 mg Lipitor.  4. Hypertension  5. Carotid bruit: Carotid dopplers (8/12) with 24-09% RICA, 73-53% LICA.  Carotid dopplers (4/14) with 60-79% bilateral ICA stenosis.  Carotid dopplers (10/14) with 60-79% bilateral ICA stenosis. Carotid dopplers  (1/16) with 60-79% BICA stenosis.  - Carotid doppler (1/17): 29-92% RICA, 42-68% LICA.  6. Cholelithiasis  7. CKD  8. Low back pain  9. Osteoarthritis  10. GERD  11. Hypothyroidism  12. ABIs normal 8/11 and in 3/15.  13. 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  14. Chronic diastolic CHF 15. Aortic stenosis: Mild on 6/14 echo, minimal on 10/16 echo.   Family History:  Family History High cholesterol  Family History Hypertension   Social History:  Retired  Married  Former Smoker   ROS: All systems reviewed and negative except as per HPI.   Current Outpatient Prescriptions  Medication Sig Dispense Refill  . aspirin 325 MG tablet Take 325 mg by mouth daily. TAKE 1/2 TAB    . atorvastatin (LIPITOR) 20 MG tablet Take 1 tablet (20 mg total) by mouth daily. 90 tablet 1  . Cholecalciferol (VITAMIN D3) 1000 UNITS tablet Take 1,000 Units by mouth daily.      . Coenzyme Q10 200 MG TABS Take by mouth.  0  . furosemide (LASIX) 20 MG tablet Take 20 mg (1 tab) once daily alternating with 40 mg (2 tabs) once the next day. 90 tablet 6  . levothyroxine (SYNTHROID, LEVOTHROID) 100 MCG tablet Take 100 mcg by mouth daily.      . meloxicam (MOBIC) 7.5 MG tablet Take 7.5 mg by mouth daily.    Marland Kitchen omeprazole (PRILOSEC) 20 MG capsule Take 40 mg  by mouth daily.      Marland Kitchen FLUVIRIN SUSP ADM 0.5ML IM UTD  0   No current facility-administered medications for this encounter.     BP (!) 144/86 (BP Location: Right Arm, Patient Position: Sitting, Cuff Size: Normal)   Pulse 87   Wt 205 lb 6.4 oz (93.2 kg)   SpO2 98%   BMI 31.23 kg/m  General: NAD Neck: JVP 8 cm, no thyromegaly or thyroid nodule.  Lungs: Clear to auscultation bilaterally with normal respiratory effort. CV: Nondisplaced PMI.  Heart regular S1/S2, no S3/S4, 1/6 early SEM RUSB.  1+ edema 1/2 to knees bilaterally.   Bilateral carotid bruits. Difficult to palpate pedal pulses.    Abdomen: Soft,  nontender, no hepatosplenomegaly, no distention.  Neurologic: Alert and oriented x 3.  Psych: Normal affect. Extremities: No clubbing or cyanosis.   Assessment/Plan: 1. CAD: Stable without chest pain.  Continue ASA and statin. 2. Chronic diastolic CHF: Suspect mild volume overload.   - Increase Lasix to 40 daily alternating with 20 daily. BMET today and again in 2 wks.  3. Hyperlipidemia: Myalgias with Crestor, pravastatin, and atorvastatin 40 daily.  He is able to tolerate atorvastatin 20 daily.  Excellent lipids in 1/17.    4. Carotid stenosis: Repeat carotids in 1/18.  5. Conduction system disease: Type I 2nd degree AV block noted in the past, today junctional rhythm (not slow) noted.  No symptoms of bradycardia.  He is no longer taking metoprolol.  - 48 hour holter ordered.  6. CKD: Stage III.  Check BMET today.   7. PAD: Difficult to palpate pedal pulses but ABIs normal in 3/15.  8. Aortic stenosis: Most recent echo showed minimal aortic stenosis.   Loralie Champagne 01/08/2016

## 2016-01-09 ENCOUNTER — Encounter: Payer: Self-pay | Admitting: Family Medicine

## 2016-01-19 ENCOUNTER — Ambulatory Visit (INDEPENDENT_AMBULATORY_CARE_PROVIDER_SITE_OTHER): Payer: Medicare Other

## 2016-01-19 ENCOUNTER — Other Ambulatory Visit (HOSPITAL_COMMUNITY): Payer: Self-pay | Admitting: Cardiology

## 2016-01-19 DIAGNOSIS — R9431 Abnormal electrocardiogram [ECG] [EKG]: Secondary | ICD-10-CM

## 2016-01-19 DIAGNOSIS — I498 Other specified cardiac arrhythmias: Secondary | ICD-10-CM | POA: Diagnosis not present

## 2016-01-21 ENCOUNTER — Ambulatory Visit (HOSPITAL_COMMUNITY)
Admission: RE | Admit: 2016-01-21 | Discharge: 2016-01-21 | Disposition: A | Discharge status: Home / Self Care | Payer: Medicare Other | Source: Ambulatory Visit | Attending: Cardiology | Admitting: Cardiology

## 2016-01-21 DIAGNOSIS — I5032 Chronic diastolic (congestive) heart failure: Secondary | ICD-10-CM | POA: Diagnosis not present

## 2016-01-21 LAB — BASIC METABOLIC PANEL
Anion gap: 7 (ref 5–15)
BUN: 30 mg/dL — AB (ref 6–20)
CHLORIDE: 104 mmol/L (ref 101–111)
CO2: 29 mmol/L (ref 22–32)
Calcium: 9.8 mg/dL (ref 8.9–10.3)
Creatinine, Ser: 1.93 mg/dL — ABNORMAL HIGH (ref 0.61–1.24)
GFR calc Af Amer: 34 mL/min — ABNORMAL LOW (ref >60)
GFR calc non Af Amer: 30 mL/min — ABNORMAL LOW (ref >60)
Glucose, Bld: 106 mg/dL — ABNORMAL HIGH (ref 65–99)
POTASSIUM: 4.3 mmol/L (ref 3.5–5.1)
SODIUM: 140 mmol/L (ref 135–145)

## 2016-02-03 ENCOUNTER — Telehealth: Payer: Self-pay | Admitting: Cardiology

## 2016-02-03 NOTE — Telephone Encounter (Signed)
Follow Up:     Returning call from yesterday.concerning his monitor results. If he is not there please leave results with his wife or on his answering machine please.

## 2016-02-03 NOTE — Telephone Encounter (Signed)
Pt asked to give his wife the Monitor results. Wife is aware that the heart Monitor Show predominately NSR. Has episodes of  Type 1 2nd degree AV block. No indication for pacemaker. Pt's wife verbalized understanding.

## 2016-02-23 DIAGNOSIS — M7061 Trochanteric bursitis, right hip: Secondary | ICD-10-CM | POA: Diagnosis not present

## 2016-02-23 DIAGNOSIS — M25551 Pain in right hip: Secondary | ICD-10-CM | POA: Diagnosis not present

## 2016-05-04 DIAGNOSIS — B351 Tinea unguium: Secondary | ICD-10-CM | POA: Diagnosis not present

## 2016-05-04 DIAGNOSIS — L03032 Cellulitis of left toe: Secondary | ICD-10-CM | POA: Diagnosis not present

## 2016-05-05 ENCOUNTER — Encounter: Payer: Self-pay | Admitting: Family Medicine

## 2016-05-05 ENCOUNTER — Ambulatory Visit (INDEPENDENT_AMBULATORY_CARE_PROVIDER_SITE_OTHER): Payer: Medicare Other | Admitting: Family Medicine

## 2016-05-05 VITALS — BP 125/62 | HR 72 | Temp 97.9°F | Resp 16 | Ht 68.0 in | Wt 206.2 lb

## 2016-05-05 DIAGNOSIS — I441 Atrioventricular block, second degree: Secondary | ICD-10-CM | POA: Diagnosis not present

## 2016-05-05 DIAGNOSIS — I959 Hypotension, unspecified: Secondary | ICD-10-CM

## 2016-05-05 DIAGNOSIS — R531 Weakness: Secondary | ICD-10-CM | POA: Diagnosis not present

## 2016-05-05 LAB — CBC WITH DIFFERENTIAL/PLATELET
Basophils Absolute: 0 10*3/uL (ref 0.0–0.1)
Basophils Relative: 0.3 % (ref 0.0–3.0)
EOS ABS: 0.1 10*3/uL (ref 0.0–0.7)
EOS PCT: 1 % (ref 0.0–5.0)
HCT: 37.3 % — ABNORMAL LOW (ref 39.0–52.0)
HEMOGLOBIN: 12.4 g/dL — AB (ref 13.0–17.0)
LYMPHS ABS: 2.2 10*3/uL (ref 0.7–4.0)
Lymphocytes Relative: 22.7 % (ref 12.0–46.0)
MCHC: 33.2 g/dL (ref 30.0–36.0)
MCV: 87.8 fl (ref 78.0–100.0)
MONO ABS: 0.7 10*3/uL (ref 0.1–1.0)
Monocytes Relative: 7.6 % (ref 3.0–12.0)
NEUTROS PCT: 68.4 % (ref 43.0–77.0)
Neutro Abs: 6.6 10*3/uL (ref 1.4–7.7)
PLATELETS: 191 10*3/uL (ref 150.0–400.0)
RBC: 4.25 Mil/uL (ref 4.22–5.81)
RDW: 15.4 % (ref 11.5–15.5)
WBC: 9.6 10*3/uL (ref 4.0–10.5)

## 2016-05-05 NOTE — Progress Notes (Addendum)
OFFICE VISIT  05/05/2016   CC:  Chief Complaint  Patient presents with  . Dizziness    one episode monday 05/01/16   HPI:    Patient is a 83 y.o. Caucasian male who presents for an episode of generalized weakness that occurred 4 days ago when on the couch watching tv.  He felt the urge to urinate and was trying to get up to go to the bathroom but could not pick himself up.  Slipped onto floor from couch and tried getting up at that point and still no luck.  Eventually he woke his wife up and they got a neighbor to help get him up off floor.  He did not feel any dizziness.  No focal weakness.  No slurred speech.  BP was checked and it was 90 systolic.  He rested on the couch.  BP next morning was 703 systolic and he was able to get up normally.  No recurrence since then. Has never happened before this.  He sustained some facial abrasions from his floor when this happened. The day or 2 prior to this episode he had been feeling mild f/c/malaise, slight URI and cough.  Eating and drinking normally.  NO CP or palpitations.  Past Medical History:  Diagnosis Date  . CAD (coronary artery disease)   . Carotid bruit    60-79% bilat In carot sten, stable over serial exams.  04/2015 stable 50-09% RICA, 38-18% LICA.  Repeat 1 yr.  . Cholelithiasis   . Chronic diastolic heart failure ([place])    CHF clinic  . Chronic renal insufficiency, stage III (moderate)    GFR 30s  . 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  Marland Kitchen COPD (chronic obstructive pulmonary disease) ([place])   . Diverticulosis   . GERD (gastroesophageal reflux disease)   . Hyperlipidemia   . Hypertension   . Hypothyroidism   . Lumbago   . Osteoarthritis   . Right hip pain    Trochanteric bursitis: Dr. Alvan Dame injected this 09/2015.  Hip x-rays normal at Dr. Aurea Graff office.  . Venous insufficiency of both  lower extremities     Past Surgical History:  Procedure Laterality Date  . APPENDECTOMY    . CORONARY ARTERY BYPASS GRAFT  2005   LIMA-LAD,SVG-PDA,SVG-D1,SVG-OM.  Myoview (9/08) showed EF63%,no scaror ischemia.  . TRANSTHORACIC ECHOCARDIOGRAM  01/29/2015   EF 55-60%, normal LV function and wall motion.  Mod tricusp regurg.    Outpatient Medications Prior to Visit  Medication Sig Dispense Refill  . aspirin 325 MG tablet Take 162.5 mg by mouth daily. TAKE 1/2 TAB     . atorvastatin (LIPITOR) 20 MG tablet Take 1 tablet (20 mg total) by mouth daily. (Patient taking differently: Take 10 mg by mouth daily. ) 90 tablet 1  . Cholecalciferol (VITAMIN D3) 1000 UNITS tablet Take 1,000 Units by mouth daily.      . Coenzyme Q10 200 MG TABS Take 1 tablet by mouth daily.   0  . furosemide (LASIX) 20 MG tablet Take 20 mg (1 tab) once daily alternating with 40 mg (2 tabs) once the next day. 90 tablet 6  . levothyroxine (SYNTHROID, LEVOTHROID) 100 MCG tablet Take 100 mcg by mouth daily.      . meloxicam (MOBIC) 7.5 MG tablet Take 7.5 mg by mouth daily.    Marland Kitchen omeprazole (PRILOSEC)  20 MG capsule Take 20 mg by mouth daily.     Marland Kitchen FLUVIRIN SUSP ADM 0.5ML IM UTD  0   No facility-administered medications prior to visit.     Allergies  Allergen Reactions  . Amlodipine     edema  . Benazepril Hcl     REACTION: elev K    ROS As per HPI  PE: Blood pressure 125/62, pulse 72, temperature 97.9 F (36.6 C), temperature source Oral, resp. rate 16, height 5\' 8"  (1.727 m), weight 206 lb 4 oz (93.6 kg), SpO2 97 %. Gen: Alert, well appearing.  Patient is oriented to person, place, time, and situation. Facial abrasions noted on forehead and cheeks.  Bruise on R jaw line. OFV:WAQL: no injection, icteris, swelling, or exudate.  EOMI, PERRLA. Mouth: lips without lesion/swelling.  Oral mucosa pink and moist. Oropharynx without erythema, exudate, or swelling.  CV: RRR, no m/r/g.   LUNGS: CTA bilat, nonlabored  resps, good aeration in all lung fields. Neuro: CN 2-12 intact bilaterally, strength 5/5 in proximal and distal upper extremities and lower extremities bilaterally.  No tremor.  Walks slowly with a cain.    LABS:  Lab Results  Component Value Date   WBC 6.8 12/31/2013   HGB 13.5 12/31/2013   HCT 41.0 12/31/2013   MCV 89.5 12/31/2013   PLT 189.0 12/31/2013     Chemistry      Component Value Date/Time   NA 140 01/21/2016 1009   K 4.3 01/21/2016 1009   CL 104 01/21/2016 1009   CO2 29 01/21/2016 1009   BUN 30 (H) 01/21/2016 1009   CREATININE 1.93 (H) 01/21/2016 1009      Component Value Date/Time   CALCIUM 9.8 01/21/2016 1009   ALKPHOS 77 05/04/2015 0810   AST 20 05/04/2015 0810   ALT 16 05/04/2015 0810   BILITOT 0.5 05/04/2015 0810      12 lead EKG today: NSR, rate 72, PR interval 320 ms c/w First degree AV block.  No ischemic changes. Q wave in lead V1.  Compared to 12/2014 EKG the 1st deg AV block is new but the type 1 2nd degree block is gone.  IMPRESSION AND PLAN:  Acute episode of generalized weakness.  Apparently he was having transient low bp at that time. He was not having any CP, SOB, nausea, focal weakness, or dizziness. He had been experiencing symptoms of a viral syndrome for 1-2d prior.  Suspect things were related to this. EKG w/out worrisome findings today. Check CBC today. Reassured pt.  We'll see if this becomes a recurrent thing before any further w/u such as repeat echo or holter.  An After Visit Summary was printed and given to the patient.  FOLLOW UP: Return in about 4 weeks (around 06/02/2016) for routine chronic illness f/u.  Signed:  Crissie Sickles, MD           05/05/2016

## 2016-05-05 NOTE — Progress Notes (Signed)
Pre visit review using our clinic review tool, if applicable. No additional management support is needed unless otherwise documented below in the visit note. 

## 2016-05-31 ENCOUNTER — Ambulatory Visit (INDEPENDENT_AMBULATORY_CARE_PROVIDER_SITE_OTHER): Payer: Medicare Other | Admitting: Family Medicine

## 2016-05-31 ENCOUNTER — Encounter: Payer: Self-pay | Admitting: Family Medicine

## 2016-05-31 VITALS — BP 147/79 | HR 82 | Temp 97.7°F | Resp 16 | Ht 68.0 in | Wt 202.2 lb

## 2016-05-31 DIAGNOSIS — E119 Type 2 diabetes mellitus without complications: Secondary | ICD-10-CM

## 2016-05-31 DIAGNOSIS — N183 Chronic kidney disease, stage 3 unspecified: Secondary | ICD-10-CM

## 2016-05-31 DIAGNOSIS — E039 Hypothyroidism, unspecified: Secondary | ICD-10-CM | POA: Diagnosis not present

## 2016-05-31 DIAGNOSIS — E7800 Pure hypercholesterolemia, unspecified: Secondary | ICD-10-CM | POA: Diagnosis not present

## 2016-05-31 DIAGNOSIS — I10 Essential (primary) hypertension: Secondary | ICD-10-CM | POA: Diagnosis not present

## 2016-05-31 LAB — COMPREHENSIVE METABOLIC PANEL
ALT: 15 U/L (ref 0–53)
AST: 18 U/L (ref 0–37)
Albumin: 3.8 g/dL (ref 3.5–5.2)
Alkaline Phosphatase: 108 U/L (ref 39–117)
BUN: 33 mg/dL — ABNORMAL HIGH (ref 6–23)
CO2: 28 mEq/L (ref 19–32)
Calcium: 9.4 mg/dL (ref 8.4–10.5)
Chloride: 104 mEq/L (ref 96–112)
Creatinine, Ser: 2.19 mg/dL — ABNORMAL HIGH (ref 0.40–1.50)
GFR: 30.34 mL/min — ABNORMAL LOW (ref >60.00)
Glucose, Bld: 98 mg/dL (ref 70–99)
Potassium: 5.2 mEq/L — ABNORMAL HIGH (ref 3.5–5.1)
Sodium: 139 mEq/L (ref 135–145)
Total Bilirubin: 0.7 mg/dL (ref 0.2–1.2)
Total Protein: 6.4 g/dL (ref 6.0–8.3)

## 2016-05-31 LAB — LIPID PANEL
CHOL/HDL RATIO: 3
CHOLESTEROL: 106 mg/dL (ref 0–200)
HDL: 37.3 mg/dL — ABNORMAL LOW (ref >39.00)
LDL Cholesterol: 57 mg/dL (ref 0–99)
NonHDL: 69.07
TRIGLYCERIDES: 58 mg/dL (ref 0.0–149.0)
VLDL: 11.6 mg/dL (ref 0.0–40.0)

## 2016-05-31 LAB — HEMOGLOBIN A1C: HEMOGLOBIN A1C: 7 % — AB (ref 4.6–6.5)

## 2016-05-31 LAB — TSH: TSH: 5.39 u[IU]/mL — ABNORMAL HIGH (ref 0.35–4.50)

## 2016-05-31 NOTE — Progress Notes (Signed)
Pre visit review using our clinic review tool, if applicable. No additional management support is needed unless otherwise documented below in the visit note. 

## 2016-05-31 NOTE — Progress Notes (Signed)
OFFICE VISIT  05/31/2016   CC:  Chief Complaint  Patient presents with  . Follow-up    RCI, pt is fasting.    HPI:    Patient is a 83 y.o. Caucasian male who presents for 4 week f/u after having an isolated episode of symptomatic hypotension.  This was in the setting of a resolving viral illness.    No further episodes of the weakness or hypotension. Review of recent bps' shows avg 130/70, P 70s.  He is overdue for f/u chronic illnesses: HTN, HLD, Hypoth, CRI stage III. HLD: takes 1/2 of 20mg  lipitor qd b/c more than this causes too many side effects. Hypoth: takes this on empty stomach with other pills.  Most recent CHF clinic visit was 12/2015; Dr. Marigene Ehlers.  Pt states he was instructed to f/u there 1 yr. Has not been taking the lasix for the last week b/c he got tired of peeing a lot.  However, his frequent urination has not abated.  No dysuria.  Has some chronic urgency.     Past Medical History:  Diagnosis Date  . CAD (coronary artery disease)   . Carotid bruit    60-79% bilat In carot sten, stable over serial exams.  04/2015 stable 60-10% RICA, 93-23% LICA.  Repeat 1 yr.  . Cholelithiasis   . Chronic diastolic heart failure ([place])    CHF clinic  . Chronic renal insufficiency, stage III (moderate)    GFR 30s  . 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  Marland Kitchen COPD (chronic obstructive pulmonary disease) ([place])   . Diabetes mellitus without complication ([place])    08/5730 HbA1c 6.8%  . Diverticulosis   . GERD (gastroesophageal reflux disease)   . Hyperlipidemia   . Hypertension   . Hypothyroidism   . Lumbago   . Osteoarthritis   . Right hip pain    Trochanteric bursitis: Dr. Alvan Dame injected this 09/2015.  Hip x-rays normal at Dr. Aurea Graff office.  . Venous insufficiency of both lower extremities     Past Surgical History:  Procedure  Laterality Date  . APPENDECTOMY    . CORONARY ARTERY BYPASS GRAFT  2005   LIMA-LAD,SVG-PDA,SVG-D1,SVG-OM.  Myoview (9/08) showed EF63%,no scaror ischemia.  . TRANSTHORACIC ECHOCARDIOGRAM  01/29/2015   EF 55-60%, normal LV function and wall motion.  Mod tricusp regurg.    Outpatient Medications Prior to Visit  Medication Sig Dispense Refill  . aspirin 325 MG tablet Take 162.5 mg by mouth daily. TAKE 1/2 TAB     . atorvastatin (LIPITOR) 20 MG tablet Take 1 tablet (20 mg total) by mouth daily. (Patient taking differently: Take 10 mg by mouth daily. ) 90 tablet 1  . Cholecalciferol (VITAMIN D3) 1000 UNITS tablet Take 1,000 Units by mouth daily.      . Coenzyme Q10 200 MG TABS Take 1 tablet by mouth daily.   0  . Garlic 2025 MG CAPS Take 1 capsule by mouth daily.    [name] Oil 350 MG CAPS Take 1 capsule by mouth daily.    Marland Kitchen levothyroxine (SYNTHROID, LEVOTHROID) 100 MCG tablet Take 100 mcg by mouth daily.      . meloxicam (MOBIC) 7.5 MG tablet Take 7.5 mg by mouth daily.    Marland Kitchen omeprazole (PRILOSEC) 20 MG capsule Take 20 mg by mouth daily.     . furosemide (LASIX)  20 MG tablet Take 20 mg (1 tab) once daily alternating with 40 mg (2 tabs) once the next day. (Patient not taking: Reported on 05/31/2016) 90 tablet 6  . mupirocin ointment (BACTROBAN) 2 % Apply 1 application topically 2 (two) times daily.     No facility-administered medications prior to visit.     Allergies  Allergen Reactions  . Amlodipine     edema  . Benazepril Hcl     REACTION: elev K    ROS As per HPI  PE: Blood pressure (!) 147/79, pulse 82, temperature 97.7 F (36.5 C), temperature source Oral, resp. rate 16, height 5\' 8"  (1.727 m), weight 202 lb 4 oz (91.7 kg), SpO2 99 %. Gen: Alert, well appearing.  Patient is oriented to person, place, time, and situation. ENT: Nasal mucosa moist, with mild generalized swelling of turbinates.  Small area of scab in roof of R nare, removed this today with curette.  No active  bleeding. CV: RRR, no m/r/g.   LUNGS: CTA bilat, nonlabored resps, good aeration in all lung fields. EXT: no clubbing, cyanosis. 3-4+ pitting edema on both LL's up to patella  LABS:  Lab Results  Component Value Date   TSH 1.04 12/31/2013   Lab Results  Component Value Date   WBC 9.6 05/05/2016   HGB 12.4 (L) 05/05/2016   HCT 37.3 (L) 05/05/2016   MCV 87.8 05/05/2016   PLT 191.0 05/05/2016   Lab Results  Component Value Date   CREATININE 1.93 (H) 01/21/2016   BUN 30 (H) 01/21/2016   NA 140 01/21/2016   K 4.3 01/21/2016   CL 104 01/21/2016   CO2 29 01/21/2016   Lab Results  Component Value Date   ALT 16 05/04/2015   AST 20 05/04/2015   ALKPHOS 77 05/04/2015   BILITOT 0.5 05/04/2015   Lab Results  Component Value Date   CHOL 96 (L) 05/04/2015   Lab Results  Component Value Date   HDL 47 05/04/2015   Lab Results  Component Value Date   LDLCALC 39 05/04/2015   Lab Results  Component Value Date   TRIG 49 05/04/2015   Lab Results  Component Value Date   CHOLHDL 2.0 05/04/2015   Lab Results  Component Value Date   PSA 0.76 12/31/2013   PSA 0.68 10/09/2012   PSA 0.49 09/30/2009   Lab Results  Component Value Date   HGBA1C 6.8 (H) 04/30/2014    IMPRESSION AND PLAN:  1) Symptomatic hypotension: isolated episode, related to post-viral state. Home bp's normal since.  Reassured.  2) Chronic diast CHF: needs to get back on his lasix. Check lytes/cr today.  3) Hypothyroidism: check TSH today.  4) HLD: tolerating low dose lipitor.  Check CMET and lipid panel today.  5) DM 2, no complic (except possibly CRI). Last A1c was 2016.  Pt lost to f/u until recently. Recheck Hba1c today.  6) HTN: on no meds at this time. Home bps normal.  An After Visit Summary was printed and given to the patient.  FOLLOW UP: Return in about 4 months (around 09/28/2016) for routine chronic illness f/u.  Signed:  Crissie Sickles, MD           05/31/2016

## 2016-06-01 ENCOUNTER — Other Ambulatory Visit: Payer: Self-pay | Admitting: *Deleted

## 2016-06-01 DIAGNOSIS — E875 Hyperkalemia: Secondary | ICD-10-CM

## 2016-06-08 ENCOUNTER — Other Ambulatory Visit (INDEPENDENT_AMBULATORY_CARE_PROVIDER_SITE_OTHER): Payer: Medicare Other

## 2016-06-08 DIAGNOSIS — E875 Hyperkalemia: Secondary | ICD-10-CM

## 2016-06-08 LAB — BASIC METABOLIC PANEL
BUN: 41 mg/dL — ABNORMAL HIGH (ref 6–23)
CALCIUM: 9.4 mg/dL (ref 8.4–10.5)
CO2: 32 meq/L (ref 19–32)
CREATININE: 2.89 mg/dL — AB (ref 0.40–1.50)
Chloride: 101 mEq/L (ref 96–112)
GFR: 22.03 mL/min — AB (ref >60.00)
GLUCOSE: 107 mg/dL — AB (ref 70–99)
Potassium: 5 mEq/L (ref 3.5–5.1)
SODIUM: 138 meq/L (ref 135–145)

## 2016-06-26 ENCOUNTER — Encounter: Payer: Self-pay | Admitting: Family Medicine

## 2016-06-26 ENCOUNTER — Other Ambulatory Visit (INDEPENDENT_AMBULATORY_CARE_PROVIDER_SITE_OTHER): Payer: Medicare Other

## 2016-06-26 ENCOUNTER — Ambulatory Visit: Payer: Medicare Other | Admitting: Family Medicine

## 2016-06-26 ENCOUNTER — Other Ambulatory Visit: Payer: Self-pay | Admitting: *Deleted

## 2016-06-26 DIAGNOSIS — N183 Chronic kidney disease, stage 3 unspecified: Secondary | ICD-10-CM

## 2016-06-26 LAB — BASIC METABOLIC PANEL
BUN: 33 mg/dL — AB (ref 6–23)
CALCIUM: 9.6 mg/dL (ref 8.4–10.5)
CO2: 29 mEq/L (ref 19–32)
Chloride: 100 mEq/L (ref 96–112)
Creatinine, Ser: 2.45 mg/dL — ABNORMAL HIGH (ref 0.40–1.50)
GFR: 26.65 mL/min — ABNORMAL LOW (ref >60.00)
GLUCOSE: 108 mg/dL — AB (ref 70–99)
POTASSIUM: 5 meq/L (ref 3.5–5.1)
SODIUM: 137 meq/L (ref 135–145)

## 2016-09-28 ENCOUNTER — Ambulatory Visit: Payer: Medicare Other | Admitting: Family Medicine

## 2016-10-10 ENCOUNTER — Encounter: Payer: Self-pay | Admitting: Family Medicine

## 2016-10-10 ENCOUNTER — Ambulatory Visit (INDEPENDENT_AMBULATORY_CARE_PROVIDER_SITE_OTHER): Payer: Medicare Other | Admitting: Family Medicine

## 2016-10-10 VITALS — BP 138/75 | HR 63 | Temp 97.8°F | Resp 16 | Ht 68.0 in | Wt 202.5 lb

## 2016-10-10 DIAGNOSIS — E118 Type 2 diabetes mellitus with unspecified complications: Secondary | ICD-10-CM | POA: Diagnosis not present

## 2016-10-10 DIAGNOSIS — E039 Hypothyroidism, unspecified: Secondary | ICD-10-CM | POA: Diagnosis not present

## 2016-10-10 DIAGNOSIS — N184 Chronic kidney disease, stage 4 (severe): Secondary | ICD-10-CM | POA: Diagnosis not present

## 2016-10-10 DIAGNOSIS — R600 Localized edema: Secondary | ICD-10-CM | POA: Diagnosis not present

## 2016-10-10 DIAGNOSIS — I10 Essential (primary) hypertension: Secondary | ICD-10-CM

## 2016-10-10 DIAGNOSIS — I872 Venous insufficiency (chronic) (peripheral): Secondary | ICD-10-CM

## 2016-10-10 LAB — BASIC METABOLIC PANEL
BUN: 44 mg/dL — ABNORMAL HIGH (ref 6–23)
CHLORIDE: 103 meq/L (ref 96–112)
CO2: 28 mEq/L (ref 19–32)
CREATININE: 2.53 mg/dL — AB (ref 0.40–1.50)
Calcium: 9.7 mg/dL (ref 8.4–10.5)
GFR: 25.66 mL/min — ABNORMAL LOW (ref >60.00)
GLUCOSE: 116 mg/dL — AB (ref 70–99)
Potassium: 4.8 mEq/L (ref 3.5–5.1)
Sodium: 138 mEq/L (ref 135–145)

## 2016-10-10 LAB — MICROALBUMIN / CREATININE URINE RATIO
Creatinine,U: 110.2 mg/dL
MICROALB UR: 7.8 mg/dL — AB (ref 0.0–1.9)
Microalb Creat Ratio: 7.1 mg/g (ref 0.0–30.0)

## 2016-10-10 LAB — HEMOGLOBIN A1C: HEMOGLOBIN A1C: 7.2 % — AB (ref 4.6–6.5)

## 2016-10-10 LAB — TSH: TSH: 3.31 u[IU]/mL (ref 0.35–4.50)

## 2016-10-10 NOTE — Progress Notes (Signed)
OFFICE VISIT  10/10/2016   CC:  Chief Complaint  Patient presents with  . Follow-up    RCI, pt is fasting.      HPI:    Patient is a 83 y.o.  male who presents for 4 mo f/u diet controlled DM 2, CRI stage III/IV, HTN, hypothyroidism. Not elevating legs much, trying to eat low Na diet.  Weeping from R lower leg about 1 week now.  Has been taking lasix 20mg  qd alt with 40mg  qd.  DM 2:  No home gluc monitoring, not really a diabetic diet.  Denies polyuria or polydipsia. Denies burning, tingling, or numbness in feet.  HTN: home bp monitoring 130/60s, HR 60-70.  Compliant with meds.  No side effects.  HYpoth: takes levothyroxine qd on empty stomach with the rest of his meds--as he has always done.  Also, is having gradually increase in his chronic lower leg edema: chronic R>L.   Right lower leg with some weeping lately.  No pain.  He admits he doesn't elevated them much. Tries to eat low Na diet.  Wears compression stockings much of the time. Wt has been stable.  ROS: no CP, no SOB, no orthopnea or PND, no diaphoresis, no palpitations.  Past Medical History:  Diagnosis Date  . CAD (coronary artery disease)   . Carotid bruit    60-79% bilat In carot sten, stable over serial exams.  04/2015 stable 12-75% RICA, 17-00% LICA.  Repeat 1 yr.  . Cholelithiasis   . Chronic diastolic heart failure ([place])    CHF clinic  . Chronic renal insufficiency, stage III (moderate)    Stage III/IV--GFR 25-30 ml/min.  . 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  Marland Kitchen COPD (chronic obstructive pulmonary disease) ([place])   . Diabetes mellitus without complication ([place])    04/7492 HbA1c 6.8%  . Diverticulosis   . GERD (gastroesophageal reflux disease)   . Hyperlipidemia   . Hypertension   . Hypothyroidism   . Lumbago   . Osteoarthritis   . Right hip pain    Trochanteric bursitis: Dr. Alvan Dame injected this 09/2015.  Hip x-rays normal at Dr. Aurea Graff office.  . Venous insufficiency of both lower extremities     Past Surgical History:  Procedure Laterality Date  . APPENDECTOMY    . CORONARY ARTERY BYPASS GRAFT  2005   LIMA-LAD,SVG-PDA,SVG-D1,SVG-OM.  Myoview (9/08) showed EF63%,no scaror ischemia.  . TRANSTHORACIC ECHOCARDIOGRAM  01/29/2015   EF 55-60%, normal LV function and wall motion.  Mod tricusp regurg.    Outpatient Medications Prior to Visit  Medication Sig Dispense Refill  . aspirin 325 MG tablet Take 162.5 mg by mouth daily. TAKE 1/2 TAB     . atorvastatin (LIPITOR) 20 MG tablet Take 1 tablet (20 mg total) by mouth daily. (Patient taking differently: Take 10 mg by mouth daily. ) 90 tablet 1  . Cholecalciferol (VITAMIN D3) 1000 UNITS tablet Take 1,000 Units by mouth daily.      . Coenzyme Q10 200 MG TABS Take 1 tablet by mouth daily.   0  . furosemide (LASIX) 20 MG tablet Take 20 mg (1 tab) once daily alternating with 40 mg (2 tabs) once the next day. 90 tablet 6  . Garlic 4967 MG CAPS Take 1 capsule by mouth daily.    [name] Oil 350 MG CAPS Take 1  capsule by mouth daily.    Marland Kitchen levothyroxine (SYNTHROID, LEVOTHROID) 100 MCG tablet Take 100 mcg by mouth daily.      Marland Kitchen omeprazole (PRILOSEC) 20 MG capsule Take 20 mg by mouth daily.     . meloxicam (MOBIC) 7.5 MG tablet Take 7.5 mg by mouth daily.     No facility-administered medications prior to visit.     Allergies  Allergen Reactions  . Amlodipine     edema  . Benazepril Hcl     REACTION: elev K    ROS As per HPI  PE: Blood pressure 138/75, pulse 63, temperature 97.8 F (36.6 C), temperature source Oral, resp. rate 16, height 5\' 8"  (1.727 m), weight 202 lb 8 oz (91.9 kg), SpO2 99 %. Gen: Alert, well appearing.  Patient is oriented to person, place, time, and situation. AFFECT: pleasant, lucid thought and speech. CV: RRR, no m/r/g.   LUNGS: CTA bilat, nonlabored resps, good  aeration in all lung fields. EXT; 4+ pitting edema R LL, with minimal clear weeping fluid in pretibial region.  No erythema, ulceration, or tenderness. L LL 2+ edema, no rash or weeping or tenderness.   Foot exam - a few small calluses noted;  About 2+ pitting edema in ankles and dorsum of feet, no tenderness or skin or vascular lesions. Color and temperature is normal. Sensation is intact with monofilament testing in R plantar surface, absent in all areas on left foot. Peripheral pulses are palpable. Toenails are normal.  LABS:  Lab Results  Component Value Date   TSH 3.31 10/10/2016   Lab Results  Component Value Date   WBC 9.6 05/05/2016   HGB 12.4 (L) 05/05/2016   HCT 37.3 (L) 05/05/2016   MCV 87.8 05/05/2016   PLT 191.0 05/05/2016   Lab Results  Component Value Date   CREATININE 2.53 (H) 10/10/2016   BUN 44 (H) 10/10/2016   NA 138 10/10/2016   K 4.8 10/10/2016   CL 103 10/10/2016   CO2 28 10/10/2016   Lab Results  Component Value Date   ALT 15 05/31/2016   AST 18 05/31/2016   ALKPHOS 108 05/31/2016   BILITOT 0.7 05/31/2016   Lab Results  Component Value Date   CHOL 106 05/31/2016   Lab Results  Component Value Date   HDL 37.30 (L) 05/31/2016   Lab Results  Component Value Date   LDLCALC 57 05/31/2016   Lab Results  Component Value Date   TRIG 58.0 05/31/2016   Lab Results  Component Value Date   CHOLHDL 3 05/31/2016   Lab Results  Component Value Date   PSA 0.76 12/31/2013   PSA 0.68 10/09/2012   PSA 0.49 09/30/2009   Lab Results  Component Value Date   HGBA1C 7.2 (H) 10/10/2016   IMPRESSION AND PLAN:  1) DM 2; diet controlled. HbA1c today. Urine microalb/cr today. Continue to work on diabetic diet.  2) HTN; The current medical regimen is effective;  continue present plan and medications. Lytes/cr today.  3) Hypothyroidism: most recent TSH just a touch elevated but no dose adjustment was made. Recheck TSH today.  4) CRI stage III/IV:  avoid NSAIDs, focus on good hydration. Lytes/cr today.  5) Chronic LE venous insufficiency edema, worsening lately--with some weeping in R pretibial region: Emphasized low Na diet, daily elevation of legs above the level of his heart, wear compression stockings more regularly, increase lasix to 40mg  daily for today + 3 additional days. Recheck BMET first thing next week.  An After Visit Summary was printed and given to the patient.  FOLLOW UP: Return 6 d lab visit for BMET.  F/u with me for CPE in 1 mo, also needs appt with Maudie Mercury for AWV in 1 mo.  Signed:  Crissie Sickles, MD           10/10/2016

## 2016-10-10 NOTE — Patient Instructions (Signed)
Increase your furosemide (lasix) to 40 mg every day for the next four days (today, wed, thurs, frid). Return next Monday for lab visit.

## 2016-10-16 ENCOUNTER — Other Ambulatory Visit (INDEPENDENT_AMBULATORY_CARE_PROVIDER_SITE_OTHER): Payer: Medicare Other

## 2016-10-16 DIAGNOSIS — I10 Essential (primary) hypertension: Secondary | ICD-10-CM | POA: Diagnosis not present

## 2016-10-16 DIAGNOSIS — N183 Chronic kidney disease, stage 3 unspecified: Secondary | ICD-10-CM

## 2016-10-16 LAB — BASIC METABOLIC PANEL
BUN: 41 mg/dL — AB (ref 6–23)
CO2: 29 mEq/L (ref 19–32)
CREATININE: 2.67 mg/dL — AB (ref 0.40–1.50)
Calcium: 9.4 mg/dL (ref 8.4–10.5)
Chloride: 105 mEq/L (ref 96–112)
GFR: 24.12 mL/min — AB (ref >60.00)
GLUCOSE: 123 mg/dL — AB (ref 70–99)
Potassium: 4.7 mEq/L (ref 3.5–5.1)
Sodium: 140 mEq/L (ref 135–145)

## 2016-11-06 ENCOUNTER — Telehealth: Payer: Self-pay | Admitting: Family Medicine

## 2016-11-06 NOTE — Telephone Encounter (Signed)
Patient aware of instructions and has plans to come into office tomorrow.

## 2016-11-06 NOTE — Telephone Encounter (Signed)
OK for appt tomorrow as you have already done. However, pls ask him to take an extra 40mg  lasix pill today. Also, if he has compression hose he should be wearing them all day. He also needs to elevate his legs above the level of his heart for at least 30 minutes today.--thx

## 2016-11-06 NOTE — Telephone Encounter (Signed)
LMOM for pt to return call to discuss.

## 2016-11-06 NOTE — Telephone Encounter (Signed)
Patient called requesting appt for his leg swelling, patient has fluid coming out of his legs and I scheduled him for tomorrow but wanted Dr Anitra Lauth to be aware that patient symptoms are worsening in case this needed more immediate attention.  Please advise.

## 2016-11-07 ENCOUNTER — Ambulatory Visit (INDEPENDENT_AMBULATORY_CARE_PROVIDER_SITE_OTHER): Payer: Medicare Other | Admitting: Family Medicine

## 2016-11-07 ENCOUNTER — Encounter: Payer: Self-pay | Admitting: Family Medicine

## 2016-11-07 VITALS — BP 145/60 | HR 56 | Temp 97.4°F | Resp 16 | Ht 68.0 in | Wt 198.5 lb

## 2016-11-07 DIAGNOSIS — N183 Chronic kidney disease, stage 3 unspecified: Secondary | ICD-10-CM

## 2016-11-07 DIAGNOSIS — R600 Localized edema: Secondary | ICD-10-CM | POA: Diagnosis not present

## 2016-11-07 DIAGNOSIS — I872 Venous insufficiency (chronic) (peripheral): Secondary | ICD-10-CM

## 2016-11-07 MED ORDER — FUROSEMIDE 40 MG PO TABS: 40.0000 mg | ORAL_TABLET | Freq: Every day | ORAL | 3 refills | Status: DC

## 2016-11-07 NOTE — Progress Notes (Signed)
OFFICE VISIT  11/07/2016   CC:  Chief Complaint  Patient presents with  . Leg Swelling    lower bilateral   HPI:    Patient is a 83 y.o. Caucasian male who presents accompanied by his wife for worsening LE edema, weeping through skin has been noted recently. Yesterday after he called to let me know, I recommended he take an extra 40mg  lasix, wear compression stockings, and elevate legs. He has been taking 20mg  lasix once daily since last o/v about 3 wks ago.   The last week he has had worsened edema bilat again, with weeping.  Last 1-2 d says this is actually improved. He is not elevating his legs.  He wears compression hose and limits Na in diet.  ROS: no SOB, no orthopnea, no PND, no CP, no abd swelling or pain.  No leg pains or eryythema.  Past Medical History:  Diagnosis Date  . CAD (coronary artery disease)   . Carotid bruit    60-79% bilat In carot sten, stable over serial exams.  04/2015 stable 30-86% RICA, 57-84% LICA.  Repeat 1 yr.  . Cholelithiasis   . Chronic diastolic heart failure ([place])    CHF clinic  . Chronic renal insufficiency, stage III (moderate)    Stage III/IV--GFR 25-30 ml/min.  . 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  Marland Kitchen COPD (chronic obstructive pulmonary disease) ([place])   . Diabetes mellitus without complication ([place])    09/9627 HbA1c 6.8%  . Diverticulosis   . GERD (gastroesophageal reflux disease)   . Hyperlipidemia   . Hypertension   . Hypothyroidism   . Lumbago   . Osteoarthritis   . Right hip pain    Trochanteric bursitis: Dr. Alvan Dame injected this 09/2015.  Hip x-rays normal at Dr. Aurea Graff office.  . Venous insufficiency of both lower extremities     Past Surgical History:  Procedure Laterality Date  . APPENDECTOMY    . CORONARY ARTERY BYPASS GRAFT  2005   LIMA-LAD,SVG-PDA,SVG-D1,SVG-OM.  Myoview  (9/08) showed EF63%,no scaror ischemia.  . TRANSTHORACIC ECHOCARDIOGRAM  01/29/2015   EF 55-60%, normal LV function and wall motion.  Mod tricusp regurg.    Outpatient Medications Prior to Visit  Medication Sig Dispense Refill  . aspirin 325 MG tablet Take 162.5 mg by mouth daily. TAKE 1/2 TAB     . atorvastatin (LIPITOR) 20 MG tablet Take 1 tablet (20 mg total) by mouth daily. (Patient taking differently: Take 10 mg by mouth daily. ) 90 tablet 1  . Cholecalciferol (VITAMIN D3) 1000 UNITS tablet Take 1,000 Units by mouth daily.      . Coenzyme Q10 200 MG TABS Take 1 tablet by mouth daily.   0  . Garlic 5284 MG CAPS Take 1 capsule by mouth daily.    [name] Oil 350 MG CAPS Take 1 capsule by mouth daily.    Marland Kitchen levothyroxine (SYNTHROID, LEVOTHROID) 100 MCG tablet Take 100 mcg by mouth daily.      Marland Kitchen omeprazole (PRILOSEC) 20 MG capsule Take 20 mg by mouth daily.     . furosemide (LASIX) 20 MG tablet Take 20 mg (1 tab) once daily alternating with 40 mg (2 tabs) once the next day. 90 tablet 6   No facility-administered medications prior to visit.     Allergies  Allergen Reactions  . Amlodipine  edema  . Benazepril Hcl     REACTION: elev K    ROS As per HPI  PE: Blood pressure (!) 145/60, pulse (!) 56, temperature (!) 97.4 F (36.3 C), temperature source Oral, resp. rate 16, height 5\' 8"  (1.727 m), weight 198 lb 8 oz (90 kg), SpO2 99 %. Gen: Alert, well appearing.  Patient is oriented to person, place, time, and situation. AFFECT: pleasant, lucid thought and speech. CV: RRR, no m/r/g.   LUNGS: CTA bilat, nonlabored resps, good aeration in all lung fields. ABD: soft, NT/ND EXT: no clubbing or cyanosis.  4+ pitting edema in R LL from pretibial region down into ankle. 3+ pitting edema on L LL from pretibial region down into ankle.   Some clear weeping is noted on R side.  Some pinkish anter tib skin discoloration and fibrous thickening but no erythema or tenderness.  No ulcerations.   DP and PT pulses 2+ bilat.  LABS:  Lab Results  Component Value Date   TSH 3.31 10/10/2016   Lab Results  Component Value Date   WBC 9.6 05/05/2016   HGB 12.4 (L) 05/05/2016   HCT 37.3 (L) 05/05/2016   MCV 87.8 05/05/2016   PLT 191.0 05/05/2016   Lab Results  Component Value Date   CREATININE 2.67 (H) 10/16/2016   BUN 41 (H) 10/16/2016   NA 140 10/16/2016   K 4.7 10/16/2016   CL 105 10/16/2016   CO2 29 10/16/2016   Lab Results  Component Value Date   ALT 15 05/31/2016   AST 18 05/31/2016   ALKPHOS 108 05/31/2016   BILITOT 0.7 05/31/2016   Lab Results  Component Value Date   CHOL 106 05/31/2016   Lab Results  Component Value Date   HDL 37.30 (L) 05/31/2016   Lab Results  Component Value Date   LDLCALC 57 05/31/2016   Lab Results  Component Value Date   TRIG 58.0 05/31/2016   Lab Results  Component Value Date   CHOLHDL 3 05/31/2016   Lab Results  Component Value Date   PSA 0.76 12/31/2013   PSA 0.68 10/09/2012   PSA 0.49 09/30/2009   Lab Results  Component Value Date   HGBA1C 7.2 (H) 10/10/2016   IMPRESSION AND PLAN:  Bilat chronic LE venous insufficiency in the context of CRI stage III/IV. Recent worsening: he did not comply with my most recent lasix dosing of 40 mg qd (he has been taking 20 mg qd---there must have been a miscommunication). Plan take one 40mg  and one 20mg  lasix today and tomorrow, then take 40 mg lasix qd until I see him again for his CPE that is scheduled for 11/13/16.  Recheck BMET at that time. Encouraged elevation of legs, continue low Na diet and compression hose.  An After Visit Summary was printed and given to the patient.  FOLLOW UP: Return for keep f/u appt already set for 11/13/16.  Signed:  Crissie Sickles, MD           11/07/2016

## 2016-11-07 NOTE — Patient Instructions (Signed)
For the next 2 days (today and tomorrow), take one 40 mg furosemide and one 20 mg furosemide. Then decrease to one 40 mg furosemide daily until I see you again on 11/13/16.

## 2016-11-10 NOTE — Progress Notes (Signed)
Subjective:   Allen Holloway is a 83 y.o. male who presents for Medicare Annual/Subsequent preventive examination.  Review of Systems:  No ROS.  Medicare Wellness Visit. Additional risk factors are reflected in the social history.  Cardiac Risk Factors include: advanced age (>78men, >59 women);dyslipidemia;hypertension;male gender;obesity (BMI >30kg/m2)   Sleep patterns: Sleeps about 4-6 hours, feels rested. Naps occasionally. Up to void x 2. Home Safety/Smoke Alarms: Feels safe in home. Smoke alarms in place.  Living environment; residence and Firearm Safety: Lives with wife in 1 story home.  Seat Belt Safety/Bike Helmet: Wears seat belt.   Counseling:   Eye Exam-Last exam 10/2016, followed by [facility]-Dentures top, partial bottom.    Male:   CCS-Colonoscopy 12/29/2009, diverticula.      PSA-  Lab Results  Component Value Date   PSA 0.76 12/31/2013   PSA 0.68 10/09/2012   PSA 0.49 09/30/2009       Objective:    Vitals: BP 129/64 (BP Location: Left Arm, Patient Position: Sitting, Cuff Size: Normal)   Pulse (!) 57   Temp (!) 97.5 F (36.4 C) (Oral)   Resp 16   Ht 5' 7.5" (1.715 m)   Wt 200 lb 12 oz (91.1 kg)   SpO2 99%   BMI 30.98 kg/m   Body mass index is 30.98 kg/m.  Tobacco History  Smoking Status  . Former Smoker  Smokeless Tobacco  . Never Used    Comment: quit 20-67yrs ago.     Counseling given: Not Answered   Past Medical History:  Diagnosis Date  . CAD (coronary artery disease)   . Carotid bruit    60-79% bilat In carot sten, stable over serial exams.  04/2015 stable 10-17% RICA, 51-02% LICA.  Repeat 1 yr.  . Cholelithiasis   . Chronic diastolic heart failure ([place])    CHF clinic  . Chronic renal insufficiency, stage III (moderate)    Stage III/IV--GFR 25-30 ml/min.  . 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  Marland Kitchen COPD (chronic obstructive pulmonary disease) ([place])   . Diabetes mellitus without complication ([place])    08/8525 HbA1c 6.8%  . Diverticulosis   . GERD (gastroesophageal reflux disease)   . Hyperlipidemia   . Hypertension   . Hypothyroidism   . Lumbago   . Osteoarthritis   . Right hip pain    Trochanteric bursitis: Dr. Alvan Dame injected this 09/2015.  Hip x-rays normal at Dr. Aurea Graff office.  . Venous insufficiency of both lower extremities    Past Surgical History:  Procedure Laterality Date  . APPENDECTOMY    . CORONARY ARTERY BYPASS GRAFT  2005   LIMA-LAD,SVG-PDA,SVG-D1,SVG-OM.  Myoview (9/08) showed EF63%,no scaror ischemia.  . TRANSTHORACIC ECHOCARDIOGRAM  01/29/2015   EF 55-60%, normal LV function and wall motion.  Mod tricusp regurg.   Family History  Problem Relation Age of Onset  . Hyperlipidemia Unknown   . Hypertension Unknown   . Colon cancer Neg Hx    History  Sexual Activity  . Sexual activity: Not on file    Outpatient Encounter Prescriptions as of 11/13/2016  Medication Sig  . aspirin 325 MG tablet Take 162.5 mg by mouth daily. TAKE 1/2 TAB   . atorvastatin (LIPITOR) 20 MG tablet Take 1 tablet (20 mg total) by mouth daily. (Patient taking differently: Take 10 mg by mouth daily. )  .  Cholecalciferol (VITAMIN D3) 1000 UNITS tablet Take 1,000 Units by mouth daily.    . Coenzyme Q10 200 MG TABS Take 1 tablet by mouth daily.   . furosemide (LASIX) 40 MG tablet Take 1 tablet (40 mg total) by mouth daily.  . Garlic 7829 MG CAPS Take 1 capsule by mouth daily.  [name] Oil 350 MG CAPS Take 1 capsule by mouth daily.  Marland Kitchen levothyroxine (SYNTHROID, LEVOTHROID) 100 MCG tablet Take 100 mcg by mouth daily.    Marland Kitchen omeprazole (PRILOSEC) 20 MG capsule Take 20 mg by mouth daily.    No facility-administered encounter medications on file as of 11/13/2016.     Activities of Daily Living In your present state of health, do you have any difficulty performing the following  activities: 11/13/2016  Hearing? N  Vision? N  Difficulty concentrating or making decisions? N  Walking or climbing stairs? N  Dressing or bathing? N  Doing errands, shopping? N  Preparing Food and eating ? N  Using the Toilet? N  In the past six months, have you accidently leaked urine? N  Do you have problems with loss of bowel control? N  Managing your Medications? N  Managing your Finances? N  Housekeeping or managing your Housekeeping? N  Some recent data might be hidden    Patient Care Team: Tammi Sou, MD as PCP - General (Family Medicine) Larey Dresser, MD (Cardiology) Inda Castle, MD (Gastroenterology) Elsie Saas, MD as Consulting Physician (Orthopedic Surgery) Melrose Nakayama, MD as Consulting Physician (Cardiothoracic Surgery) Paralee Cancel, MD as Consulting Physician (Orthopedic Surgery)   Assessment:    Physical assessment deferred to PCP.  Exercise Activities and Dietary recommendations Current Exercise Habits: The patient does not participate in regular exercise at present (yard work), Exercise limited by: None identified   Diet (meal preparation, eat out, water intake, caffeinated beverages, dairy products, fruits and vegetables): Coffee and water.   Breakfast: Eggs, bacon, sausage, ham, toast, oatmeal, grits Lunch: chicken biscuit, sandwich Dinner: protein, vegetables.   Discussed heart healthy diet and activity.   Goals      Patient Stated   . <enter goal here> (pt-stated)          Maintain current health.       Fall Risk Fall Risk  11/13/2016  Falls in the past year? No   Depression Screen PHQ 2/9 Scores 11/13/2016  PHQ - 2 Score 0    Cognitive Function       Ad8 score reviewed for issues:  Issues making decisions: no  Less interest in hobbies / activities: no  Repeats questions, stories (family complaining): no  Trouble using ordinary gadgets (microwave, computer, phone): no  Forgets the month or year:   no  Mismanaging finances: no  Remembering appts: no  Daily problems with thinking and/or memory: no Ad8 score is=0     Immunization History  Administered Date(s) Administered  . Influenza Split 01/25/2011, 01/09/2012  . Influenza,inj,Quad PF,36+ Mos 01/09/2013, 12/31/2013  . Influenza-Unspecified 01/18/2015, 12/30/2015  . Pneumococcal Conjugate-13 07/29/2013  . Pneumococcal Polysaccharide-23 03/17/2009  . Td 06/14/2009  . Zoster Recombinat (Shingrix) 07/07/2016, 11/07/2016   Screening Tests Health Maintenance  Topic Date Due  . INFLUENZA VACCINE  11/22/2016  . HEMOGLOBIN A1C  04/11/2017  . FOOT EXAM  10/10/2017  . URINE MICROALBUMIN  10/10/2017  . OPHTHALMOLOGY EXAM  10/13/2017  . TETANUS/TDAP  06/15/2019  . PNA vac Low Risk Adult  Completed      Plan:  Continue doing brain stimulating activities (puzzles, reading, adult coloring books, staying active) to keep memory sharp.   I have personally reviewed and noted the following in the patient's chart:   . Medical and social history . Use of alcohol, tobacco or illicit drugs  . Current medications and supplements . Functional ability and status . Nutritional status . Physical activity . Advanced directives . List of other physicians . Hospitalizations, surgeries, and ER visits in previous 12 months . Vitals . Screenings to include cognitive, depression, and falls . Referrals and appointments  In addition, I have reviewed and discussed with patient certain preventive protocols, quality metrics, and best practice recommendations. A written personalized care plan for preventive services as well as general preventive health recommendations were provided to patient.     Gerilyn Nestle, RN  11/13/2016

## 2016-11-13 ENCOUNTER — Encounter: Payer: Self-pay | Admitting: Family Medicine

## 2016-11-13 ENCOUNTER — Ambulatory Visit (INDEPENDENT_AMBULATORY_CARE_PROVIDER_SITE_OTHER): Payer: Medicare Other | Admitting: Family Medicine

## 2016-11-13 VITALS — BP 129/64 | HR 57 | Temp 97.5°F | Resp 16 | Ht 67.5 in | Wt 200.8 lb

## 2016-11-13 DIAGNOSIS — Z0000 Encounter for general adult medical examination without abnormal findings: Secondary | ICD-10-CM

## 2016-11-13 DIAGNOSIS — R609 Edema, unspecified: Secondary | ICD-10-CM

## 2016-11-13 DIAGNOSIS — I872 Venous insufficiency (chronic) (peripheral): Secondary | ICD-10-CM | POA: Diagnosis not present

## 2016-11-13 DIAGNOSIS — N184 Chronic kidney disease, stage 4 (severe): Secondary | ICD-10-CM | POA: Diagnosis not present

## 2016-11-13 LAB — BASIC METABOLIC PANEL
BUN: 42 mg/dL — ABNORMAL HIGH (ref 6–23)
CALCIUM: 9.7 mg/dL (ref 8.4–10.5)
CO2: 28 meq/L (ref 19–32)
CREATININE: 2.84 mg/dL — AB (ref 0.40–1.50)
Chloride: 103 mEq/L (ref 96–112)
GFR: 22.45 mL/min — ABNORMAL LOW (ref >60.00)
GLUCOSE: 108 mg/dL — AB (ref 70–99)
Potassium: 4.7 mEq/L (ref 3.5–5.1)
Sodium: 139 mEq/L (ref 135–145)

## 2016-11-13 NOTE — Patient Instructions (Signed)
Continue doing brain stimulating activities (puzzles, reading, adult coloring books, staying active) to keep memory sharp.   Health Maintenance, Male A healthy lifestyle and preventive care is important for your health and wellness. Ask your health care provider about what schedule of regular examinations is right for you. What should I know about weight and diet? Eat a Healthy Diet  Eat plenty of vegetables, fruits, whole grains, low-fat dairy products, and lean protein.  Do not eat a lot of foods high in solid fats, added sugars, or salt.  Maintain a Healthy Weight Regular exercise can help you achieve or maintain a healthy weight. You should:  Do at least 150 minutes of exercise each week. The exercise should increase your heart rate and make you sweat (moderate-intensity exercise).  Do strength-training exercises at least twice a week.  Watch Your Levels of Cholesterol and Blood Lipids  Have your blood tested for lipids and cholesterol every 5 years starting at 81 years of age. If you are at high risk for heart disease, you should start having your blood tested when you are 81 years old. You may need to have your cholesterol levels checked more often if: ? Your lipid or cholesterol levels are high. ? You are older than 81 years of age. ? You are at high risk for heart disease.  What should I know about cancer screening? Many types of cancers can be detected early and may often be prevented. Lung Cancer  You should be screened every year for lung cancer if: ? You are a current smoker who has smoked for at least 30 years. ? You are a former smoker who has quit within the past 15 years.  Talk to your health care provider about your screening options, when you should start screening, and how often you should be screened.  Colorectal Cancer  Routine colorectal cancer screening usually begins at 81 years of age and should be repeated every 5-10 years until you are 81 years old. You  may need to be screened more often if early forms of precancerous polyps or small growths are found. Your health care provider may recommend screening at an earlier age if you have risk factors for colon cancer.  Your health care provider may recommend using home test kits to check for hidden blood in the stool.  A small camera at the end of a tube can be used to examine your colon (sigmoidoscopy or colonoscopy). This checks for the earliest forms of colorectal cancer.  Prostate and Testicular Cancer  Depending on your age and overall health, your health care provider may do certain tests to screen for prostate and testicular cancer.  Talk to your health care provider about any symptoms or concerns you have about testicular or prostate cancer.  Skin Cancer  Check your skin from head to toe regularly.  Tell your health care provider about any new moles or changes in moles, especially if: ? There is a change in a mole's size, shape, or color. ? You have a mole that is larger than a pencil eraser.  Always use sunscreen. Apply sunscreen liberally and repeat throughout the day.  Protect yourself by wearing long sleeves, pants, a wide-brimmed hat, and sunglasses when outside.  What should I know about heart disease, diabetes, and high blood pressure?  If you are 18-39 years of age, have your blood pressure checked every 3-5 years. If you are 40 years of age or older, have your blood pressure checked every year. You   should have your blood pressure measured twice-once when you are at a hospital or clinic, and once when you are not at a hospital or clinic. Record the average of the two measurements. To check your blood pressure when you are not at a hospital or clinic, you can use: ? An automated blood pressure machine at a pharmacy. ? A home blood pressure monitor.  Talk to your health care provider about your target blood pressure.  If you are between 45-79 years old, ask your health care  provider if you should take aspirin to prevent heart disease.  Have regular diabetes screenings by checking your fasting blood sugar level. ? If you are at a normal weight and have a low risk for diabetes, have this test once every three years after the age of 45. ? If you are overweight and have a high risk for diabetes, consider being tested at a younger age or more often.  A one-time screening for abdominal aortic aneurysm (AAA) by ultrasound is recommended for men aged 65-75 years who are current or former smokers. What should I know about preventing infection? Hepatitis B If you have a higher risk for hepatitis B, you should be screened for this virus. Talk with your health care provider to find out if you are at risk for hepatitis B infection. Hepatitis C Blood testing is recommended for:  Everyone born from 1945 through 1965.  Anyone with known risk factors for hepatitis C.  Sexually Transmitted Diseases (STDs)  You should be screened each year for STDs including gonorrhea and chlamydia if: ? You are sexually active and are younger than 81 years of age. ? You are older than 81 years of age and your health care provider tells you that you are at risk for this type of infection. ? Your sexual activity has changed since you were last screened and you are at an increased risk for chlamydia or gonorrhea. Ask your health care provider if you are at risk.  Talk with your health care provider about whether you are at high risk of being infected with HIV. Your health care provider may recommend a prescription medicine to help prevent HIV infection.  What else can I do?  Schedule regular health, dental, and eye exams.  Stay current with your vaccines (immunizations).  Do not use any tobacco products, such as cigarettes, chewing tobacco, and e-cigarettes. If you need help quitting, ask your health care provider.  Limit alcohol intake to no more than 2 drinks per day. One drink equals 12  ounces of beer, 5 ounces of wine, or 1 ounces of hard liquor.  Do not use street drugs.  Do not share needles.  Ask your health care provider for help if you need support or information about quitting drugs.  Tell your health care provider if you often feel depressed.  Tell your health care provider if you have ever been abused or do not feel safe at home. This information is not intended to replace advice given to you by your health care provider. Make sure you discuss any questions you have with your health care provider. Document Released: 10/07/2007 Document Revised: 12/08/2015 Document Reviewed: 01/12/2015 [employer]vier Interactive Patient Education  2018 [employer]  

## 2016-11-13 NOTE — Progress Notes (Signed)
Office Note 11/13/2016  CC:  Chief Complaint  Patient presents with  . Annual Exam    Pt is fasting.   . Medicare Wellness    HPI:  Allen Holloway is a 83 y.o. White male who is here for annual health maintenance exam. Pt here for f/u chronic renal insufficiency+ recent worsening of periph edema secondary to this dx + venous insufficiency.  Six days ago I increased his lasix to 40 mg qd. Edema is improved, no more weeping from R lower leg.  Compliant with lasix as rx'd.  Feeling well.  Eye exam: 1 mo ago at the [place]. Dentist: preventatives UTD.   Past Medical History:  Diagnosis Date  . CAD (coronary artery disease)   . Carotid bruit    60-79% bilat In carot sten, stable over serial exams.  04/2015 stable 63-84% RICA, 66-59% LICA.  Repeat 1 yr.  . Cholelithiasis   . Chronic diastolic heart failure ([place])    CHF clinic  . Chronic renal insufficiency, stage III (moderate)    Stage III/IV--GFR 25-30 ml/min.  . 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  Marland Kitchen COPD (chronic obstructive pulmonary disease) ([place])   . Diabetes mellitus without complication ([place])    12/3568 HbA1c 6.8%  . Diverticulosis   . GERD (gastroesophageal reflux disease)   . Hyperlipidemia   . Hypertension   . Hypothyroidism   . Lumbago   . Osteoarthritis   . Right hip pain    Trochanteric bursitis: Dr. Alvan Dame injected this 09/2015.  Hip x-rays normal at Dr. Aurea Graff office.  . Venous insufficiency of both lower extremities     Past Surgical History:  Procedure Laterality Date  . APPENDECTOMY    . CORONARY ARTERY BYPASS GRAFT  2005   LIMA-LAD,SVG-PDA,SVG-D1,SVG-OM.  Myoview (9/08) showed EF63%,no scaror ischemia.  . TRANSTHORACIC ECHOCARDIOGRAM  01/29/2015   EF 55-60%, normal LV function and wall motion.  Mod tricusp regurg.    Family History  Problem Relation Age  of Onset  . Hyperlipidemia Unknown   . Hypertension Unknown   . Colon cancer Neg Hx     Social History   Social History  . Marital status: Married    Spouse name: N/A  . Number of children: N/A  . Years of education: N/A   Occupational History  . retired    Social History Main Topics  . Smoking status: Former Research scientist (life sciences)  . Smokeless tobacco: Never Used     Comment: quit 20-2yrs ago.  . Alcohol use No  . Drug use: No  . Sexual activity: Not on file   Other Topics Concern  . Not on file   Social History Narrative   Married, has 2 stepsons.   Occupation: retired truckdriver x 50 yrs.   Never smoked more than a few years when a teenager, otherwise is a nonsmoker.   No alcohol.   Gets wellness at [facility] q 1 yr.        Daily caffeine use--4 cups daily.    Outpatient Medications Prior to Visit  Medication Sig Dispense Refill  . aspirin 325 MG tablet Take 162.5 mg by mouth daily. TAKE 1/2 TAB     . atorvastatin (LIPITOR) 20 MG tablet Take 1 tablet (20 mg total) by mouth daily. (Patient taking differently: Take 10 mg by mouth daily. ) 90 tablet 1  .  Cholecalciferol (VITAMIN D3) 1000 UNITS tablet Take 1,000 Units by mouth daily.      . Coenzyme Q10 200 MG TABS Take 1 tablet by mouth daily.   0  . furosemide (LASIX) 40 MG tablet Take 1 tablet (40 mg total) by mouth daily. 30 tablet 3  . Garlic 6962 MG CAPS Take 1 capsule by mouth daily.    [name] Oil 350 MG CAPS Take 1 capsule by mouth daily.    Marland Kitchen levothyroxine (SYNTHROID, LEVOTHROID) 100 MCG tablet Take 100 mcg by mouth daily.      Marland Kitchen omeprazole (PRILOSEC) 20 MG capsule Take 20 mg by mouth daily.      No facility-administered medications prior to visit.     Allergies  Allergen Reactions  . Amlodipine     edema  . Benazepril Hcl     REACTION: elev K    ROS Review of Systems  Constitutional: Negative for appetite change, chills, fatigue and fever.  HENT: Negative for congestion, dental problem, ear pain and sore throat.    Eyes: Negative for discharge, redness and visual disturbance.  Respiratory: Negative for cough, chest tightness, shortness of breath and wheezing.   Cardiovascular: Positive for leg swelling (chronic). Negative for chest pain and palpitations.  Gastrointestinal: Negative for abdominal pain, blood in stool, diarrhea, nausea and vomiting.  Genitourinary: Negative for difficulty urinating, dysuria, flank pain, frequency, hematuria and urgency.  Musculoskeletal: Negative for arthralgias, back pain, joint swelling, myalgias and neck stiffness.  Skin: Negative for pallor and rash.  Neurological: Negative for dizziness, speech difficulty, weakness and headaches.  Hematological: Negative for adenopathy. Does not bruise/bleed easily.  Psychiatric/Behavioral: Negative for confusion and sleep disturbance. The patient is not nervous/anxious.     PE; Blood pressure 129/64, pulse (!) 57, temperature (!) 97.5 F (36.4 C), temperature source Oral, resp. rate 16, height 5' 7.5" (1.715 m), weight 200 lb 12 oz (91.1 kg), SpO2 99 %. Body mass index is 30.98 kg/m.  Gen: Alert, well appearing.  Patient is oriented to person, place, time, and situation. AFFECT: pleasant, lucid thought and speech. ENT: Ears: EACs clear, normal epithelium.  TMs with good light reflex and landmarks bilaterally.  Eyes: no injection, icteris, swelling, or exudate.  EOMI, PERRLA. Nose: no drainage or turbinate edema/swelling.  No injection or focal lesion.  Mouth: lips without lesion/swelling.  Oral mucosa pink and moist.  Dentition intact and without obvious caries or gingival swelling.  Oropharynx without erythema, exudate, or swelling.  Neck: supple/nontender.  No LAD, mass, or TM.  Carotid pulses 2+ bilaterally, without bruits. CV: RRR, no m/r/g.   LUNGS: CTA bilat, nonlabored resps, good aeration in all lung fields. ABD: soft, NT, ND, BS normal.  No hepatospenomegaly or mass.  No bruits. EXT: no clubbing or cyanosis.  He has 3+  pitting edema in both LL's, no weeping.  No erythema or changes to suggest significant inflammation or infection.  Calf circumference at 12 cm below inf border of patella on R is 42 cm, on left is 39 cm. Musculoskeletal: no joint swelling, erythema, warmth, or tenderness.  ROM of all joints intact. Skin - no sores or suspicious lesions or rashes or color changes   Pertinent labs:  Lab Results  Component Value Date   TSH 3.31 10/10/2016   Lab Results  Component Value Date   WBC 9.6 05/05/2016   HGB 12.4 (L) 05/05/2016   HCT 37.3 (L) 05/05/2016   MCV 87.8 05/05/2016   PLT 191.0 05/05/2016   Lab  Results  Component Value Date   CREATININE 2.67 (H) 10/16/2016   BUN 41 (H) 10/16/2016   NA 140 10/16/2016   K 4.7 10/16/2016   CL 105 10/16/2016   CO2 29 10/16/2016   Lab Results  Component Value Date   ALT 15 05/31/2016   AST 18 05/31/2016   ALKPHOS 108 05/31/2016   BILITOT 0.7 05/31/2016   Lab Results  Component Value Date   CHOL 106 05/31/2016   Lab Results  Component Value Date   HDL 37.30 (L) 05/31/2016   Lab Results  Component Value Date   LDLCALC 57 05/31/2016   Lab Results  Component Value Date   TRIG 58.0 05/31/2016   Lab Results  Component Value Date   CHOLHDL 3 05/31/2016   Lab Results  Component Value Date   PSA 0.76 12/31/2013   PSA 0.68 10/09/2012   PSA 0.49 09/30/2009   Lab Results  Component Value Date   HGBA1C 7.2 (H) 10/10/2016   ASSESSMENT AND PLAN:   Health maintenance exam: Reviewed age and gender appropriate health maintenance issues (prudent diet, regular exercise, health risks of tobacco and excessive alcohol, use of seatbelts, fire alarms in home, use of sunscreen).  Also reviewed age and gender appropriate health screening as well as vaccine recommendations. Vaccines UTD, including shingrix #1. Labs: UTD, just need repeat BMET today to follow lytes/cr since increase in lasix recently.  Chronic LE venous insufficiency edema:  improved.  Continue all current treatmentmeasures, including lasix 40mg  qd.  An After Visit Summary was printed and given to the patient.  FOLLOW UP:  Return in about 10 weeks (around 01/22/2017) for routine chronic illness f/u.  Signed:  Crissie Sickles, MD           11/13/2016

## 2016-11-13 NOTE — Progress Notes (Signed)
AWV reviewed and agree.  Signed:  Crissie Sickles, MD           11/13/2016

## 2017-01-23 ENCOUNTER — Ambulatory Visit: Payer: Medicare Other | Admitting: Family Medicine

## 2017-01-24 ENCOUNTER — Ambulatory Visit (INDEPENDENT_AMBULATORY_CARE_PROVIDER_SITE_OTHER): Payer: Medicare Other | Admitting: Family Medicine

## 2017-01-24 ENCOUNTER — Encounter: Payer: Self-pay | Admitting: Family Medicine

## 2017-01-24 VITALS — BP 147/74 | HR 58 | Temp 97.3°F | Resp 16 | Ht 67.5 in | Wt 213.0 lb

## 2017-01-24 DIAGNOSIS — I872 Venous insufficiency (chronic) (peripheral): Secondary | ICD-10-CM

## 2017-01-24 DIAGNOSIS — R600 Localized edema: Secondary | ICD-10-CM | POA: Diagnosis not present

## 2017-01-24 DIAGNOSIS — I10 Essential (primary) hypertension: Secondary | ICD-10-CM

## 2017-01-24 DIAGNOSIS — E7800 Pure hypercholesterolemia, unspecified: Secondary | ICD-10-CM

## 2017-01-24 DIAGNOSIS — N184 Chronic kidney disease, stage 4 (severe): Secondary | ICD-10-CM

## 2017-01-24 DIAGNOSIS — E119 Type 2 diabetes mellitus without complications: Secondary | ICD-10-CM

## 2017-01-24 LAB — LIPID PANEL
Cholesterol: 104 mg/dL (ref 0–200)
HDL: 41.7 mg/dL (ref >39.00)
LDL CALC: 48 mg/dL (ref 0–99)
NonHDL: 62.73
TRIGLYCERIDES: 75 mg/dL (ref 0.0–149.0)
Total CHOL/HDL Ratio: 3
VLDL: 15 mg/dL (ref 0.0–40.0)

## 2017-01-24 LAB — BASIC METABOLIC PANEL
BUN: 26 mg/dL — ABNORMAL HIGH (ref 6–23)
CHLORIDE: 108 meq/L (ref 96–112)
CO2: 26 meq/L (ref 19–32)
Calcium: 9.5 mg/dL (ref 8.4–10.5)
Creatinine, Ser: 2.38 mg/dL — ABNORMAL HIGH (ref 0.40–1.50)
GFR: 27.52 mL/min — AB (ref >60.00)
GLUCOSE: 114 mg/dL — AB (ref 70–99)
POTASSIUM: 5.1 meq/L (ref 3.5–5.1)
SODIUM: 141 meq/L (ref 135–145)

## 2017-01-24 LAB — HEMOGLOBIN A1C: HEMOGLOBIN A1C: 7 % — AB (ref 4.6–6.5)

## 2017-01-24 MED ORDER — FUROSEMIDE 40 MG PO TABS: ORAL_TABLET | ORAL | 3 refills | Status: DC

## 2017-01-24 NOTE — Patient Instructions (Signed)
Increase your furosemide (lasix) to 1 and 1/2 tabs every day. Try to eat a low sodium diet.

## 2017-01-24 NOTE — Progress Notes (Signed)
OFFICE VISIT  01/24/2017   CC:  Chief Complaint  Patient presents with  . Follow-up    RCI, pt is fasting.     HPI:    Patient is a 83 y.o.  male who presents for f/u DM 2, HTN, chronic LE edema. Also has CRI with GFR in 25-30 range.  Admits he has not been eating low Na diet except doesn't add salt to food. Wearing compression stockings. LL's swollen more than last visit. No weeping. Burned a spot on back of R calf with engine of his lawn blower--but this is not painful and has dried up.  DM: no home glucose monitoring.  Not eating diabetic diet.  HTN: home monitoring 267T systolic, pt doesn't recall any other numbers.  ROS: minimal DOE, intermittent.  Chronic mild LL achiness.  No polydipsia.  No CP.    Past Medical History:  Diagnosis Date  . CAD (coronary artery disease)   . Carotid bruit    60-79% bilat In carot sten, stable over serial exams.  04/2015 stable 24-58% RICA, 09-98% LICA.  Repeat 1 yr.  . Cholelithiasis   . Chronic diastolic heart failure ([place])    CHF clinic  . Chronic renal insufficiency, stage III (moderate) ([facility])    Stage III/IV--GFR 25-30 ml/min.  . 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  Marland Kitchen COPD (chronic obstructive pulmonary disease) ([place])   . Diabetes mellitus without complication ([place])    06/3823 HbA1c 6.8%  . Diverticulosis   . GERD (gastroesophageal reflux disease)   . Hyperlipidemia   . Hypertension   . Hypothyroidism   . Lumbago   . Osteoarthritis   . Right hip pain    Trochanteric bursitis: Dr. Alvan Dame injected this 09/2015.  Hip x-rays normal at Dr. Aurea Graff office.  . Venous insufficiency of both lower extremities     Past Surgical History:  Procedure Laterality Date  . APPENDECTOMY    . CORONARY ARTERY BYPASS GRAFT  2005   LIMA-LAD,SVG-PDA,SVG-D1,SVG-OM.  Myoview (9/08) showed EF63%,no  scaror ischemia.  . TRANSTHORACIC ECHOCARDIOGRAM  01/29/2015   EF 55-60%, normal LV function and wall motion.  Mod tricusp regurg.    Outpatient Medications Prior to Visit  Medication Sig Dispense Refill  . aspirin 325 MG tablet Take 162.5 mg by mouth daily. TAKE 1/2 TAB     . atorvastatin (LIPITOR) 20 MG tablet Take 1 tablet (20 mg total) by mouth daily. (Patient taking differently: Take 10 mg by mouth daily. ) 90 tablet 1  . Cholecalciferol (VITAMIN D3) 1000 UNITS tablet Take 1,000 Units by mouth daily.      . Coenzyme Q10 200 MG TABS Take 1 tablet by mouth daily.   0  . Garlic 0539 MG CAPS Take 1 capsule by mouth daily.    [name] Oil 350 MG CAPS Take 1 capsule by mouth daily.    Marland Kitchen levothyroxine (SYNTHROID, LEVOTHROID) 100 MCG tablet Take 100 mcg by mouth daily.      Marland Kitchen omeprazole (PRILOSEC) 20 MG capsule Take 20 mg by mouth daily.     . furosemide (LASIX) 40 MG tablet Take 1 tablet (40 mg total) by mouth daily. 30 tablet 3   No facility-administered medications prior to visit.     Allergies  Allergen Reactions  . Amlodipine     edema  . Benazepril Hcl  REACTION: elev K    ROS As per HPI  PE: Blood pressure (!) 147/74, pulse (!) 58, temperature (!) 97.3 F (36.3 C), temperature source Oral, resp. rate 16, height 5' 7.5" (1.715 m), weight 213 lb (96.6 kg), SpO2 98 %. Gen: Alert, well appearing.  Patient is oriented to person, place, time, and situation. AFFECT: pleasant, lucid thought and speech. CV: RRR, no m/r/g.   LUNGS: CTA bilat, nonlabored resps, good aeration in all lung fields. EXT: 3+ LL pitting edema on L, 4+ LL pitting edema on R.  Mild pinkish discoloration R>L.  No erythema or weeping.  LABS:  Lab Results  Component Value Date   TSH 3.31 10/10/2016   Lab Results  Component Value Date   WBC 9.6 05/05/2016   HGB 12.4 (L) 05/05/2016   HCT 37.3 (L) 05/05/2016   MCV 87.8 05/05/2016   PLT 191.0 05/05/2016   Lab Results  Component Value Date    CREATININE 2.84 (H) 11/13/2016   BUN 42 (H) 11/13/2016   NA 139 11/13/2016   K 4.7 11/13/2016   CL 103 11/13/2016   CO2 28 11/13/2016   Lab Results  Component Value Date   ALT 15 05/31/2016   AST 18 05/31/2016   ALKPHOS 108 05/31/2016   BILITOT 0.7 05/31/2016   Lab Results  Component Value Date   CHOL 106 05/31/2016   Lab Results  Component Value Date   HDL 37.30 (L) 05/31/2016   Lab Results  Component Value Date   LDLCALC 57 05/31/2016   Lab Results  Component Value Date   TRIG 58.0 05/31/2016   Lab Results  Component Value Date   CHOLHDL 3 05/31/2016   Lab Results  Component Value Date   PSA 0.76 12/31/2013   PSA 0.68 10/09/2012   PSA 0.49 09/30/2009   Lab Results  Component Value Date   HGBA1C 7.2 (H) 10/10/2016    IMPRESSION AND PLAN:  1) DM 2:  Check A1c today. If A1c up again we will need to start low dose insulin.  2) HTN; The current medical regimen is effective;  continue present plan and medications. Lytes/cr today.  3) Chronic LE edema: combo of CRI and chronic LE venous insufficiency. Emphasized importance of low Na diet, gave pt handout on this. Continue compression hose. Increase lasix to 60mg  qd (1 and 1/2 of 40mg  tab). Lytes/cr today.  4) CRI stage 4, GFR 25-30 range:  Lytes/cr today.  An After Visit Summary was printed and given to the patient.  FOLLOW UP: Return in about 3 months (around 04/26/2017) for routine chronic illness f/u.  Signed:  Crissie Sickles, MD           01/24/2017

## 2017-01-30 ENCOUNTER — Other Ambulatory Visit (HOSPITAL_COMMUNITY): Payer: Self-pay | Admitting: *Deleted

## 2017-02-14 ENCOUNTER — Other Ambulatory Visit (HOSPITAL_COMMUNITY): Payer: Self-pay | Admitting: *Deleted

## 2017-02-14 MED ORDER — FUROSEMIDE 40 MG PO TABS: ORAL_TABLET | ORAL | 3 refills | Status: DC

## 2017-02-19 ENCOUNTER — Other Ambulatory Visit (HOSPITAL_COMMUNITY): Payer: Self-pay | Admitting: *Deleted

## 2017-02-23 ENCOUNTER — Other Ambulatory Visit (HOSPITAL_COMMUNITY): Payer: Self-pay | Admitting: *Deleted

## 2017-02-23 MED ORDER — FUROSEMIDE 40 MG PO TABS: 60.0000 mg | ORAL_TABLET | Freq: Every day | ORAL | 3 refills | Status: DC

## 2017-03-02 ENCOUNTER — Telehealth (HOSPITAL_COMMUNITY): Payer: Self-pay | Admitting: Cardiology

## 2017-03-02 ENCOUNTER — Encounter (HOSPITAL_COMMUNITY): Payer: Self-pay | Admitting: Cardiology

## 2017-03-02 ENCOUNTER — Ambulatory Visit (HOSPITAL_COMMUNITY)
Admission: RE | Admit: 2017-03-02 | Discharge: 2017-03-02 | Disposition: A | Discharge status: Home / Self Care | Payer: Medicare Other | Source: Ambulatory Visit | Attending: Cardiology | Admitting: Cardiology

## 2017-03-02 VITALS — BP 148/86 | HR 64 | Wt 202.1 lb

## 2017-03-02 DIAGNOSIS — Z7982 Long term (current) use of aspirin: Secondary | ICD-10-CM | POA: Insufficient documentation

## 2017-03-02 DIAGNOSIS — Z8249 Family history of ischemic heart disease and other diseases of the circulatory system: Secondary | ICD-10-CM | POA: Diagnosis not present

## 2017-03-02 DIAGNOSIS — I5032 Chronic diastolic (congestive) heart failure: Secondary | ICD-10-CM | POA: Diagnosis not present

## 2017-03-02 DIAGNOSIS — N183 Chronic kidney disease, stage 3 (moderate): Secondary | ICD-10-CM | POA: Diagnosis not present

## 2017-03-02 DIAGNOSIS — Z951 Presence of aortocoronary bypass graft: Secondary | ICD-10-CM | POA: Insufficient documentation

## 2017-03-02 DIAGNOSIS — I6523 Occlusion and stenosis of bilateral carotid arteries: Secondary | ICD-10-CM | POA: Diagnosis not present

## 2017-03-02 DIAGNOSIS — I110 Hypertensive heart disease with heart failure: Secondary | ICD-10-CM | POA: Insufficient documentation

## 2017-03-02 DIAGNOSIS — M1990 Unspecified osteoarthritis, unspecified site: Secondary | ICD-10-CM | POA: Insufficient documentation

## 2017-03-02 DIAGNOSIS — J449 Chronic obstructive pulmonary disease, unspecified: Secondary | ICD-10-CM | POA: Insufficient documentation

## 2017-03-02 DIAGNOSIS — E039 Hypothyroidism, unspecified: Secondary | ICD-10-CM | POA: Diagnosis not present

## 2017-03-02 DIAGNOSIS — I2510 Atherosclerotic heart disease of native coronary artery without angina pectoris: Secondary | ICD-10-CM | POA: Diagnosis not present

## 2017-03-02 DIAGNOSIS — E785 Hyperlipidemia, unspecified: Secondary | ICD-10-CM | POA: Insufficient documentation

## 2017-03-02 DIAGNOSIS — K219 Gastro-esophageal reflux disease without esophagitis: Secondary | ICD-10-CM | POA: Insufficient documentation

## 2017-03-02 DIAGNOSIS — I441 Atrioventricular block, second degree: Secondary | ICD-10-CM | POA: Insufficient documentation

## 2017-03-02 DIAGNOSIS — I350 Nonrheumatic aortic (valve) stenosis: Secondary | ICD-10-CM | POA: Diagnosis not present

## 2017-03-02 DIAGNOSIS — M791 Myalgia, unspecified site: Secondary | ICD-10-CM | POA: Diagnosis not present

## 2017-03-02 DIAGNOSIS — Z79899 Other long term (current) drug therapy: Secondary | ICD-10-CM | POA: Diagnosis not present

## 2017-03-02 DIAGNOSIS — Z87891 Personal history of nicotine dependence: Secondary | ICD-10-CM | POA: Diagnosis not present

## 2017-03-02 DIAGNOSIS — I6529 Occlusion and stenosis of unspecified carotid artery: Secondary | ICD-10-CM | POA: Diagnosis not present

## 2017-03-02 DIAGNOSIS — I739 Peripheral vascular disease, unspecified: Secondary | ICD-10-CM | POA: Diagnosis not present

## 2017-03-02 MED ORDER — FUROSEMIDE 80 MG PO TABS: 80.0000 mg | ORAL_TABLET | Freq: Every day | ORAL | 0 refills | Status: DC

## 2017-03-02 NOTE — Patient Instructions (Signed)
Start Furosemide 80 mg (1 tab) daily  Wear compression stocking in the day time  Your physician recommends that you return for lab work in: 10 days  Your physician has requested that you have a carotid duplex. This test is an ultrasound of the carotid arteries in your neck. It looks at blood flow through these arteries that supply the brain with blood. Allow one hour for this exam. There are no restrictions or special instructions.  Your physician has requested that you have a lower or upper extremity arterial duplex. This test is an ultrasound of the arteries in the legs or arms. It looks at arterial blood flow in the legs and arms. Allow one hour for Lower and Upper Arterial scans. There are no restrictions or special instructions  Your physician has requested that you have an echocardiogram. Echocardiography is a painless test that uses sound waves to create images of your heart. It provides your doctor with information about the size and shape of your heart and how well your heart's chambers and valves are working. This procedure takes approximately one hour. There are no restrictions for this procedure.  Your physician recommends that you schedule a follow-up appointment in: 6 weeks with Dr. Aundra Dubin

## 2017-03-02 NOTE — Telephone Encounter (Signed)
User: Cherie Dark A Date/time: 03/02/17 11:45 AM  Comment: Called pt and lmsg for him to CB to get schedule for an echo.  Context:  Outcome: Left Message  Phone number: [phone] Phone Type: Home Phone  Comm. type: Telephone Call type: Outgoing  Contact: [name] C Relation to patient: Self

## 2017-03-03 NOTE — Progress Notes (Signed)
Patient ID: Allen Holloway, male   DOB: 1926/08/16, 83 y.o.   MRN: [id number] PCP: Dr. Anitra Lauth Cardiology: Dr. Aundra Dubin  83 y.o. with history of CAD s/p CABG in 2005 with nonischemic myoview in 2008 presents for followup of CAD and CHF. Last echo in 10/16 showed EF 55-60%, mild MR, moderate TR.  He has had conduction abnormalities noted in the past and was taken off metoprolol.   He developed dyspnea and leg edema earlier this year, Lasix has been increased to 60 mg daily. Currently, he is stable.  No dyspnea walking on flat ground.  No dyspnea doing yardwork. No chest pain.  He has aching in his calves with ambulation, no clear distance reliably brings this on.  BP high in the office today, SBP< 140 when she checks at home.   ECG (personally reviewed): NSR, prominent U waves   Labs (6/11): K 5.3, creatinine 1.6, LDL 70, HDL 58, HCT 37, TSH normal  Labs (11/12): K 4.5, creatinine 1.9, LDL 77, HDL 60 Labs (5/14): BNP 296 Labs (9/14): K 4.5, creatinine 1.8 Labs (10/14): K 4.6, creatinine 1.8, BNP 203 Labs (11/14): LDL 86, HDL 43, K 4.8, creatinine 2.0 Labs (1/16): K 5, creatinine 2.0, LDL 143, HDL 41.5 Labs (1/17): LDL 39, HDL 47, K 5, creatinine 2.0 Labs (10/18): LDL 48, HDL 42, K 5.1, creatinine 2.38  Allergies (verified):  1) Lotensin (Benazepril Hcl)   Past Medical History:  1. COPD  2. Coronary artery disease: CABG 2005 with LIMA-LAD, SVG-PDA, SVG-D1, SVG-OM. Myoview (9/08) showed EF 63%, no scar or ischemia.  Echo (8/11): EF 55%, mild AI, mild MR, moderate TR, mildly dilated RV, PA systolic pressure 34 mmHg.  Echo (6/14): EF 50-55%, mild LVH, mild AS, mild MR, mildly dilated RV.  - Echo (10/16): EF 55-60%, mild MR, moderate TR.  3. Hyperlipidemia: Myalgias Lipitor and Crestor, 40 mg Lipitor.  4. Hypertension  5. Carotid bruit: Carotid dopplers (8/12) with 40-98% RICA, 11-91% LICA.  Carotid dopplers (4/14) with 60-79% bilateral ICA stenosis.  Carotid dopplers (10/14) with 60-79%  bilateral ICA stenosis. Carotid dopplers (1/16) with 60-79% BICA stenosis.  - Carotid doppler (1/17): 47-82% RICA, 95-62% LICA.  6. Cholelithiasis  7. CKD  8. Low back pain  9. Osteoarthritis  10. GERD  11. Hypothyroidism  12. ABIs normal 8/11 and in 3/15.  13. 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  14. Chronic diastolic CHF 15. Aortic stenosis: Mild on 6/14 echo, minimal on 10/16 echo.   Family History:  Family History High cholesterol  Family History Hypertension   Social History:  Retired  Married  Former Smoker   ROS: All systems reviewed and negative except as per HPI.   Current Outpatient Medications  Medication Sig Dispense Refill  . aspirin 325 MG tablet Take 162.5 mg by mouth daily. TAKE 1/2 TAB     . atorvastatin (LIPITOR) 20 MG tablet Take 1 tablet (20 mg total) by mouth daily. (Patient taking differently: Take 10 mg by mouth daily. ) 90 tablet 1  . Cholecalciferol (VITAMIN D3) 1000 UNITS tablet Take 1,000 Units by mouth daily.      . Coenzyme Q10 200 MG TABS Take 1 tablet by mouth daily.   0  . furosemide (LASIX) 40 MG tablet Take 1.5 tablets (60 mg total) by mouth daily. 1 and 1/2 tabs po qd 45 tablet 3  . Garlic 1308 MG CAPS Take 1 capsule by mouth daily.    Marland Kitchen  Krill Oil 350 MG CAPS Take 1 capsule by mouth daily.    Marland Kitchen levothyroxine (SYNTHROID, LEVOTHROID) 100 MCG tablet Take 100 mcg by mouth daily.      Marland Kitchen omeprazole (PRILOSEC) 20 MG capsule Take 20 mg by mouth daily.     . furosemide (LASIX) 80 MG tablet Take 1 tablet (80 mg total) daily by mouth. 90 tablet 0   No current facility-administered medications for this encounter.     BP (!) 148/86   Pulse 64   Wt 202 lb 1.9 oz (91.7 kg)   SpO2 93%   BMI 31.19 kg/m  General: NAD Neck: JVP 8-9 cm, no thyromegaly or thyroid nodule.  Lungs: Clear to auscultation bilaterally with normal respiratory effort. CV: Nondisplaced PMI.  Heart regular S1/S2, no S3/S4,  no murmur. 1+ edema 1/2 to knees bilaterally.  No carotid bruit.  Unable to palpate pedal pulses.  Abdomen: Soft, nontender, no hepatosplenomegaly, no distention.  Skin: Intact without lesions or rashes.  Neurologic: Alert and oriented x 3.  Psych: Normal affect. Extremities: No clubbing or cyanosis.  HEENT: Normal.   Assessment/Plan: 1. CAD: Stable without chest pain.  Continue ASA and statin. 2. Chronic diastolic CHF:  He appears to be volume overloaded on exam.   - Increase Lasix to 80 mg daily with BMET in 10 days.   - I will arrange for echo.  3. Hyperlipidemia: Myalgias with Crestor, pravastatin, and atorvastatin 40 daily.  He is able to tolerate atorvastatin 10 daily.  Excellent lipids in 10/18.    4. Carotid stenosis: Repeat carotids due.  5. 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  No symptoms of bradycardia.  He is off beta blockers.  He is in NSR today.   6. CKD: Stage III.  Repeat BMET after increasing Lasix.    7. PAD: Difficult to palpate pedal pulses and symptoms that may be consistent with claudication.  - I will arrange for peripheral arterial doppler evaluation.   8. Aortic stenosis: Repeat echo as above.   Followup in 6 wks.  Loralie Champagne 03/03/2017

## 2017-03-08 ENCOUNTER — Other Ambulatory Visit: Payer: Self-pay

## 2017-03-08 ENCOUNTER — Ambulatory Visit (HOSPITAL_COMMUNITY): Payer: Medicare Other | Attending: Cardiology

## 2017-03-08 DIAGNOSIS — I872 Venous insufficiency (chronic) (peripheral): Secondary | ICD-10-CM | POA: Diagnosis not present

## 2017-03-08 DIAGNOSIS — I6529 Occlusion and stenosis of unspecified carotid artery: Secondary | ICD-10-CM | POA: Insufficient documentation

## 2017-03-08 DIAGNOSIS — J449 Chronic obstructive pulmonary disease, unspecified: Secondary | ICD-10-CM | POA: Diagnosis not present

## 2017-03-08 DIAGNOSIS — E785 Hyperlipidemia, unspecified: Secondary | ICD-10-CM | POA: Insufficient documentation

## 2017-03-08 DIAGNOSIS — R609 Edema, unspecified: Secondary | ICD-10-CM | POA: Insufficient documentation

## 2017-03-08 DIAGNOSIS — I739 Peripheral vascular disease, unspecified: Secondary | ICD-10-CM | POA: Insufficient documentation

## 2017-03-08 DIAGNOSIS — Z87891 Personal history of nicotine dependence: Secondary | ICD-10-CM | POA: Diagnosis not present

## 2017-03-08 DIAGNOSIS — I081 Rheumatic disorders of both mitral and tricuspid valves: Secondary | ICD-10-CM | POA: Diagnosis not present

## 2017-03-08 DIAGNOSIS — N183 Chronic kidney disease, stage 3 (moderate): Secondary | ICD-10-CM | POA: Insufficient documentation

## 2017-03-08 DIAGNOSIS — I5032 Chronic diastolic (congestive) heart failure: Secondary | ICD-10-CM

## 2017-03-08 DIAGNOSIS — I10 Essential (primary) hypertension: Secondary | ICD-10-CM | POA: Diagnosis not present

## 2017-03-12 ENCOUNTER — Ambulatory Visit (HOSPITAL_COMMUNITY)
Admission: RE | Admit: 2017-03-12 | Discharge: 2017-03-12 | Disposition: A | Discharge status: Home / Self Care | Payer: Medicare Other | Source: Ambulatory Visit | Attending: Cardiology | Admitting: Cardiology

## 2017-03-12 DIAGNOSIS — I5032 Chronic diastolic (congestive) heart failure: Secondary | ICD-10-CM | POA: Diagnosis present

## 2017-03-12 LAB — BASIC METABOLIC PANEL
Anion gap: 8 (ref 5–15)
BUN: 48 mg/dL — AB (ref 6–20)
CALCIUM: 9.5 mg/dL (ref 8.9–10.3)
CO2: 28 mmol/L (ref 22–32)
CREATININE: 3.06 mg/dL — AB (ref 0.61–1.24)
Chloride: 102 mmol/L (ref 101–111)
GFR calc non Af Amer: 17 mL/min — ABNORMAL LOW (ref >60)
GFR, EST AFRICAN AMERICAN: 19 mL/min — AB (ref >60)
Glucose, Bld: 120 mg/dL — ABNORMAL HIGH (ref 65–99)
Potassium: 4.1 mmol/L (ref 3.5–5.1)
SODIUM: 138 mmol/L (ref 135–145)

## 2017-03-13 ENCOUNTER — Telehealth (HOSPITAL_COMMUNITY): Payer: Self-pay

## 2017-03-13 NOTE — Telephone Encounter (Signed)
Notes recorded by Shirley Muscat, RN on 03/13/2017 at 1:57 PM EST Pt aware of results and agreeable to med changes   ------  Notes recorded by Larey Dresser, MD on 03/13/2017 at 12:15 PM EST Creatinine higher, hold Lasix for 2 days then decrease to 60 mg daily

## 2017-03-20 ENCOUNTER — Other Ambulatory Visit (HOSPITAL_COMMUNITY): Payer: Self-pay

## 2017-03-20 MED ORDER — FUROSEMIDE 40 MG PO TABS: 60.0000 mg | ORAL_TABLET | Freq: Every day | ORAL | 3 refills | Status: DC

## 2017-03-25 ENCOUNTER — Encounter: Payer: Self-pay | Admitting: Family Medicine

## 2017-04-04 DIAGNOSIS — M79604 Pain in right leg: Secondary | ICD-10-CM | POA: Diagnosis not present

## 2017-04-04 DIAGNOSIS — M7061 Trochanteric bursitis, right hip: Secondary | ICD-10-CM | POA: Diagnosis not present

## 2017-04-04 DIAGNOSIS — M25551 Pain in right hip: Secondary | ICD-10-CM | POA: Diagnosis not present

## 2017-04-04 DIAGNOSIS — M5136 Other intervertebral disc degeneration, lumbar region: Secondary | ICD-10-CM | POA: Diagnosis not present

## 2017-04-11 ENCOUNTER — Encounter (HOSPITAL_COMMUNITY): Payer: Medicare Other

## 2017-04-13 ENCOUNTER — Ambulatory Visit (HOSPITAL_COMMUNITY)
Admission: RE | Admit: 2017-04-13 | Discharge: 2017-04-13 | Disposition: A | Discharge status: Home / Self Care | Payer: Medicare Other | Source: Ambulatory Visit | Attending: Cardiovascular Disease | Admitting: Cardiovascular Disease

## 2017-04-13 ENCOUNTER — Ambulatory Visit (HOSPITAL_BASED_OUTPATIENT_CLINIC_OR_DEPARTMENT_OTHER)
Admission: RE | Admit: 2017-04-13 | Discharge: 2017-04-13 | Disposition: A | Discharge status: Home / Self Care | Payer: Medicare Other | Source: Ambulatory Visit | Attending: Cardiology | Admitting: Cardiology

## 2017-04-13 DIAGNOSIS — I6529 Occlusion and stenosis of unspecified carotid artery: Secondary | ICD-10-CM

## 2017-04-13 DIAGNOSIS — E785 Hyperlipidemia, unspecified: Secondary | ICD-10-CM | POA: Diagnosis not present

## 2017-04-13 DIAGNOSIS — I10 Essential (primary) hypertension: Secondary | ICD-10-CM | POA: Insufficient documentation

## 2017-04-13 DIAGNOSIS — I2510 Atherosclerotic heart disease of native coronary artery without angina pectoris: Secondary | ICD-10-CM | POA: Diagnosis not present

## 2017-04-13 DIAGNOSIS — I739 Peripheral vascular disease, unspecified: Secondary | ICD-10-CM

## 2017-04-13 DIAGNOSIS — Z87891 Personal history of nicotine dependence: Secondary | ICD-10-CM | POA: Diagnosis not present

## 2017-04-13 DIAGNOSIS — I6523 Occlusion and stenosis of bilateral carotid arteries: Secondary | ICD-10-CM | POA: Diagnosis not present

## 2017-04-23 ENCOUNTER — Other Ambulatory Visit (HOSPITAL_COMMUNITY): Payer: Self-pay

## 2017-04-23 DIAGNOSIS — I6529 Occlusion and stenosis of unspecified carotid artery: Secondary | ICD-10-CM

## 2017-04-26 ENCOUNTER — Encounter: Payer: Self-pay | Admitting: Family Medicine

## 2017-04-26 ENCOUNTER — Ambulatory Visit (INDEPENDENT_AMBULATORY_CARE_PROVIDER_SITE_OTHER): Payer: Medicare Other | Admitting: Family Medicine

## 2017-04-26 VITALS — BP 131/76 | HR 77 | Temp 99.0°F | Resp 16 | Ht 67.5 in | Wt 197.2 lb

## 2017-04-26 DIAGNOSIS — I10 Essential (primary) hypertension: Secondary | ICD-10-CM

## 2017-04-26 DIAGNOSIS — E119 Type 2 diabetes mellitus without complications: Secondary | ICD-10-CM

## 2017-04-26 DIAGNOSIS — N183 Chronic kidney disease, stage 3 unspecified: Secondary | ICD-10-CM

## 2017-04-26 LAB — HEMOGLOBIN A1C: HEMOGLOBIN A1C: 6.9 % — AB (ref 4.6–6.5)

## 2017-04-26 LAB — BASIC METABOLIC PANEL
BUN: 49 mg/dL — ABNORMAL HIGH (ref 6–23)
CALCIUM: 9.7 mg/dL (ref 8.4–10.5)
CO2: 29 mEq/L (ref 19–32)
Chloride: 100 mEq/L (ref 96–112)
Creatinine, Ser: 3.34 mg/dL — ABNORMAL HIGH (ref 0.40–1.50)
GFR: 18.6 mL/min — AB (ref >60.00)
GLUCOSE: 109 mg/dL — AB (ref 70–99)
POTASSIUM: 4.5 meq/L (ref 3.5–5.1)
SODIUM: 138 meq/L (ref 135–145)

## 2017-04-26 NOTE — Progress Notes (Signed)
OFFICE VISIT  04/26/2017   CC:  Chief Complaint  Patient presents with  . Follow-up    RCI, pt is fasting.    HPI:    Patient is a 84 y.o. Caucasian male who presents for 3 mo f/u DM 2, HTN, CRI stage III. 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  ECHO 02/2017: LVEF/LV wall motion normal, mod tricusp regurg and mild pulm HTN. CHF MD's have been titrating lasix.  Most recent Cr check after increasing lasix to 80mg  qd was elevated, so his dose was held x 2 days and daily dose decreased to 60mg  qd---repeat BMET due today.  Feeling well.  No acute complaints.  DM : no home glucose monitoring.  Diet ok---a bit of noncompliance with the holidays. Compliant with med.  HTN: occ bp check consistently 130s/70s. Compliant with med.  CRI: avoiding NSAIDs. Trying to hydrate well w/out overdoing it.  He recently got a steroid injection in R hip about 1 mo ago.  Past Medical History:  Diagnosis Date  . CAD (coronary artery disease)   . Carotid bruit    60-79% bilat In carot sten, stable over serial exams.  04/2015 stable 60-73% RICA, 71-06% LICA.  Repeat 1 yr.  . Cholelithiasis   . Chronic diastolic heart failure ([place])    CHF clinic  . Chronic renal insufficiency, stage III (moderate) (HCC)    Stage III/IV--GFR 25-30 ml/min.  . 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  Avoid BB/verap/dilt  . COPD (chronic obstructive pulmonary disease) ([place])   . Diabetes mellitus without complication ([place])    05/6946 HbA1c 6.8%  . Diverticulosis   . GERD (gastroesophageal reflux disease)   . Hyperlipidemia   . Hypertension   . Hypothyroidism   . Lumbago   . Osteoarthritis   . Right hip pain    Trochanteric bursitis: Dr. Alvan Dame injected this 09/2015.  Hip x-rays normal at Dr. Aurea Graff office.  . Venous  insufficiency of both lower extremities     Past Surgical History:  Procedure Laterality Date  . APPENDECTOMY    . CORONARY ARTERY BYPASS GRAFT  2005   LIMA-LAD,SVG-PDA,SVG-D1,SVG-OM.  Myoview (9/08) showed EF63%,no scaror ischemia.  . TRANSTHORACIC ECHOCARDIOGRAM  01/29/2015; 03/08/17   2016 EF 55-60%, normal LV function and wall motion.  Mod tricusp regurg.  2018 EF 55-60%, normal wall motion, mod tricusp regurg.  Pulmonary arteries: Systolic pressure was moderately increased (secondary to DD).    Outpatient Medications Prior to Visit  Medication Sig Dispense Refill  . aspirin 325 MG tablet Take 162.5 mg by mouth daily. TAKE 1/2 TAB     . atorvastatin (LIPITOR) 20 MG tablet Take 1 tablet (20 mg total) by mouth daily. (Patient taking differently: Take 10 mg by mouth daily. ) 90 tablet 1  . Cholecalciferol (VITAMIN D3) 1000 UNITS tablet Take 1,000 Units by mouth daily.      . Coenzyme Q10 200 MG TABS Take 1 tablet by mouth daily.   0  . furosemide (LASIX) 40 MG tablet Take 1.5 tablets (60 mg total) by mouth daily. 1 and 1/2 tabs po qd 45 tablet 3  . Garlic 5462 MG CAPS Take 1 capsule by mouth daily.    [name] Oil 350 MG CAPS Take 1 capsule by mouth daily.    Marland Kitchen  levothyroxine (SYNTHROID, LEVOTHROID) 100 MCG tablet Take 100 mcg by mouth daily.      Marland Kitchen omeprazole (PRILOSEC) 20 MG capsule Take 20 mg by mouth daily.      No facility-administered medications prior to visit.     Allergies  Allergen Reactions  . Amlodipine     edema  . Benazepril Hcl     REACTION: elev K    ROS As per HPI  PE: Blood pressure 131/76, pulse 77, temperature 99 F (37.2 C), temperature source Temporal, resp. rate 16, height 5' 7.5" (1.715 m), weight 197 lb 4 oz (89.5 kg), SpO2 99 %. Gen: Alert, well appearing.  Patient is oriented to person, place, time, and situation. AFFECT: pleasant, lucid thought and speech. No further exam today.  LABS:  Lab Results  Component Value Date   TSH 3.31 10/10/2016    Lab Results  Component Value Date   WBC 9.6 05/05/2016   HGB 12.4 (L) 05/05/2016   HCT 37.3 (L) 05/05/2016   MCV 87.8 05/05/2016   PLT 191.0 05/05/2016   Lab Results  Component Value Date   CREATININE 3.06 (H) 03/12/2017   BUN 48 (H) 03/12/2017   NA 138 03/12/2017   K 4.1 03/12/2017   CL 102 03/12/2017   CO2 28 03/12/2017   Lab Results  Component Value Date   ALT 15 05/31/2016   AST 18 05/31/2016   ALKPHOS 108 05/31/2016   BILITOT 0.7 05/31/2016   Lab Results  Component Value Date   CHOL 104 01/24/2017   Lab Results  Component Value Date   HDL 41.70 01/24/2017   Lab Results  Component Value Date   LDLCALC 48 01/24/2017   Lab Results  Component Value Date   TRIG 75.0 01/24/2017   Lab Results  Component Value Date   CHOLHDL 3 01/24/2017   Lab Results  Component Value Date   PSA 0.76 12/31/2013   PSA 0.68 10/09/2012   PSA 0.49 09/30/2009   Lab Results  Component Value Date   HGBA1C 7.0 (H) 01/24/2017   IMPRESSION AND PLAN:  1) DM 2:  Hba1c today. All other routine diabetic monitoring is UTD. Vaccines UTD.  2) HTN: The current medical regimen is effective;  continue present plan and medications. BMET today.  3) CRI stage 3: recent worsening after diuretic was increased at CHF clinic.  Dose was dropped back down after this, now due for recheck BMET. Avoid NSAIDs.  Hydrate adequately.  An After Visit Summary was printed and given to the patient.   FOLLOW UP: Return in about 3 months (around 07/25/2017) for annual CPE (fasting)+ schedule AWV with KIM.  Signed:  Crissie Sickles, MD           04/26/2017

## 2017-04-30 ENCOUNTER — Other Ambulatory Visit: Payer: Self-pay | Admitting: Family Medicine

## 2017-04-30 DIAGNOSIS — N184 Chronic kidney disease, stage 4 (severe): Secondary | ICD-10-CM

## 2017-05-01 ENCOUNTER — Ambulatory Visit (HOSPITAL_COMMUNITY)
Admission: RE | Admit: 2017-05-01 | Discharge: 2017-05-01 | Disposition: A | Discharge status: Home / Self Care | Payer: Medicare Other | Source: Ambulatory Visit | Attending: Cardiology | Admitting: Cardiology

## 2017-05-01 ENCOUNTER — Encounter (HOSPITAL_COMMUNITY): Payer: Self-pay | Admitting: Cardiology

## 2017-05-01 ENCOUNTER — Other Ambulatory Visit: Payer: Self-pay

## 2017-05-01 VITALS — BP 135/66 | HR 68 | Wt 201.5 lb

## 2017-05-01 DIAGNOSIS — I739 Peripheral vascular disease, unspecified: Secondary | ICD-10-CM | POA: Diagnosis not present

## 2017-05-01 DIAGNOSIS — I6529 Occlusion and stenosis of unspecified carotid artery: Secondary | ICD-10-CM

## 2017-05-01 DIAGNOSIS — I6521 Occlusion and stenosis of right carotid artery: Secondary | ICD-10-CM | POA: Insufficient documentation

## 2017-05-01 DIAGNOSIS — M791 Myalgia, unspecified site: Secondary | ICD-10-CM | POA: Insufficient documentation

## 2017-05-01 DIAGNOSIS — Z79899 Other long term (current) drug therapy: Secondary | ICD-10-CM | POA: Insufficient documentation

## 2017-05-01 DIAGNOSIS — M1990 Unspecified osteoarthritis, unspecified site: Secondary | ICD-10-CM | POA: Insufficient documentation

## 2017-05-01 DIAGNOSIS — Z7982 Long term (current) use of aspirin: Secondary | ICD-10-CM | POA: Insufficient documentation

## 2017-05-01 DIAGNOSIS — J449 Chronic obstructive pulmonary disease, unspecified: Secondary | ICD-10-CM | POA: Insufficient documentation

## 2017-05-01 DIAGNOSIS — N184 Chronic kidney disease, stage 4 (severe): Secondary | ICD-10-CM | POA: Diagnosis not present

## 2017-05-01 DIAGNOSIS — Z87891 Personal history of nicotine dependence: Secondary | ICD-10-CM | POA: Insufficient documentation

## 2017-05-01 DIAGNOSIS — Z79890 Hormone replacement therapy (postmenopausal): Secondary | ICD-10-CM | POA: Diagnosis not present

## 2017-05-01 DIAGNOSIS — E039 Hypothyroidism, unspecified: Secondary | ICD-10-CM | POA: Insufficient documentation

## 2017-05-01 DIAGNOSIS — Z951 Presence of aortocoronary bypass graft: Secondary | ICD-10-CM | POA: Diagnosis not present

## 2017-05-01 DIAGNOSIS — I350 Nonrheumatic aortic (valve) stenosis: Secondary | ICD-10-CM | POA: Diagnosis not present

## 2017-05-01 DIAGNOSIS — I2510 Atherosclerotic heart disease of native coronary artery without angina pectoris: Secondary | ICD-10-CM | POA: Diagnosis not present

## 2017-05-01 DIAGNOSIS — K219 Gastro-esophageal reflux disease without esophagitis: Secondary | ICD-10-CM | POA: Diagnosis not present

## 2017-05-01 DIAGNOSIS — I441 Atrioventricular block, second degree: Secondary | ICD-10-CM | POA: Diagnosis not present

## 2017-05-01 DIAGNOSIS — E785 Hyperlipidemia, unspecified: Secondary | ICD-10-CM | POA: Insufficient documentation

## 2017-05-01 DIAGNOSIS — I5032 Chronic diastolic (congestive) heart failure: Secondary | ICD-10-CM

## 2017-05-01 NOTE — Progress Notes (Signed)
Patient ID: Allen Holloway, male   DOB: 10-07-1926, 84 y.o.   MRN: [id number] PCP: Dr. Anitra Lauth Cardiology: Dr. Aundra Dubin  84 y.o. with history of CAD s/p CABG in 2005 with nonischemic myoview in 2008 and CKD stage III-IV presents for followup of CAD and chronic diastolic CHF. Last echo in 11/18 showed EF 55-60%, moderately dilated RV with normal systolic function, PASP 56 mmHg.  He has had conduction abnormalities noted in the past and was taken off metoprolol.   Lasix recently decreased to 40 mg daily due to elevation in creatinine.  He had a steroid injection in his hip recently, decreased hip pain.  Generally feeling ok, denies exertional dyspnea walking on flat ground or doing ADLs.  No orthopnea/PND.  No chest pain.  No palpitations or lightheadedness. Weight is down 1 lb.   Labs (6/11): K 5.3, creatinine 1.6, LDL 70, HDL 58, HCT 37, TSH normal  Labs (11/12): K 4.5, creatinine 1.9, LDL 77, HDL 60 Labs (5/14): BNP 296 Labs (9/14): K 4.5, creatinine 1.8 Labs (10/14): K 4.6, creatinine 1.8, BNP 203 Labs (11/14): LDL 86, HDL 43, K 4.8, creatinine 2.0 Labs (1/16): K 5, creatinine 2.0, LDL 143, HDL 41.5 Labs (1/17): LDL 39, HDL 47, K 5, creatinine 2.0 Labs (10/18): LDL 48, HDL 42, K 5.1, creatinine 2.38 Labs (1/19): creatinine 3.34  Allergies (verified):  1) Lotensin (Benazepril Hcl)   Past Medical History:  1. COPD  2. Coronary artery disease: CABG 2005 with LIMA-LAD, SVG-PDA, SVG-D1, SVG-OM. Myoview (9/08) showed EF 63%, no scar or ischemia.  Echo (8/11): EF 55%, mild AI, mild MR, moderate TR, mildly dilated RV, PA systolic pressure 34 mmHg.  Echo (6/14): EF 50-55%, mild LVH, mild AS, mild MR, mildly dilated RV.  3. Hyperlipidemia: Myalgias Lipitor and Crestor, 40 mg Lipitor.  4. Hypertension  5. Carotid bruit: Carotid dopplers (8/12) with 54-27% RICA, 06-23% LICA.  Carotid dopplers (4/14) with 60-79% bilateral ICA stenosis.  Carotid dopplers (10/14) with 60-79% bilateral ICA stenosis.  Carotid dopplers (1/16) with 60-79% BICA stenosis.  - Carotid doppler (1/17): 76-28% RICA, 31-51% LICA.  - Carotid doppler (12/18): 40-59% RICA, right subclavian stenosis.  6. Cholelithiasis  7. CKD stage IV 8. Low back pain  9. Osteoarthritis  10. GERD  11. Hypothyroidism  12. ABIs normal 8/11, 3/15, 12/18.  13. 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  14. Chronic diastolic CHF - Echo (76/16): EF 55-60%, mild MR, moderate TR.  - Echo (11/18): EF 55-60%, no significant aortic stenosis, moderately dilated RV with normal systolic function, PASP 56 mmHg.  15. Aortic stenosis: Mild on 6/14 echo, minimal on 10/16 echo.   Family History:  Family History High cholesterol  Family History Hypertension   Social History:  Retired  Married  Former Smoker   ROS: All systems reviewed and negative except as per HPI.   Current Outpatient Medications  Medication Sig Dispense Refill  . aspirin 325 MG tablet Take 162.5 mg by mouth daily. TAKE 1/2 TAB     . atorvastatin (LIPITOR) 20 MG tablet Take 10 mg by mouth daily.    . Cholecalciferol (VITAMIN D3) 1000 UNITS tablet Take 1,000 Units by mouth daily.      . Coenzyme Q10 200 MG TABS Take 1 tablet by mouth daily.   0  . furosemide (LASIX) 40 MG tablet Take 40 mg by mouth daily.    . Garlic 0737 MG CAPS Take 1 capsule by mouth  daily.    . Krill Oil 350 MG CAPS Take 1 capsule by mouth daily.    Marland Kitchen levothyroxine (SYNTHROID, LEVOTHROID) 100 MCG tablet Take 100 mcg by mouth daily.      Marland Kitchen omeprazole (PRILOSEC) 20 MG capsule Take 20 mg by mouth daily.      No current facility-administered medications for this encounter.     BP 135/66   Pulse 68   Wt 201 lb 8 oz (91.4 kg)   SpO2 100%   BMI 31.09 kg/m  General: NAD Neck: No JVD, no thyromegaly or thyroid nodule.  Lungs: Clear to auscultation bilaterally with normal respiratory effort. CV: Nondisplaced PMI.  Heart regular S1/S2, no S3/S4, 2/6  early SEM RUSB.  1+ ankle edema.  Right carotid bruit.  Unable to palpate pedal pulses.  Abdomen: Soft, nontender, no hepatosplenomegaly, no distention.  Skin: Intact without lesions or rashes.  Neurologic: Alert and oriented x 3.  Psych: Normal affect. Extremities: No clubbing or cyanosis.  HEENT: Normal.   Assessment/Plan: 1. CAD: Stable without chest pain.  Continue ASA and statin. 2. Chronic diastolic CHF:  Volume status looks ok today.    - Keep Lasix at 40 mg daily.   3. Hyperlipidemia: Myalgias with Crestor, pravastatin, and atorvastatin 40 daily.  He is able to tolerate atorvastatin 20 daily.  Excellent lipids in 10/18.    4. Carotid stenosis: Moderate right carotid stenosis, repeat dopplers in 12/19.  5. 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  No symptoms of bradycardia.  He is off beta blockers.   6. CKD: Stage IV.  Lasix recently decreased, repeating BMET with PCP in a week or so.   He avoids NSAIDs.  7. PAD: Difficult to palpate pedal pulses but ABIs normal in 12/18.   8. Aortic stenosis: No significant stenosis on most recent echo.   Followup in 6 mos.  Loralie Champagne 05/01/2017

## 2017-05-01 NOTE — Patient Instructions (Signed)
Rx given to get compression stockings  Your physician recommends that you schedule a follow-up appointment in: 6 months with Dr. Aundra Dubin  (we will call you)

## 2017-05-14 ENCOUNTER — Other Ambulatory Visit (INDEPENDENT_AMBULATORY_CARE_PROVIDER_SITE_OTHER): Payer: Medicare Other

## 2017-05-14 DIAGNOSIS — N184 Chronic kidney disease, stage 4 (severe): Secondary | ICD-10-CM

## 2017-05-14 LAB — BASIC METABOLIC PANEL
BUN: 38 mg/dL — ABNORMAL HIGH (ref 6–23)
CALCIUM: 9.3 mg/dL (ref 8.4–10.5)
CHLORIDE: 107 meq/L (ref 96–112)
CO2: 27 mEq/L (ref 19–32)
Creatinine, Ser: 2.54 mg/dL — ABNORMAL HIGH (ref 0.40–1.50)
GFR: 25.51 mL/min — AB (ref >60.00)
Glucose, Bld: 113 mg/dL — ABNORMAL HIGH (ref 70–99)
Potassium: 5.1 mEq/L (ref 3.5–5.1)
Sodium: 141 mEq/L (ref 135–145)

## 2017-05-15 DIAGNOSIS — M7061 Trochanteric bursitis, right hip: Secondary | ICD-10-CM | POA: Insufficient documentation

## 2017-05-16 DIAGNOSIS — M5136 Other intervertebral disc degeneration, lumbar region: Secondary | ICD-10-CM | POA: Diagnosis not present

## 2017-05-16 DIAGNOSIS — S39012D Strain of muscle, fascia and tendon of lower back, subsequent encounter: Secondary | ICD-10-CM | POA: Diagnosis not present

## 2017-05-16 DIAGNOSIS — M7061 Trochanteric bursitis, right hip: Secondary | ICD-10-CM | POA: Diagnosis not present

## 2017-07-25 ENCOUNTER — Encounter: Payer: Self-pay | Admitting: Family Medicine

## 2017-07-25 ENCOUNTER — Ambulatory Visit (INDEPENDENT_AMBULATORY_CARE_PROVIDER_SITE_OTHER): Payer: Medicare Other | Admitting: Family Medicine

## 2017-07-25 VITALS — BP 136/68 | HR 53 | Temp 97.7°F | Resp 16 | Ht 67.5 in | Wt 205.4 lb

## 2017-07-25 DIAGNOSIS — E119 Type 2 diabetes mellitus without complications: Secondary | ICD-10-CM | POA: Diagnosis not present

## 2017-07-25 DIAGNOSIS — N183 Chronic kidney disease, stage 3 unspecified: Secondary | ICD-10-CM

## 2017-07-25 DIAGNOSIS — I10 Essential (primary) hypertension: Secondary | ICD-10-CM | POA: Diagnosis not present

## 2017-07-25 LAB — BASIC METABOLIC PANEL
BUN: 42 mg/dL — AB (ref 6–23)
CO2: 28 meq/L (ref 19–32)
Calcium: 9.3 mg/dL (ref 8.4–10.5)
Chloride: 106 mEq/L (ref 96–112)
Creatinine, Ser: 2.55 mg/dL — ABNORMAL HIGH (ref 0.40–1.50)
GFR: 25.39 mL/min — ABNORMAL LOW (ref >60.00)
GLUCOSE: 96 mg/dL (ref 70–99)
Potassium: 4.6 mEq/L (ref 3.5–5.1)
Sodium: 140 mEq/L (ref 135–145)

## 2017-07-25 LAB — HEMOGLOBIN A1C: Hgb A1c MFr Bld: 7 % — ABNORMAL HIGH (ref 4.6–6.5)

## 2017-07-25 NOTE — Progress Notes (Signed)
OFFICE VISIT  07/25/2017   CC:  Chief Complaint  Patient presents with  . Follow-up    RCI, pt is not fasting.     HPI:    Patient is a 84 y.o. Caucasian male who presents for 3 mo f/u DM 2, HTN, and CRI stage III.  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  ECHO 02/2017: LVEF/LV wall motion normal, mod tricusp regurg and mild pulm HTN.  He has NYHA class 3 sx's of CHF.  DM 2: no home glucose checks.  Home bp checks consistently 130s/60s.  HR 50s.  No new complaints.  ROS: occ increase in LE swelling.  No CP, no palpitations, no dizziness.  No fevers.  Past Medical History:  Diagnosis Date  . CAD (coronary artery disease)   . Carotid bruit    60-79% bilat In carot sten, stable over serial exams.  04/2015 stable 29-56% RICA, 21-30% LICA.  Repeat 1 yr.  . Cholelithiasis   . Chronic diastolic heart failure ([place])    CHF clinic  . Chronic renal insufficiency, stage III (moderate) (HCC)    Stage III/IV--GFR 25-30 ml/min.  . 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  Avoid BB/verap/dilt  . COPD (chronic obstructive pulmonary disease) ([place])   . Diabetes mellitus without complication ([place])    11/6576 HbA1c 6.8%  . Diverticulosis   . GERD (gastroesophageal reflux disease)   . Hyperlipidemia   . Hypertension   . Hypothyroidism   . Lumbago   . Osteoarthritis   . Right hip pain    Trochanteric bursitis: Dr. Alvan Dame injected this 09/2015.  Hip x-rays normal at Dr. Aurea Graff office.  . Venous insufficiency of both lower extremities     Past Surgical History:  Procedure Laterality Date  . APPENDECTOMY    . CORONARY ARTERY BYPASS GRAFT  2005   LIMA-LAD,SVG-PDA,SVG-D1,SVG-OM.  Myoview (9/08) showed EF63%,no scaror ischemia.  . TRANSTHORACIC ECHOCARDIOGRAM  01/29/2015; 03/08/17   2016  EF 55-60%, normal LV function and wall motion.  Mod tricusp regurg.  2018 EF 55-60%, normal wall motion, mod tricusp regurg.  Pulmonary arteries: Systolic pressure was moderately increased (secondary to DD).    Outpatient Medications Prior to Visit  Medication Sig Dispense Refill  . aspirin 325 MG tablet Take 162.5 mg by mouth daily. TAKE 1/2 TAB     . atorvastatin (LIPITOR) 20 MG tablet Take 10 mg by mouth daily.    . Cholecalciferol (VITAMIN D3) 1000 UNITS tablet Take 1,000 Units by mouth daily.      . Coenzyme Q10 200 MG TABS Take 1 tablet by mouth daily.   0  . furosemide (LASIX) 40 MG tablet Take 40 mg by mouth daily.    . Garlic 4696 MG CAPS Take 1 capsule by mouth daily.    [name] Oil 350 MG CAPS Take 1 capsule by mouth daily.    Marland Kitchen levothyroxine (SYNTHROID, LEVOTHROID) 100 MCG tablet Take 100 mcg by mouth daily.      Marland Kitchen omeprazole (PRILOSEC) 20 MG capsule Take 20 mg by mouth daily.      No facility-administered medications prior to visit.     Allergies  Allergen Reactions  . Amlodipine     edema  . Benazepril Hcl     REACTION: elev K  ROS As per HPI  PE: Blood pressure 136/68, pulse (!) 53, temperature 97.7 F (36.5 C), temperature source Oral, resp. rate 16, height 5' 7.5" (1.715 m), weight 205 lb 6 oz (93.2 kg), SpO2 99 %. Gen: Alert, well appearing.  Patient is oriented to person, place, time, and situation. AFFECT: pleasant, lucid thought and speech. CV: Regular brady into 50s, distant S1 and S2, no audible m/r/g. Chest is clear, no wheezing or rales. Normal symmetric air entry throughout both lung fields. No chest wall deformities or tenderness. EXT: no clubbing or cyanosis.  2+ pitting edema bilat, with pink/diffuse/hyperkeratotic pretibial surfaces.  No skin breakdown.  LABS:  Lab Results  Component Value Date   TSH 3.31 10/10/2016   Lab Results  Component Value Date   WBC 9.6 05/05/2016   HGB 12.4 (L) 05/05/2016   HCT 37.3 (L) 05/05/2016   MCV 87.8  05/05/2016   PLT 191.0 05/05/2016   Lab Results  Component Value Date   CREATININE 2.54 (H) 05/14/2017   BUN 38 (H) 05/14/2017   NA 141 05/14/2017   K 5.1 05/14/2017   CL 107 05/14/2017   CO2 27 05/14/2017   Lab Results  Component Value Date   ALT 15 05/31/2016   AST 18 05/31/2016   ALKPHOS 108 05/31/2016   BILITOT 0.7 05/31/2016   Lab Results  Component Value Date   CHOL 104 01/24/2017   Lab Results  Component Value Date   HDL 41.70 01/24/2017   Lab Results  Component Value Date   LDLCALC 48 01/24/2017   Lab Results  Component Value Date   TRIG 75.0 01/24/2017   Lab Results  Component Value Date   CHOLHDL 3 01/24/2017   Lab Results  Component Value Date   PSA 0.76 12/31/2013   PSA 0.68 10/09/2012   PSA 0.49 09/30/2009   Lab Results  Component Value Date   HGBA1C 6.9 (H) 04/26/2017     IMPRESSION AND PLAN:  1) DM 2: diet controlled. Hba1c today. BMET today.  2) HTN: The current medical regimen is effective;  continue present plan and medications. BMET today.  3) CRI stage III.  Avoiding NSAIDs, hydrating adequately. Lytes/cr today. A1c, BMET.  4) Chronic diastolic HF: some chronic LE edema but this is stable. Continue diuretic at current dosing and continue routine f/u with CHF clinic.  An After Visit Summary was printed and given to the patient.  FOLLOW UP: Return in about 3 months (around 10/24/2017) for routine chronic illness f/u.  Signed:  Crissie Sickles, MD           07/25/2017

## 2017-08-03 ENCOUNTER — Inpatient Hospital Stay (HOSPITAL_COMMUNITY)
Admission: EM | Admit: 2017-08-03 | Discharge: 2017-08-07 | DRG: 872 | Disposition: A | Discharge status: Home Health | Payer: Medicare Other | Attending: Family Medicine | Admitting: Family Medicine

## 2017-08-03 ENCOUNTER — Other Ambulatory Visit: Payer: Self-pay

## 2017-08-03 ENCOUNTER — Emergency Department (HOSPITAL_COMMUNITY): Payer: Medicare Other

## 2017-08-03 ENCOUNTER — Encounter (HOSPITAL_COMMUNITY): Payer: Self-pay | Admitting: Emergency Medicine

## 2017-08-03 DIAGNOSIS — N183 Chronic kidney disease, stage 3 unspecified: Secondary | ICD-10-CM | POA: Diagnosis present

## 2017-08-03 DIAGNOSIS — E1122 Type 2 diabetes mellitus with diabetic chronic kidney disease: Secondary | ICD-10-CM | POA: Diagnosis not present

## 2017-08-03 DIAGNOSIS — I2510 Atherosclerotic heart disease of native coronary artery without angina pectoris: Secondary | ICD-10-CM | POA: Diagnosis not present

## 2017-08-03 DIAGNOSIS — B9620 Unspecified Escherichia coli [E. coli] as the cause of diseases classified elsewhere: Secondary | ICD-10-CM | POA: Diagnosis present

## 2017-08-03 DIAGNOSIS — A419 Sepsis, unspecified organism: Secondary | ICD-10-CM

## 2017-08-03 DIAGNOSIS — I130 Hypertensive heart and chronic kidney disease with heart failure and stage 1 through stage 4 chronic kidney disease, or unspecified chronic kidney disease: Secondary | ICD-10-CM | POA: Diagnosis not present

## 2017-08-03 DIAGNOSIS — R7881 Bacteremia: Secondary | ICD-10-CM | POA: Diagnosis not present

## 2017-08-03 DIAGNOSIS — R42 Dizziness and giddiness: Secondary | ICD-10-CM | POA: Diagnosis present

## 2017-08-03 DIAGNOSIS — J449 Chronic obstructive pulmonary disease, unspecified: Secondary | ICD-10-CM | POA: Diagnosis not present

## 2017-08-03 DIAGNOSIS — E039 Hypothyroidism, unspecified: Secondary | ICD-10-CM | POA: Diagnosis present

## 2017-08-03 DIAGNOSIS — I5032 Chronic diastolic (congestive) heart failure: Secondary | ICD-10-CM | POA: Diagnosis not present

## 2017-08-03 DIAGNOSIS — I480 Paroxysmal atrial fibrillation: Secondary | ICD-10-CM | POA: Diagnosis not present

## 2017-08-03 DIAGNOSIS — N3001 Acute cystitis with hematuria: Secondary | ICD-10-CM

## 2017-08-03 DIAGNOSIS — Z87891 Personal history of nicotine dependence: Secondary | ICD-10-CM

## 2017-08-03 DIAGNOSIS — A021 Salmonella sepsis: Principal | ICD-10-CM | POA: Diagnosis present

## 2017-08-03 DIAGNOSIS — D631 Anemia in chronic kidney disease: Secondary | ICD-10-CM | POA: Diagnosis present

## 2017-08-03 DIAGNOSIS — Z888 Allergy status to other drugs, medicaments and biological substances status: Secondary | ICD-10-CM | POA: Diagnosis not present

## 2017-08-03 DIAGNOSIS — N390 Urinary tract infection, site not specified: Secondary | ICD-10-CM | POA: Diagnosis present

## 2017-08-03 DIAGNOSIS — I441 Atrioventricular block, second degree: Secondary | ICD-10-CM | POA: Diagnosis present

## 2017-08-03 DIAGNOSIS — K5790 Diverticulosis of intestine, part unspecified, without perforation or abscess without bleeding: Secondary | ICD-10-CM | POA: Diagnosis present

## 2017-08-03 DIAGNOSIS — K219 Gastro-esophageal reflux disease without esophagitis: Secondary | ICD-10-CM | POA: Diagnosis present

## 2017-08-03 DIAGNOSIS — I10 Essential (primary) hypertension: Secondary | ICD-10-CM | POA: Diagnosis not present

## 2017-08-03 DIAGNOSIS — R404 Transient alteration of awareness: Secondary | ICD-10-CM | POA: Diagnosis not present

## 2017-08-03 DIAGNOSIS — M1990 Unspecified osteoarthritis, unspecified site: Secondary | ICD-10-CM | POA: Diagnosis present

## 2017-08-03 DIAGNOSIS — N184 Chronic kidney disease, stage 4 (severe): Secondary | ICD-10-CM | POA: Diagnosis not present

## 2017-08-03 DIAGNOSIS — Z951 Presence of aortocoronary bypass graft: Secondary | ICD-10-CM

## 2017-08-03 DIAGNOSIS — I4891 Unspecified atrial fibrillation: Secondary | ICD-10-CM | POA: Diagnosis present

## 2017-08-03 DIAGNOSIS — E785 Hyperlipidemia, unspecified: Secondary | ICD-10-CM | POA: Diagnosis present

## 2017-08-03 DIAGNOSIS — A029 Salmonella infection, unspecified: Secondary | ICD-10-CM | POA: Diagnosis not present

## 2017-08-03 DIAGNOSIS — R197 Diarrhea, unspecified: Secondary | ICD-10-CM | POA: Diagnosis present

## 2017-08-03 DIAGNOSIS — I872 Venous insufficiency (chronic) (peripheral): Secondary | ICD-10-CM | POA: Diagnosis present

## 2017-08-03 DIAGNOSIS — Z79890 Hormone replacement therapy (postmenopausal): Secondary | ICD-10-CM

## 2017-08-03 DIAGNOSIS — I361 Nonrheumatic tricuspid (valve) insufficiency: Secondary | ICD-10-CM | POA: Diagnosis not present

## 2017-08-03 DIAGNOSIS — I129 Hypertensive chronic kidney disease with stage 1 through stage 4 chronic kidney disease, or unspecified chronic kidney disease: Secondary | ICD-10-CM | POA: Diagnosis not present

## 2017-08-03 DIAGNOSIS — R300 Dysuria: Secondary | ICD-10-CM | POA: Diagnosis not present

## 2017-08-03 DIAGNOSIS — R531 Weakness: Secondary | ICD-10-CM | POA: Diagnosis not present

## 2017-08-03 LAB — COMPREHENSIVE METABOLIC PANEL
ALBUMIN: 3.5 g/dL (ref 3.5–5.0)
ALT: 17 U/L (ref 17–63)
AST: 28 U/L (ref 15–41)
Alkaline Phosphatase: 82 U/L (ref 38–126)
Anion gap: 10 (ref 5–15)
BUN: 40 mg/dL — AB (ref 6–20)
CO2: 20 mmol/L — AB (ref 22–32)
CREATININE: 2.77 mg/dL — AB (ref 0.61–1.24)
Calcium: 8.7 mg/dL — ABNORMAL LOW (ref 8.9–10.3)
Chloride: 106 mmol/L (ref 101–111)
GFR calc Af Amer: 22 mL/min — ABNORMAL LOW (ref >60)
GFR calc non Af Amer: 19 mL/min — ABNORMAL LOW (ref >60)
Glucose, Bld: 130 mg/dL — ABNORMAL HIGH (ref 65–99)
POTASSIUM: 3.8 mmol/L (ref 3.5–5.1)
SODIUM: 136 mmol/L (ref 135–145)
Total Bilirubin: 1.1 mg/dL (ref 0.3–1.2)
Total Protein: 6.6 g/dL (ref 6.5–8.1)

## 2017-08-03 LAB — I-STAT TROPONIN, ED: TROPONIN I, POC: 0.08 ng/mL (ref 0.00–0.08)

## 2017-08-03 LAB — CBC WITH DIFFERENTIAL/PLATELET
Basophils Absolute: 0 10*3/uL (ref 0.0–0.1)
Basophils Relative: 0 %
EOS PCT: 0 %
Eosinophils Absolute: 0 10*3/uL (ref 0.0–0.7)
HCT: 34 % — ABNORMAL LOW (ref 39.0–52.0)
HEMOGLOBIN: 10.5 g/dL — AB (ref 13.0–17.0)
LYMPHS ABS: 1.3 10*3/uL (ref 0.7–4.0)
LYMPHS PCT: 14 %
MCH: 27.6 pg (ref 26.0–34.0)
MCHC: 30.9 g/dL (ref 30.0–36.0)
MCV: 89.5 fL (ref 78.0–100.0)
MONOS PCT: 5 %
Monocytes Absolute: 0.5 10*3/uL (ref 0.1–1.0)
NEUTROS PCT: 81 %
Neutro Abs: 7.6 10*3/uL (ref 1.7–7.7)
Platelets: 145 10*3/uL — ABNORMAL LOW (ref 150–400)
RBC: 3.8 MIL/uL — ABNORMAL LOW (ref 4.22–5.81)
RDW: 16 % — ABNORMAL HIGH (ref 11.5–15.5)
WBC: 9.3 10*3/uL (ref 4.0–10.5)

## 2017-08-03 LAB — URINALYSIS, ROUTINE W REFLEX MICROSCOPIC
BILIRUBIN URINE: NEGATIVE
Glucose, UA: NEGATIVE mg/dL
Ketones, ur: NEGATIVE mg/dL
NITRITE: POSITIVE — AB
PROTEIN: 100 mg/dL — AB
Specific Gravity, Urine: 1.016 (ref 1.005–1.030)
pH: 5 (ref 5.0–8.0)

## 2017-08-03 LAB — PROTIME-INR
INR: 1.18
PROTHROMBIN TIME: 14.9 s (ref 11.4–15.2)

## 2017-08-03 LAB — I-STAT CG4 LACTIC ACID, ED: LACTIC ACID, VENOUS: 1.08 mmol/L (ref 0.5–1.9)

## 2017-08-03 MED ORDER — SODIUM CHLORIDE 0.9 % IV BOLUS
500.0000 mL | Freq: Once | INTRAVENOUS | Status: AC
  Administered 2017-08-03: 500 mL via INTRAVENOUS

## 2017-08-03 MED ORDER — SODIUM CHLORIDE 0.9 % IV SOLN
1.0000 g | Freq: Once | INTRAVENOUS | Status: AC
  Administered 2017-08-03: 1 g via INTRAVENOUS
  Filled 2017-08-03: qty 10

## 2017-08-03 NOTE — ED Notes (Signed)
Bed: WA20 Expected date:  Expected time:  Means of arrival:  Comments: EMS 84 yo male lightheaded/dizziness/weakness

## 2017-08-03 NOTE — ED Triage Notes (Signed)
Pt arriving from home via EMS. Pt was in the bathroom and became light headed when he tried to stand up. Pt lowered himself to the floor. Denies falling or LOC. No pain reported. Pt has had this happen roughly 6 months ago. Pt has had poor intake of food and fluids today. Pt A&O x 4.  18g L AC
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
## 2017-08-04 ENCOUNTER — Encounter (HOSPITAL_COMMUNITY): Payer: Self-pay | Admitting: Emergency Medicine

## 2017-08-04 DIAGNOSIS — I480 Paroxysmal atrial fibrillation: Secondary | ICD-10-CM | POA: Diagnosis present

## 2017-08-04 DIAGNOSIS — Z87891 Personal history of nicotine dependence: Secondary | ICD-10-CM | POA: Diagnosis not present

## 2017-08-04 DIAGNOSIS — I4891 Unspecified atrial fibrillation: Secondary | ICD-10-CM | POA: Diagnosis not present

## 2017-08-04 DIAGNOSIS — A021 Salmonella sepsis: Secondary | ICD-10-CM | POA: Diagnosis not present

## 2017-08-04 DIAGNOSIS — R42 Dizziness and giddiness: Secondary | ICD-10-CM | POA: Diagnosis present

## 2017-08-04 DIAGNOSIS — J449 Chronic obstructive pulmonary disease, unspecified: Secondary | ICD-10-CM | POA: Diagnosis present

## 2017-08-04 DIAGNOSIS — N183 Chronic kidney disease, stage 3 (moderate): Secondary | ICD-10-CM | POA: Diagnosis not present

## 2017-08-04 DIAGNOSIS — I2510 Atherosclerotic heart disease of native coronary artery without angina pectoris: Secondary | ICD-10-CM | POA: Diagnosis present

## 2017-08-04 DIAGNOSIS — E1122 Type 2 diabetes mellitus with diabetic chronic kidney disease: Secondary | ICD-10-CM | POA: Diagnosis present

## 2017-08-04 DIAGNOSIS — Z79890 Hormone replacement therapy (postmenopausal): Secondary | ICD-10-CM | POA: Diagnosis not present

## 2017-08-04 DIAGNOSIS — Z951 Presence of aortocoronary bypass graft: Secondary | ICD-10-CM | POA: Diagnosis not present

## 2017-08-04 DIAGNOSIS — N390 Urinary tract infection, site not specified: Secondary | ICD-10-CM | POA: Diagnosis present

## 2017-08-04 DIAGNOSIS — I10 Essential (primary) hypertension: Secondary | ICD-10-CM

## 2017-08-04 DIAGNOSIS — E785 Hyperlipidemia, unspecified: Secondary | ICD-10-CM | POA: Diagnosis present

## 2017-08-04 DIAGNOSIS — K219 Gastro-esophageal reflux disease without esophagitis: Secondary | ICD-10-CM | POA: Diagnosis present

## 2017-08-04 DIAGNOSIS — R197 Diarrhea, unspecified: Secondary | ICD-10-CM | POA: Diagnosis present

## 2017-08-04 DIAGNOSIS — E039 Hypothyroidism, unspecified: Secondary | ICD-10-CM | POA: Diagnosis present

## 2017-08-04 DIAGNOSIS — M1990 Unspecified osteoarthritis, unspecified site: Secondary | ICD-10-CM | POA: Diagnosis present

## 2017-08-04 DIAGNOSIS — I361 Nonrheumatic tricuspid (valve) insufficiency: Secondary | ICD-10-CM | POA: Diagnosis not present

## 2017-08-04 DIAGNOSIS — I872 Venous insufficiency (chronic) (peripheral): Secondary | ICD-10-CM | POA: Diagnosis present

## 2017-08-04 DIAGNOSIS — B9620 Unspecified Escherichia coli [E. coli] as the cause of diseases classified elsewhere: Secondary | ICD-10-CM | POA: Diagnosis not present

## 2017-08-04 DIAGNOSIS — I130 Hypertensive heart and chronic kidney disease with heart failure and stage 1 through stage 4 chronic kidney disease, or unspecified chronic kidney disease: Secondary | ICD-10-CM | POA: Diagnosis not present

## 2017-08-04 DIAGNOSIS — N184 Chronic kidney disease, stage 4 (severe): Secondary | ICD-10-CM | POA: Diagnosis present

## 2017-08-04 DIAGNOSIS — A029 Salmonella infection, unspecified: Secondary | ICD-10-CM | POA: Diagnosis not present

## 2017-08-04 DIAGNOSIS — R7881 Bacteremia: Secondary | ICD-10-CM | POA: Diagnosis not present

## 2017-08-04 DIAGNOSIS — I5032 Chronic diastolic (congestive) heart failure: Secondary | ICD-10-CM | POA: Diagnosis not present

## 2017-08-04 DIAGNOSIS — K5790 Diverticulosis of intestine, part unspecified, without perforation or abscess without bleeding: Secondary | ICD-10-CM | POA: Diagnosis present

## 2017-08-04 DIAGNOSIS — D631 Anemia in chronic kidney disease: Secondary | ICD-10-CM | POA: Diagnosis present

## 2017-08-04 DIAGNOSIS — I441 Atrioventricular block, second degree: Secondary | ICD-10-CM | POA: Diagnosis present

## 2017-08-04 LAB — CBC
HCT: 32.1 % — ABNORMAL LOW (ref 39.0–52.0)
Hemoglobin: 10 g/dL — ABNORMAL LOW (ref 13.0–17.0)
MCH: 27.9 pg (ref 26.0–34.0)
MCHC: 31.2 g/dL (ref 30.0–36.0)
MCV: 89.7 fL (ref 78.0–100.0)
PLATELETS: 139 10*3/uL — AB (ref 150–400)
RBC: 3.58 MIL/uL — AB (ref 4.22–5.81)
RDW: 16.1 % — AB (ref 11.5–15.5)
WBC: 7.1 10*3/uL (ref 4.0–10.5)

## 2017-08-04 LAB — C DIFFICILE QUICK SCREEN W PCR REFLEX
C DIFFICILE (CDIFF) INTERP: NOT DETECTED
C DIFFICILE (CDIFF) TOXIN: NEGATIVE
C DIFFICLE (CDIFF) ANTIGEN: NEGATIVE

## 2017-08-04 LAB — TROPONIN I
TROPONIN I: 0.16 ng/mL — AB (ref <0.03)
Troponin I: 0.19 ng/mL (ref <0.03)

## 2017-08-04 LAB — BLOOD CULTURE ID PANEL (REFLEXED)
Acinetobacter baumannii: NOT DETECTED
CANDIDA GLABRATA: NOT DETECTED
CANDIDA KRUSEI: NOT DETECTED
CARBAPENEM RESISTANCE: NOT DETECTED
Candida albicans: NOT DETECTED
Candida parapsilosis: NOT DETECTED
Candida tropicalis: NOT DETECTED
ENTEROCOCCUS SPECIES: NOT DETECTED
ESCHERICHIA COLI: NOT DETECTED
Enterobacter cloacae complex: NOT DETECTED
Enterobacteriaceae species: DETECTED — AB
Haemophilus influenzae: NOT DETECTED
KLEBSIELLA OXYTOCA: NOT DETECTED
Klebsiella pneumoniae: NOT DETECTED
LISTERIA MONOCYTOGENES: NOT DETECTED
NEISSERIA MENINGITIDIS: NOT DETECTED
PROTEUS SPECIES: NOT DETECTED
Pseudomonas aeruginosa: NOT DETECTED
STAPHYLOCOCCUS AUREUS BCID: NOT DETECTED
STAPHYLOCOCCUS SPECIES: NOT DETECTED
STREPTOCOCCUS PNEUMONIAE: NOT DETECTED
Serratia marcescens: NOT DETECTED
Streptococcus agalactiae: NOT DETECTED
Streptococcus pyogenes: NOT DETECTED
Streptococcus species: NOT DETECTED

## 2017-08-04 LAB — MAGNESIUM: MAGNESIUM: 2.1 mg/dL (ref 1.7–2.4)

## 2017-08-04 LAB — TSH: TSH: 0.886 u[IU]/mL (ref 0.350–4.500)

## 2017-08-04 LAB — OCCULT BLOOD X 1 CARD TO LAB, STOOL: FECAL OCCULT BLD: POSITIVE — AB

## 2017-08-04 MED ORDER — ACETAMINOPHEN 325 MG PO TABS
650.0000 mg | ORAL_TABLET | Freq: Four times a day (QID) | ORAL | Status: DC | PRN
  Administered 2017-08-04: 650 mg via ORAL
  Filled 2017-08-04: qty 2

## 2017-08-04 MED ORDER — POTASSIUM CHLORIDE IN NACL 20-0.9 MEQ/L-% IV SOLN
INTRAVENOUS | Status: AC
  Administered 2017-08-04: 03:00:00 via INTRAVENOUS
  Filled 2017-08-04: qty 1000

## 2017-08-04 MED ORDER — CEFTRIAXONE SODIUM 2 G IJ SOLR
2.0000 g | INTRAMUSCULAR | Status: DC
  Administered 2017-08-04 – 2017-08-06 (×3): 2 g via INTRAVENOUS
  Filled 2017-08-04 (×3): qty 2

## 2017-08-04 MED ORDER — SODIUM CHLORIDE 0.9 % IV SOLN
1.0000 g | INTRAVENOUS | Status: DC
  Filled 2017-08-04: qty 10

## 2017-08-04 MED ORDER — ENOXAPARIN SODIUM 30 MG/0.3ML ~~LOC~~ SOLN
30.0000 mg | SUBCUTANEOUS | Status: DC
  Administered 2017-08-04 – 2017-08-07 (×4): 30 mg via SUBCUTANEOUS
  Filled 2017-08-04 (×4): qty 0.3

## 2017-08-04 MED ORDER — ATORVASTATIN CALCIUM 10 MG PO TABS
10.0000 mg | ORAL_TABLET | Freq: Every day | ORAL | Status: DC
  Administered 2017-08-04 – 2017-08-07 (×4): 10 mg via ORAL
  Filled 2017-08-04 (×4): qty 1

## 2017-08-04 MED ORDER — LEVOTHYROXINE SODIUM 100 MCG PO TABS
100.0000 ug | ORAL_TABLET | Freq: Every day | ORAL | Status: DC
  Administered 2017-08-04 – 2017-08-07 (×4): 100 ug via ORAL
  Filled 2017-08-04 (×4): qty 1

## 2017-08-04 MED ORDER — ONDANSETRON HCL 4 MG/2ML IJ SOLN: 4.0000 mg | Freq: Four times a day (QID) | INTRAMUSCULAR | Status: DC | PRN

## 2017-08-04 MED ORDER — ONDANSETRON HCL 4 MG PO TABS: 4.0000 mg | ORAL_TABLET | Freq: Four times a day (QID) | ORAL | Status: DC | PRN

## 2017-08-04 MED ORDER — ASPIRIN 325 MG PO TABS
162.5000 mg | ORAL_TABLET | Freq: Every day | ORAL | Status: DC
  Administered 2017-08-04 – 2017-08-07 (×4): 162.5 mg via ORAL
  Filled 2017-08-04 (×4): qty 1

## 2017-08-04 MED ORDER — SODIUM CHLORIDE 0.9 % IV BOLUS
500.0000 mL | Freq: Once | INTRAVENOUS | Status: AC
  Administered 2017-08-04: 500 mL via INTRAVENOUS

## 2017-08-04 MED ORDER — PANTOPRAZOLE SODIUM 40 MG PO TBEC
40.0000 mg | DELAYED_RELEASE_TABLET | Freq: Every day | ORAL | Status: DC
Start: 2017-08-04 — End: 2017-08-07
  Administered 2017-08-04 – 2017-08-07 (×4): 40 mg via ORAL
  Filled 2017-08-04 (×4): qty 1

## 2017-08-04 NOTE — ED Notes (Signed)
ED TO INPATIENT HANDOFF REPORT  Name/Age/Gender Allen Holloway 84 y.o. male  Code Status   Home/SNF/Other Home  Chief Complaint Dizziness; Weakness  Level of Care/Admitting Diagnosis ED Disposition    ED Disposition Condition Comment   Admit  Hospital Area: [facility] [[id number]]  Level of Care: Med-Surg [16]  Diagnosis: UTI (urinary tract infection) [998338]  Admitting Physician: Bethena Roys [phone]  Attending Physician: Bethena Roys Nessa.Cuff  PT Class (Do Not Modify): Observation [104]  PT Acc Code (Do Not Modify): Observation [10022]       Medical History Past Medical History:  Diagnosis Date  . CAD (coronary artery disease)   . Carotid bruit    60-79% bilat In carot sten, stable over serial exams.  04/2015 stable 39-76% RICA, 73-41% LICA.  Repeat 1 yr.  . Cholelithiasis   . Chronic diastolic heart failure ([place])    CHF clinic  . Chronic renal insufficiency, stage III (moderate) (HCC)    Stage III/IV--GFR 25-30 ml/min.  . 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  Avoid BB/verap/dilt  . COPD (chronic obstructive pulmonary disease) ([place])   . Diabetes mellitus without complication ([place])    12/3788 HbA1c 6.8%  . Diverticulosis   . GERD (gastroesophageal reflux disease)   . Hyperlipidemia   . Hypertension   . Hypothyroidism   . Lumbago   . Osteoarthritis   . Right hip pain    Trochanteric bursitis: Dr. Alvan Dame injected this 09/2015.  Hip x-rays normal at Dr. Aurea Graff office.  . Venous insufficiency of both lower extremities     Allergies Allergies  Allergen Reactions  . Amlodipine     edema  . Benazepril Hcl     REACTION: elev K    IV Location/Drains/Wounds Patient Lines/Drains/Airways Status   Active Line/Drains/Airways    Name:   Placement date:   Placement time:   Site:   Days:   Peripheral IV 08/03/17 Left Antecubital   08/03/17    -    Antecubital   1          Labs/Imaging Results for orders placed or performed during the hospital encounter of 08/03/17 (from the past 48 hour(s))  Urinalysis, Routine w reflex microscopic     Status: Abnormal   Collection Time: 08/03/17  8:21 PM  Result Value Ref Range   Color, Urine YELLOW YELLOW   APPearance HAZY (A) CLEAR   Specific Gravity, Urine 1.016 1.005 - 1.030   pH 5.0 5.0 - 8.0   Glucose, UA NEGATIVE NEGATIVE mg/dL   Hgb urine dipstick MODERATE (A) NEGATIVE   Bilirubin Urine NEGATIVE NEGATIVE   Ketones, ur NEGATIVE NEGATIVE mg/dL   Protein, ur 100 (A) NEGATIVE mg/dL   Nitrite POSITIVE (A) NEGATIVE   Leukocytes, UA LARGE (A) NEGATIVE   RBC / HPF 0-5 0 - 5 RBC/hpf   WBC, UA TOO NUMEROUS TO COUNT 0 - 5 WBC/hpf   Bacteria, UA MANY (A) NONE SEEN   Squamous Epithelial / LPF 0-5 (A) NONE SEEN   Mucus PRESENT    Budding Yeast PRESENT    Hyaline Casts, UA PRESENT     Comment: Performed at [facility], [place] [address], [place], [place] [address]  Comprehensive metabolic panel     Status: Abnormal   Collection Time: 08/03/17  8:24 PM  Result Value  Ref Range   Sodium 136 135 - 145 mmol/L   Potassium 3.8 3.5 - 5.1 mmol/L   Chloride 106 101 - 111 mmol/L   CO2 20 (L) 22 - 32 mmol/L   Glucose, Bld 130 (H) 65 - 99 mg/dL   BUN 40 (H) 6 - 20 mg/dL   Creatinine, Ser 2.77 (H) 0.61 - 1.24 mg/dL   Calcium 8.7 (L) 8.9 - 10.3 mg/dL   Total Protein 6.6 6.5 - 8.1 g/dL   Albumin 3.5 3.5 - 5.0 g/dL   AST 28 15 - 41 U/L   ALT 17 17 - 63 U/L   Alkaline Phosphatase 82 38 - 126 U/L   Total Bilirubin 1.1 0.3 - 1.2 mg/dL   GFR calc non Af Amer 19 (L) >60 mL/min   GFR calc Af Amer 22 (L) >60 mL/min    Comment: (NOTE) The eGFR has been calculated using the CKD EPI equation. This calculation has not been validated in all clinical situations. eGFR's persistently <60 mL/min signify possible Chronic Kidney Disease.     Anion gap 10 5 - 15    Comment: Performed at [facility], [place] [address], [place], [place] [address]  CBC WITH DIFFERENTIAL     Status: Abnormal   Collection Time: 08/03/17  8:24 PM  Result Value Ref Range   WBC 9.3 4.0 - 10.5 K/uL   RBC 3.80 (L) 4.22 - 5.81 MIL/uL   Hemoglobin 10.5 (L) 13.0 - 17.0 g/dL   HCT 34.0 (L) 39.0 - 52.0 %   MCV 89.5 78.0 - 100.0 fL   MCH 27.6 26.0 - 34.0 pg   MCHC 30.9 30.0 - 36.0 g/dL   RDW 16.0 (H) 11.5 - 15.5 %   Platelets 145 (L) 150 - 400 K/uL   Neutrophils Relative % 81 %   Neutro Abs 7.6 1.7 - 7.7 K/uL   Lymphocytes Relative 14 %   Lymphs Abs 1.3 0.7 - 4.0 K/uL   Monocytes Relative 5 %   Monocytes Absolute 0.5 0.1 - 1.0 K/uL   Eosinophils Relative 0 %   Eosinophils Absolute 0.0 0.0 - 0.7 K/uL   Basophils Relative 0 %   Basophils Absolute 0.0 0.0 - 0.1 K/uL    Comment: Performed at [facility], [place] [address], [place], [place] [address]  Protime-INR     Status: None   Collection Time: 08/03/17  8:24 PM  Result Value Ref Range   Prothrombin Time 14.9 11.4 - 15.2 seconds   INR 1.18     Comment: Performed at [facility], [place] [address], [place], [place] [address]  I-stat troponin, ED (not at [facility], [facility])     Status: None   Collection Time: 08/03/17  8:39 PM  Result Value Ref Range   Troponin i, poc 0.08 0.00 - 0.08 ng/mL   Comment 3            Comment: Due to the release kinetics of cTnI, a negative result within the first hours of the onset of symptoms does not rule out myocardial infarction with certainty. If myocardial infarction is still suspected, repeat the test at appropriate intervals.   I-Stat CG4 Lactic Acid, ED  (not at  [facility])     Status: None   Collection Time: 08/03/17  8:44 PM  Result Value Ref Range   Lactic Acid, Venous 1.08 0.5 - 1.9 mmol/L   Dg Chest 2 View  Result Date: 08/03/2017 CLINICAL DATA:  Weakness.  Lightheadedness.  EXAM: CHEST - 2 VIEW COMPARISON:   Radiograph 11/01/2013 FINDINGS: Post median sternotomy. Mild cardiomegaly with aortic atherosclerosis. No pulmonary edema. Chronic elevation of right hemidiaphragm. No focal airspace disease, pleural effusion or pneumothorax. No acute osseous abnormalities. IMPRESSION: Mild cardiomegaly and aortic atherosclerosis.  No acute findings. Electronically Signed   By: Jeb Levering M.D.   On: 08/03/2017 20:59    Pending Labs Unresulted Labs (From admission, onward)   Start     Ordered   08/03/17 2015  Urine culture  STAT,   STAT     08/03/17 2015   08/03/17 2014  Blood Culture (routine x 2)  BLOOD CULTURE X 2,   STAT     08/03/17 2015   Signed and Held  CBC  Tomorrow morning,   R     Signed and Held      Vitals/Pain Today's Vitals   08/03/17 2032 08/03/17 2102 08/03/17 2232 08/03/17 2315  BP:  (!) 121/59  122/67  Pulse:  90  94  Resp:  20  (!) 21  Temp:      TempSrc:      SpO2: 96% 95%  98%  PainSc:   0-No pain     Isolation Precautions No active isolations  Medications Medications  cefTRIAXone (ROCEPHIN) 1 g in sodium chloride 0.9 % 100 mL IVPB (has no administration in time range)  cefTRIAXone (ROCEPHIN) 1 g in sodium chloride 0.9 % 100 mL IVPB (0 g Intravenous Stopped 08/03/17 2255)  sodium chloride 0.9 % bolus 500 mL (0 mLs Intravenous Stopped 08/03/17 2325)    Mobility walks

## 2017-08-04 NOTE — Progress Notes (Signed)
CRITICAL VALUE ALERT  Critical Value:  TROPONIN 0.16  Date & Time Notied:  4199 08/04/2017  Provider Notified: BLOUNT  Orders Received/Actions taken:

## 2017-08-04 NOTE — Progress Notes (Signed)
Patient Demographics:    Allen Holloway, is a 84 y.o. male, DOB - [birth year], IBB:[id number]  Admit date - 08/03/2017   Admitting Physician Ejiroghene Arlyce Dice, MD  Outpatient Primary MD for the patient is McGowen, Adrian Blackwater, MD  LOS - 0   Chief Complaint  Patient presents with  . Dizziness  . Weakness        Subjective:    Allen Holloway today has no fevers, no emesis,  No chest pain, no dysuria, no fevers, no chills, no chest pains, no palpitations no dizziness  Assessment  & Plan :    Active Problems:   Hypothyroidism   Essential hypertension   Coronary atherosclerosis   COPD (chronic obstructive pulmonary disease) (HCC)   Chronic renal insufficiency, stage 3 (moderate) (HCC)   Chronic diastolic CHF (congestive heart failure) (HCC)   UTI (urinary tract infection)   Atrial fibrillation, new onset [facility])  Brief summary  84 y.o. male with medical history significant for hypothyroidism, CKD3, HTN, Diastolic CHF, COPD admitted on 08/03/2017 with concerns about fevers, diarrhea, generalized weakness and dizziness and presumed UTI.. Admission blood cultures from 08/03/2017 with Enterobacteriaceae species, currently on Rocephin pending final ID and sensitivity    Plan:- 1)GNR Bacteremia-secondary to presumed urinary source/UTI, blood cx from 08/03/17 with Enterobacteriaceae species, increase Rocephin to 2 g daily pending final ID and sensitivity , patient was orthostatic on admission, hemodynamics have improved with IV fluids  2)Diarrhea-improved, stool for C. difficile is negative  3)Afib --- apparently new onset A. Fib, CHADSVASc at least 5, TSH and magnesium are within normal limits, borderline elevation of troponin noted, check echocardiogram, hold off on Full anticoagulation given dropping hemoglobin, continue aspirin  4)Acute Anemia-- ??? Etiology, baseline hemoglobin 12 to 13, hemoglobin is down  to 10, check stool occult blood , MCV and MCH are normal, however her RDW is elevated, ????  Anemia of CKD, baseline creatinine around 3  5)CKD IV-review of records reveals baseline creatinine around 3, creatinine currently 2.7, avoid nephrotoxic agents  6)Hypothyroidism-TSH is normal, continue levothyroxine 100 mcg daily  7)HFpEF-last known EF 55-60%, history of chronic diastolic dysfunction CHF, no acute exacerbation at this time, clinically does not appear volume overloaded, Lasix on hold, await repeat echo  Code Status : Full code  Disposition Plan  : TBD   DVT Prophylaxis  :  Lovenox    Lab Results  Component Value Date   PLT 139 (L) 08/04/2017    Inpatient Medications  Scheduled Meds: . aspirin  162.5 mg Oral Daily  . atorvastatin  10 mg Oral Daily  . enoxaparin (LOVENOX) injection  30 mg Subcutaneous Q24H  . levothyroxine  100 mcg Oral QAC breakfast  . pantoprazole  40 mg Oral Daily   Continuous Infusions: . cefTRIAXone (ROCEPHIN)  IV     PRN Meds:.acetaminophen, ondansetron **OR** ondansetron (ZOFRAN) IV    Anti-infectives (From admission, onward)   Start     Dose/Rate Route Frequency Ordered Stop   08/04/17 2100  cefTRIAXone (ROCEPHIN) 1 g in sodium chloride 0.9 % 100 mL IVPB  Status:  Discontinued     1 g 200 mL/hr over 30 Minutes Intravenous Every 24 hours 08/04/17 0006 08/04/17 1258   08/04/17 1600  cefTRIAXone (ROCEPHIN) 2 g in  sodium chloride 0.9 % 100 mL IVPB     2 g 200 mL/hr over 30 Minutes Intravenous Every 24 hours 08/04/17 1258     08/03/17 2145  cefTRIAXone (ROCEPHIN) 1 g in sodium chloride 0.9 % 100 mL IVPB     1 g 200 mL/hr over 30 Minutes Intravenous  Once 08/03/17 2133 08/03/17 2255        Objective:   Vitals:   08/04/17 0129 08/04/17 0355 08/04/17 0602 08/04/17 1337  BP: 129/60 122/69  116/70  Pulse: 83 77  82  Resp: 20 18  20   Temp: 100.2 F (37.9 C) (!) 100.8 F (38.2 C) 97.6 F (36.4 C) 99 F (37.2 C)  TempSrc: Oral Oral Oral    SpO2: 92% 98%  98%  Weight: 91.2 kg (201 lb 1 oz)     Height: 5\' 8"  (1.727 m)       Wt Readings from Last 3 Encounters:  08/04/17 91.2 kg (201 lb 1 oz)  07/25/17 93.2 kg (205 lb 6 oz)  05/01/17 91.4 kg (201 lb 8 oz)     Intake/Output Summary (Last 24 hours) at 08/04/2017 1504 Last data filed at 08/04/2017 0846 Gross per 24 hour  Intake 906.43 ml  Output 128 ml  Net 778.43 ml     Physical Exam  Gen:- Awake Alert,  In no apparent distress  HEENT:- [place].AT, No sclera icterus Neck-Supple Neck,No JVD,.  Lungs-  CTAB , good air movement CV- S1, S2 normal Abd-  +ve B.Sounds, Abd Soft, No tenderness, no CVA tenderness Extremity/Skin:- 1 +  edema,   good pulses Psych-affect is appropriate, oriented x3 Neuro-no new focal deficits, no tremors   Data Review:   Micro Results Recent Results (from the past 240 hour(s))  Blood Culture (routine x 2)     Status: None (Preliminary result)   Collection Time: 08/03/17  8:24 PM  Result Value Ref Range Status   Specimen Description   Final    BLOOD LEFT ANTECUBITAL Performed at [facility] [address] [place], [place] [address]    Special Requests   Final    BOTTLES DRAWN AEROBIC AND ANAEROBIC Blood Culture adequate volume Performed at [facility] [address], [place], [place] [address]    Culture  Setup Time   Final    GRAM NEGATIVE RODS IN BOTH AEROBIC AND ANAEROBIC BOTTLES CRITICAL RESULT CALLED TO, READ BACK BY AND VERIFIED WITH: TPICKERING,PHARMD @1249  08/04/17 BY LHOWARD Performed at [facility], [place] [address] [place], [place] [address]    Culture GRAM NEGATIVE RODS  Final   Report Status PENDING  Incomplete  Blood Culture (routine x 2)     Status: None (Preliminary result)   Collection Time: 08/03/17  8:24 PM  Result Value Ref Range Status   Specimen Description   Final    BLOOD RIGHT ANTECUBITAL Performed at [facility] [address],  [place], [place] [address]    Special Requests   Final    BOTTLES DRAWN AEROBIC AND ANAEROBIC Blood Culture adequate volume Performed at [facility] [address] [place], [place] [address]    Culture  Setup Time   Final    GRAM NEGATIVE RODS ANAEROBIC BOTTLE ONLY CRITICAL VALUE NOTED.  VALUE IS CONSISTENT WITH PREVIOUSLY REPORTED AND CALLED VALUE. Performed at [facility], [place] [address], [place], [place] [address]    Culture GRAM NEGATIVE RODS  Final   Report Status  PENDING  Incomplete  Blood Culture ID Panel (Reflexed)     Status: Abnormal   Collection Time: 08/03/17  8:24 PM  Result Value Ref Range Status   Enterococcus species NOT DETECTED NOT DETECTED Final   Listeria monocytogenes NOT DETECTED NOT DETECTED Final   Staphylococcus species NOT DETECTED NOT DETECTED Final   Staphylococcus aureus NOT DETECTED NOT DETECTED Final   Streptococcus species NOT DETECTED NOT DETECTED Final   Streptococcus agalactiae NOT DETECTED NOT DETECTED Final   Streptococcus pneumoniae NOT DETECTED NOT DETECTED Final   Streptococcus pyogenes NOT DETECTED NOT DETECTED Final   Acinetobacter baumannii NOT DETECTED NOT DETECTED Final   Enterobacteriaceae species DETECTED (A) NOT DETECTED Final    Comment: Enterobacteriaceae represent a large family of gram negative bacteria, not a single organism. Refer to culture for further identification. CRITICAL RESULT CALLED TO, READ BACK BY AND VERIFIED WITH: TPICKERING,PHARMD @1249  08/04/17 BY LHOWARD    Enterobacter cloacae complex NOT DETECTED NOT DETECTED Final   Escherichia coli NOT DETECTED NOT DETECTED Final   Klebsiella oxytoca NOT DETECTED NOT DETECTED Final   Klebsiella pneumoniae NOT DETECTED NOT DETECTED Final   Proteus species NOT DETECTED NOT DETECTED Final   Serratia marcescens NOT DETECTED NOT DETECTED Final   Carbapenem resistance NOT DETECTED NOT DETECTED Final   Haemophilus influenzae NOT DETECTED NOT DETECTED Final     Neisseria meningitidis NOT DETECTED NOT DETECTED Final   Pseudomonas aeruginosa NOT DETECTED NOT DETECTED Final   Candida albicans NOT DETECTED NOT DETECTED Final   Candida glabrata NOT DETECTED NOT DETECTED Final   Candida krusei NOT DETECTED NOT DETECTED Final   Candida parapsilosis NOT DETECTED NOT DETECTED Final   Candida tropicalis NOT DETECTED NOT DETECTED Final    Comment: Performed at [facility], [place] [address], [place], [place] [address]  C difficile quick scan w PCR reflex     Status: None   Collection Time: 08/04/17  1:59 AM  Result Value Ref Range Status   C Diff antigen NEGATIVE NEGATIVE Final   C Diff toxin NEGATIVE NEGATIVE Final   C Diff interpretation No C. difficile detected.  Final    Comment: Performed at [facility], [place] [address] [place], [place] [address]    Radiology Reports Dg Chest 2 View  Result Date: 08/03/2017 CLINICAL DATA:  Weakness.  Lightheadedness. EXAM: CHEST - 2 VIEW COMPARISON:  Radiograph 11/01/2013 FINDINGS: Post median sternotomy. Mild cardiomegaly with aortic atherosclerosis. No pulmonary edema. Chronic elevation of right hemidiaphragm. No focal airspace disease, pleural effusion or pneumothorax. No acute osseous abnormalities. IMPRESSION: Mild cardiomegaly and aortic atherosclerosis.  No acute findings. Electronically Signed   By: Jeb Levering M.D.   On: 08/03/2017 20:59     CBC Recent Labs  Lab 08/03/17 2024 08/04/17 0228  WBC 9.3 7.1  HGB 10.5* 10.0*  HCT 34.0* 32.1*  PLT 145* 139*  MCV 89.5 89.7  MCH 27.6 27.9  MCHC 30.9 31.2  RDW 16.0* 16.1*  LYMPHSABS 1.3  --   MONOABS 0.5  --   EOSABS 0.0  --   BASOSABS 0.0  --     Chemistries  Recent Labs  Lab 08/03/17 2024 08/04/17 0225  NA 136  --   K 3.8  --   CL 106  --   CO2 20*  --   GLUCOSE 130*  --   BUN 40*  --   CREATININE 2.77*  --   CALCIUM 8.7*  --   MG  --  2.1  AST 28  --   ALT 17  --   ALKPHOS 82  --   BILITOT 1.1  --     ------------------------------------------------------------------------------------------------------------------ No results for input(s): CHOL, HDL, LDLCALC, TRIG, CHOLHDL, LDLDIRECT in the last 72 hours.  Lab Results  Component Value Date   HGBA1C 7.0 (H) 07/25/2017   ------------------------------------------------------------------------------------------------------------------ Recent Labs    08/04/17 0237  TSH 0.886   ------------------------------------------------------------------------------------------------------------------ No results for input(s): VITAMINB12, FOLATE, FERRITIN, TIBC, IRON, RETICCTPCT in the last 72 hours.  Coagulation profile Recent Labs  Lab 08/03/17 2024  INR 1.18    No results for input(s): DDIMER in the last 72 hours.  Cardiac Enzymes Recent Labs  Lab 08/04/17 0228 08/04/17 0806  TROPONINI 0.16* 0.19*   ------------------------------------------------------------------------------------------------------------------ No results found for: BNP   Roxan Hockey M.D on 08/04/2017 at 3:04 PM  Between 7am to 7pm - Pager - [phone]  After 7pm go to [email or web address] - password [email or web address]  [facility] -  Office  [phone]   Voice Recognition Viviann Spare dictation system was used to create this note, attempts have been made to correct errors. Please contact the author with questions and/or clarifications.

## 2017-08-04 NOTE — H&P (Addendum)
History and Physical    Allen Holloway:[id number] DOB: Apr 03, 1927 DOA: 08/03/2017  PCP: Tammi Sou, MD   Patient coming from: Home   Chief Complaint: Weakness, burning with urination  HPI: Allen Holloway is a 84 y.o. male with medical history significant for hypothyroidism, CKD3, HTN, Diastolic CHF, COPD, who was brought to the ED via EMS reports of lightheadedness and inability to get up from the commode today.  Patient reports dysuria for the past 2 days.  Patient also reports 3 days of loose stools 3-4 times daily.  Nonbloody.  No abdominal pain.  No vomiting.  Also endorses poor p.o. intake over the past few days.  She has been compliant with this 40 mg daily dosing of Lasix. Patient denies shortness of breath, has a chronic unchanged cough secondary to COPD, but has been sneezing today.  Denies fevers or chills.  ED Course: Temperature 100.9 in ED. Mild intake and intermittent tachypnea to 27.  O2 sats greater than 92% on room air.  WBC normal 9.3.  Creatinine about baseline 2.77.  Elevated I. Stat trop- 0.08.  Lactic acid 1.08.  EKG - Atria fib.  Chest x-ray negative for acute abnormality.  UA-negative nitrites leukocytes and many bacteria. Patient was given 500 no bolus in the ED, 1g ceftriaxone. EDP was concern for possible early sepsis hence hospitalist was called to admit for UTI generalized weakness.  Review of Systems: As per HPI otherwise 10 point review of systems negative.   Past Medical History:  Diagnosis Date  . CAD (coronary artery disease)   . Carotid bruit    60-79% bilat In carot sten, stable over serial exams.  04/2015 stable 63-14% RICA, 97-02% LICA.  Repeat 1 yr.  . Cholelithiasis   . Chronic diastolic heart failure ([place])    CHF clinic  . Chronic renal insufficiency, stage III (moderate) (HCC)    Stage III/IV--GFR 25-30 ml/min.  . 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  Avoid BB/verap/dilt  . COPD (chronic obstructive pulmonary disease) ([place])   . Diabetes mellitus without complication ([place])    09/3783 HbA1c 6.8%  . Diverticulosis   . GERD (gastroesophageal reflux disease)   . Hyperlipidemia   . Hypertension   . Hypothyroidism   . Lumbago   . Osteoarthritis   . Right hip pain    Trochanteric bursitis: Dr. Alvan Dame injected this 09/2015.  Hip x-rays normal at Dr. Aurea Graff office.  . Venous insufficiency of both lower extremities     Past Surgical History:  Procedure Laterality Date  . APPENDECTOMY    . CORONARY ARTERY BYPASS GRAFT  2005   LIMA-LAD,SVG-PDA,SVG-D1,SVG-OM.  Myoview (9/08) showed EF63%,no scaror ischemia.  . TRANSTHORACIC ECHOCARDIOGRAM  01/29/2015; 03/08/17   2016 EF 55-60%, normal LV function and wall motion.  Mod tricusp regurg.  2018 EF 55-60%, normal wall motion, mod tricusp regurg.  Pulmonary arteries: Systolic pressure was moderately increased (secondary to DD).     reports that he has quit smoking. He has never used smokeless tobacco. He reports that he does not drink alcohol or use drugs.  Allergies  Allergen Reactions  . Amlodipine     edema  . Benazepril Hcl     REACTION: elev K    Family History  Problem Relation Age of Onset  . Hyperlipidemia Unknown   . Hypertension Unknown   .  Colon cancer Neg Hx     Prior to Admission medications   Medication Sig Start Date End Date Taking? Authorizing Provider  aspirin 325 MG tablet Take 162.5 mg by mouth daily. TAKE 1/2 TAB  01/09/12  Yes Plotnikov, Evie Lacks, MD  atorvastatin (LIPITOR) 20 MG tablet Take 10 mg by mouth daily.   Yes [provider]  Cholecalciferol (VITAMIN D3) 1000 UNITS tablet Take 1,000 Units by mouth daily.     Yes [provider]  Coenzyme Q10 200 MG TABS Take 1 tablet by mouth daily.  05/05/11  Yes Larey Dresser, MD  furosemide (LASIX) 40 MG tablet Take 40 mg by mouth daily.    Yes [provider]  Garlic 2993 MG CAPS Take 1 capsule by mouth daily.   Yes [provider]  Astrid Drafts 350 MG CAPS Take 1 capsule by mouth daily.   Yes [provider]  levothyroxine (SYNTHROID, LEVOTHROID) 100 MCG tablet Take 100 mcg by mouth daily.     Yes [provider]  omeprazole (PRILOSEC) 20 MG capsule Take 20 mg by mouth daily.    Yes [provider]    Physical Exam: Vitals:   08/03/17 2030 08/03/17 2032 08/03/17 2102 08/03/17 2315  BP: 117/60  (!) 121/59 122/67  Pulse: 88  90 94  Resp: (!) 21  20 (!) 21  Temp:      TempSrc:      SpO2: 96% 96% 95% 98%    Constitutional: NAD, calm, comfortable Vitals:   08/03/17 2030 08/03/17 2032 08/03/17 2102 08/03/17 2315  BP: 117/60  (!) 121/59 122/67  Pulse: 88  90 94  Resp: (!) 21  20 (!) 21  Temp:      TempSrc:      SpO2: 96% 96% 95% 98%   Eyes: PERRL, lids and conjunctivae normal ENMT: Mucous membranes are dry. Posterior pharynx clear of any exudate or lesions.Normal dentition.  Neck: normal, supple, no masses, no thyromegaly Respiratory: clear to auscultation bilaterally, no wheezing, no crackles. Normal respiratory effort. No accessory muscle use.  Cardiovascular: Regular rate and rhythm, no murmurs / rubs / gallops. 1+ unchanged chronic pitting pedal edema. 2+ pedal pulses.  Abdomen: no tenderness, no masses palpated. No hepatosplenomegaly. Bowel sounds positive.  Musculoskeletal: no clubbing / cyanosis. No joint deformity upper and lower extremities. Good ROM, no contractures. Normal muscle tone.  Skin: no rashes, lesions, ulcers. No induration Neurologic: CN 2-12 grossly intact.  Strength 5/5 in all 4.  Psychiatric: Normal judgment and insight. Alert and oriented x 3. Normal mood.   Labs on Admission: I have personally reviewed following labs and imaging studies  CBC: Recent Labs  Lab 08/03/17 2024  WBC 9.3  NEUTROABS 7.6  HGB 10.5*  HCT 34.0*  MCV 89.5  PLT 145*    Basic Metabolic Panel: Recent Labs  Lab 08/03/17 2024  NA 136  K 3.8  CL 106  CO2 20*  GLUCOSE 130*  BUN 40*  CREATININE 2.77*  CALCIUM 8.7*   Liver Function Tests: Recent Labs  Lab 08/03/17 2024  AST 28  ALT 17  ALKPHOS 82  BILITOT 1.1  PROT 6.6  ALBUMIN 3.5   Coagulation Profile: Recent Labs  Lab 08/03/17 2024  INR 1.18   Urine analysis:    Component Value Date/Time   COLORURINE YELLOW 08/03/2017 2021   APPEARANCEUR HAZY (A) 08/03/2017 2021   LABSPEC 1.016 08/03/2017 2021   PHURINE 5.0 08/03/2017 2021   GLUCOSEU NEGATIVE 08/03/2017 2021  GLUCOSEU NEGATIVE 12/31/2013 0849   HGBUR MODERATE (A) 08/03/2017 2021   BILIRUBINUR NEGATIVE 08/03/2017 2021   KETONESUR NEGATIVE 08/03/2017 2021   PROTEINUR 100 (A) 08/03/2017 2021   UROBILINOGEN 0.2 12/31/2013 0849   NITRITE POSITIVE (A) 08/03/2017 2021   LEUKOCYTESUR LARGE (A) 08/03/2017 2021    Radiological Exams on Admission: Dg Chest 2 View  Result Date: 08/03/2017 CLINICAL DATA:  Weakness.  Lightheadedness. EXAM: CHEST - 2 VIEW COMPARISON:  Radiograph 11/01/2013 FINDINGS: Post median sternotomy. Mild cardiomegaly with aortic atherosclerosis. No pulmonary edema. Chronic elevation of right hemidiaphragm. No focal airspace disease, pleural effusion or pneumothorax. No acute osseous abnormalities. IMPRESSION: Mild cardiomegaly and aortic atherosclerosis.  No acute findings. Electronically Signed   By: Jeb Levering M.D.   On: 08/03/2017 20:59    EKG: Independently reviewed.  Atria Fibrillation.   Assessment/Plan Active Problems:   Hypothyroidism   Essential hypertension   Coronary atherosclerosis   Chronic renal insufficiency, stage 3 (moderate) (HCC)   Chronic diastolic CHF (congestive heart failure) (HCC)   UTI (urinary tract infection)  Generalized weakness- likely 2/2 diarrhea, and UTI. IV Ceftriaxone started in ED. lactic acid normal 1.08.  Two-view chest x-ray negative for acute abnormality.  Positive Orthostatic vitals with increasing pulse > 20. 537ml bolus given in ED. -Follow up urine and blood cultures drawn in ED - PT eval -IV ceftriaxone 1 g daily started in ED - gentle hydration Ns 75cc/hr x 12 hrs  UTI-dysuria, temp 100.9.  UA suggestive .  -f/u Urine cultures drawn in ED - 1g IV ceftriaxone  Diarrhea- X 3 days. No abdominal pain. WBC- 9.3. Stable lytes and Cr.  - Stool for c.diff  Diastolic CHF- dry to euvolemic. last echo 02/2017- EF 55-60%, elevated pulmonary artery pressure 56 thought 2/2 diastolic heart failure. -Hold home Lasix  Elevated i-STAT troponin- 0.08. Denies chest pain or SOB. EKG shows atria fib. -Trend troponins  Hgb Drop- 10.5 today, baseline 12-13. Denies blood stools or melena. - CBC a.m  New onset atrial fibrillation- on EKG, likely paroxysmal in the setting of acute infection. Holter monitor 01/19/2016- type 1 2nd degree AV block, pacemaker was not indicated. CHADsVASc score at least 5. - will need tele monitoring to determine if persistent, will move to tele bed (Talked to bed control). - Trend trops  - EKG a.m - TSH - May need cardiology eval. - Check Mag  DVT prophylaxis: Lovenox Code Status: Full Family Communication: None at bedside Disposition Plan: 1- 2 days  Consults called: None Admission status: Inpt, tele.   Bethena Roys MD [facility] Pager [phone] From 6PM-2AM.  Otherwise please contact night-coverage [email or web address] Password [facility]  08/04/2017, 12:07 AM

## 2017-08-04 NOTE — Plan of Care (Signed)
  Problem: Clinical Measurements: Goal: Ability to maintain clinical measurements within normal limits will improve Outcome: Progressing Goal: Will remain free from infection Outcome: Progressing Goal: Diagnostic test results will improve Outcome: Progressing Goal: Respiratory complications will improve Outcome: Progressing Goal: Cardiovascular complication will be avoided Outcome: Progressing   Problem: Coping: Goal: Level of anxiety will decrease Outcome: Progressing   Problem: Elimination: Goal: Will not experience complications related to bowel motility Outcome: Progressing Goal: Will not experience complications related to urinary retention Outcome: Progressing   Problem: Pain Managment: Goal: General experience of comfort will improve Outcome: Progressing   Problem: Safety: Goal: Ability to remain free from injury will improve Outcome: Progressing   Problem: Skin Integrity: Goal: Risk for impaired skin integrity will decrease Outcome: Progressing   

## 2017-08-04 NOTE — Progress Notes (Signed)
PHARMACY - PHYSICIAN COMMUNICATION CRITICAL VALUE ALERT - BLOOD CULTURE IDENTIFICATION (BCID)  Allen Holloway is an 83 y.o. male who presented to [facility] on 08/03/2017 with a chief complaint of weakness, dizziness.  Assessment:  Currently on Rocephin for presumed UTI.   Name of physician (or Provider) Contacted: Maurene Capes  Current antibiotics: Rocephin 1gm IV q24h  Changes to prescribed antibiotics recommended:  Increased Rocephin to 2gm IV q24h.    Results for orders placed or performed during the hospital encounter of 08/03/17  Blood Culture ID Panel (Reflexed) (Collected: 08/03/2017  8:24 PM)  Result Value Ref Range   Enterococcus species NOT DETECTED NOT DETECTED   Listeria monocytogenes NOT DETECTED NOT DETECTED   Staphylococcus species NOT DETECTED NOT DETECTED   Staphylococcus aureus NOT DETECTED NOT DETECTED   Streptococcus species NOT DETECTED NOT DETECTED   Streptococcus agalactiae NOT DETECTED NOT DETECTED   Streptococcus pneumoniae NOT DETECTED NOT DETECTED   Streptococcus pyogenes NOT DETECTED NOT DETECTED   Acinetobacter baumannii NOT DETECTED NOT DETECTED   Enterobacteriaceae species DETECTED (A) NOT DETECTED   Enterobacter cloacae complex NOT DETECTED NOT DETECTED   Escherichia coli NOT DETECTED NOT DETECTED   Klebsiella oxytoca NOT DETECTED NOT DETECTED   Klebsiella pneumoniae NOT DETECTED NOT DETECTED   Proteus species NOT DETECTED NOT DETECTED   Serratia marcescens NOT DETECTED NOT DETECTED   Carbapenem resistance NOT DETECTED NOT DETECTED   Haemophilus influenzae NOT DETECTED NOT DETECTED   Neisseria meningitidis NOT DETECTED NOT DETECTED   Pseudomonas aeruginosa NOT DETECTED NOT DETECTED   Candida albicans NOT DETECTED NOT DETECTED   Candida glabrata NOT DETECTED NOT DETECTED   Candida krusei NOT DETECTED NOT DETECTED   Candida parapsilosis NOT DETECTED NOT DETECTED   Candida tropicalis NOT DETECTED NOT DETECTED    Biagio Borg 08/04/2017  1:01 PM

## 2017-08-04 NOTE — Progress Notes (Signed)
Lovenox per Pharmacy for DVT Prophylaxis    Pharmacy has been consulted from dosing enoxaparin (lovenox) in this patient for DVT prophylaxis.  The pharmacist has reviewed pertinent labs (Hgb _10.5__; PLT_145__), patient weight (_91__kg) and renal function (CrCl_19__mL/min) and decided that enoxaparin _30_mg SQ Q24Hrs is appropriate for this patient.  The pharmacy department will sign off at this time.  Please reconsult pharmacy if status changes or for further issues.  Thank you  Cyndia Diver PharmD, BCPS  08/04/2017, 2:21 AM

## 2017-08-05 NOTE — Progress Notes (Signed)
Patient Demographics:    Allen Holloway, is a 84 y.o. male, DOB - [birth year], JKD:[id number]  Admit date - 08/03/2017   Admitting Physician Ejiroghene Arlyce Dice, MD  Outpatient Primary MD for the patient is McGowen, Adrian Blackwater, MD  LOS - 1   Chief Complaint  Patient presents with  . Dizziness  . Weakness        Subjective:    Allen Holloway today has no fevers, no emesis,  No chest pain, no dysuria, no fevers, no chills, no chest pains, states he feels  better  Assessment  & Plan :    Active Problems:   Hypothyroidism   Essential hypertension   Coronary atherosclerosis   COPD (chronic obstructive pulmonary disease) (HCC)   Chronic renal insufficiency, stage 3 (moderate) (HCC)   Chronic diastolic CHF (congestive heart failure) (HCC)   UTI (urinary tract infection)   Atrial fibrillation, new onset [facility])  Brief summary  84 y.o. male with medical history significant for hypothyroidism, CKD3, HTN, Diastolic CHF, COPD admitted on 08/03/2017 with concerns about fevers, diarrhea, generalized weakness and dizziness and presumed UTI.. Admission blood AND Urine cultures from 08/03/2017 with Enterobacteriaceae species, currently on Rocephin pending final ID and sensitivity    Plan:- 1)GNR/Enterobacteriaceae Bacteremia-secondary to presumed urinary source/UTI, blood cx from 08/03/17 with Enterobacteriaceae species,c/n  Rocephin to 2 g daily pending final ID and sensitivity , patient was orthostatic on admission, hemodynamics have improved with IV fluids  2)Diarrhea-improved, stool for C. difficile is negative  3)Afib --- apparently new onset A. Fib, CHADSVASc at least 5, TSH and magnesium are within normal limits, borderline elevation of troponin noted, check echocardiogram, hold off on Full anticoagulation given dropping hemoglobin, continue aspirin  4)Acute Anemia-- ??? Etiology, baseline hemoglobin 12 to 13,  hemoglobin is down to 10, check stool occult blood , MCV and MCH are normal, however her RDW is elevated, ????  Anemia of CKD, baseline creatinine around 3  5)CKD IV-review of records reveals baseline creatinine around 3, creatinine currently 2.7, avoid nephrotoxic agents  6)Hypothyroidism-TSH is normal, continue levothyroxine 100 mcg daily  7)HFpEF-last known EF 55-60%, history of chronic diastolic dysfunction CHF, no acute exacerbation at this time, clinically does not appear volume overloaded, Lasix on hold, await repeat echo  8)E coli UTI-continue IV Rocephin pending final sensitivities  9)Enterobacteriaceae Sepsis-secondary to #1 and #8 above, patient met sepsis criteria on admission with fever, leukocytosis, tachypnea findings consistent with UTI and hemodynamic concerns with orthostatic findings of tachycardia with postural  Change, hemodynamics has improved with IV fluids and IV antibiotics treat gram-negative sepsis as above in #1 and #8   Code Status : Full code  Disposition Plan  : home  DVT Prophylaxis  :  Lovenox    Lab Results  Component Value Date   PLT 139 (L) 08/04/2017    Inpatient Medications  Scheduled Meds: . aspirin  162.5 mg Oral Daily  . atorvastatin  10 mg Oral Daily  . enoxaparin (LOVENOX) injection  30 mg Subcutaneous Q24H  . levothyroxine  100 mcg Oral QAC breakfast  . pantoprazole  40 mg Oral Daily   Continuous Infusions: . cefTRIAXone (ROCEPHIN)  IV Stopped (08/05/17 1645)   PRN Meds:.acetaminophen, ondansetron **OR** ondansetron (ZOFRAN) IV    Anti-infectives (  From admission, onward)   Start     Dose/Rate Route Frequency Ordered Stop   08/04/17 2100  cefTRIAXone (ROCEPHIN) 1 g in sodium chloride 0.9 % 100 mL IVPB  Status:  Discontinued     1 g 200 mL/hr over 30 Minutes Intravenous Every 24 hours 08/04/17 0006 08/04/17 1258   08/04/17 1600  cefTRIAXone (ROCEPHIN) 2 g in sodium chloride 0.9 % 100 mL IVPB     2 g 200 mL/hr over 30 Minutes  Intravenous Every 24 hours 08/04/17 1258     08/03/17 2145  cefTRIAXone (ROCEPHIN) 1 g in sodium chloride 0.9 % 100 mL IVPB     1 g 200 mL/hr over 30 Minutes Intravenous  Once 08/03/17 2133 08/03/17 2255        Objective:   Vitals:   08/04/17 2103 08/05/17 0633 08/05/17 0928 08/05/17 1320  BP: 121/64 129/64 124/65 131/70  Pulse: 85 75 89 80  Resp: 18 18  19   Temp: 99 F (37.2 C) 98.7 F (37.1 C)  97.8 F (36.6 C)  TempSrc: Oral Oral  Oral  SpO2: 96% 98% 98% 98%  Weight:      Height:        Wt Readings from Last 3 Encounters:  08/04/17 91.2 kg (201 lb 1 oz)  07/25/17 93.2 kg (205 lb 6 oz)  05/01/17 91.4 kg (201 lb 8 oz)     Intake/Output Summary (Last 24 hours) at 08/05/2017 1705 Last data filed at 08/05/2017 0400 Gross per 24 hour  Intake 480 ml  Output 281 ml  Net 199 ml     Physical Exam  Gen:- Awake Alert,  In no apparent distress  HEENT:- [place].AT, No sclera icterus Neck-Supple Neck,No JVD,.  Lungs-  CTAB , good air movement CV- S1, S2 normal Abd-  +ve B.Sounds, Abd Soft, No tenderness, no CVA tenderness Extremity/Skin:- 1 +  edema,   good pulses Psych-affect is appropriate, oriented x3 Neuro-no new focal deficits, no tremors   Data Review:   Micro Results Recent Results (from the past 240 hour(s))  Urine culture     Status: Abnormal (Preliminary result)   Collection Time: 08/03/17  8:21 PM  Result Value Ref Range Status   Specimen Description   Final    URINE, RANDOM Performed at [facility] [address], [place], [place] [address]    Special Requests   Final    NONE Performed at [facility], [place] [address], [place], [place] [address]    Culture >=100,000 COLONIES/mL ESCHERICHIA COLI (A)  Final   Report Status PENDING  Incomplete  Blood Culture (routine x 2)     Status: Abnormal (Preliminary result)   Collection Time: 08/03/17  8:24 PM  Result Value Ref Range Status   Specimen Description   Final     BLOOD LEFT ANTECUBITAL Performed at [facility] [address] [place], [place] [address]    Special Requests   Final    BOTTLES DRAWN AEROBIC AND ANAEROBIC Blood Culture adequate volume Performed at [facility] [address] [place], [place] [address]    Culture  Setup Time   Final    GRAM NEGATIVE RODS IN BOTH AEROBIC AND ANAEROBIC BOTTLES CRITICAL RESULT CALLED TO, READ BACK BY AND VERIFIED WITH: TPICKERING,PHARMD @1249  08/04/17 BY LHOWARD Performed at [facility], [place] [address], [place], [place] [address]    Culture SALMONELLA SPECIES (A)  Final   Report Status PENDING  Incomplete  Blood Culture (routine x 2)     Status: Abnormal (Preliminary result)   Collection Time: 08/03/17  8:24 PM  Result Value Ref Range Status   Specimen Description   Final    BLOOD RIGHT ANTECUBITAL Performed at [facility] [address] [place], [place] [address]    Special Requests   Final    BOTTLES DRAWN AEROBIC AND ANAEROBIC Blood Culture adequate volume Performed at [facility] [address], [place], [place] [address]    Culture  Setup Time   Final    GRAM NEGATIVE RODS IN BOTH AEROBIC AND ANAEROBIC BOTTLES CRITICAL VALUE NOTED.  VALUE IS CONSISTENT WITH PREVIOUSLY REPORTED AND CALLED VALUE. Performed at [facility], [place] [address] [place], [place] [address]    Culture SALMONELLA SPECIES (A)  Final   Report Status PENDING  Incomplete  Blood Culture ID Panel (Reflexed)     Status: Abnormal   Collection Time: 08/03/17  8:24 PM  Result Value Ref Range Status   Enterococcus species NOT DETECTED NOT DETECTED Final   Listeria monocytogenes NOT DETECTED NOT DETECTED Final   Staphylococcus species NOT DETECTED NOT DETECTED Final   Staphylococcus aureus NOT DETECTED NOT DETECTED Final   Streptococcus species NOT DETECTED NOT DETECTED Final   Streptococcus agalactiae NOT DETECTED NOT DETECTED  Final   Streptococcus pneumoniae NOT DETECTED NOT DETECTED Final   Streptococcus pyogenes NOT DETECTED NOT DETECTED Final   Acinetobacter baumannii NOT DETECTED NOT DETECTED Final   Enterobacteriaceae species DETECTED (A) NOT DETECTED Final    Comment: Enterobacteriaceae represent a large family of gram negative bacteria, not a single organism. Refer to culture for further identification. CRITICAL RESULT CALLED TO, READ BACK BY AND VERIFIED WITH: TPICKERING,PHARMD @1249  08/04/17 BY LHOWARD    Enterobacter cloacae complex NOT DETECTED NOT DETECTED Final   Escherichia coli NOT DETECTED NOT DETECTED Final   Klebsiella oxytoca NOT DETECTED NOT DETECTED Final   Klebsiella pneumoniae NOT DETECTED NOT DETECTED Final   Proteus species NOT DETECTED NOT DETECTED Final   Serratia marcescens NOT DETECTED NOT DETECTED Final   Carbapenem resistance NOT DETECTED NOT DETECTED Final   Haemophilus influenzae NOT DETECTED NOT DETECTED Final   Neisseria meningitidis NOT DETECTED NOT DETECTED Final   Pseudomonas aeruginosa NOT DETECTED NOT DETECTED Final   Candida albicans NOT DETECTED NOT DETECTED Final   Candida glabrata NOT DETECTED NOT DETECTED Final   Candida krusei NOT DETECTED NOT DETECTED Final   Candida parapsilosis NOT DETECTED NOT DETECTED Final   Candida tropicalis NOT DETECTED NOT DETECTED Final    Comment: Performed at [facility], [place] [address] [place], [place] [address]  C difficile quick scan w PCR reflex     Status: None   Collection Time: 08/04/17  1:59 AM  Result Value Ref Range Status   C Diff antigen NEGATIVE NEGATIVE Final   C Diff toxin NEGATIVE NEGATIVE Final   C Diff interpretation No C. difficile detected.  Final    Comment: Performed at [facility], [place] [address], [place], [place] [address]    Radiology Reports Dg Chest 2 View  Result Date: 08/03/2017 CLINICAL DATA:  Weakness.  Lightheadedness. EXAM: CHEST - 2 VIEW COMPARISON:  Radiograph  11/01/2013 FINDINGS: Post median sternotomy. Mild cardiomegaly with aortic atherosclerosis. No pulmonary edema. Chronic elevation of right hemidiaphragm. No focal airspace disease, pleural effusion or pneumothorax. No acute osseous abnormalities. IMPRESSION: Mild cardiomegaly and aortic atherosclerosis.  No acute  findings. Electronically Signed   By: Jeb Levering M.D.   On: 08/03/2017 20:59     CBC Recent Labs  Lab 08/03/17 2024 08/04/17 0228  WBC 9.3 7.1  HGB 10.5* 10.0*  HCT 34.0* 32.1*  PLT 145* 139*  MCV 89.5 89.7  MCH 27.6 27.9  MCHC 30.9 31.2  RDW 16.0* 16.1*  LYMPHSABS 1.3  --   MONOABS 0.5  --   EOSABS 0.0  --   BASOSABS 0.0  --     Chemistries  Recent Labs  Lab 08/03/17 2024 08/04/17 0225  NA 136  --   K 3.8  --   CL 106  --   CO2 20*  --   GLUCOSE 130*  --   BUN 40*  --   CREATININE 2.77*  --   CALCIUM 8.7*  --   MG  --  2.1  AST 28  --   ALT 17  --   ALKPHOS 82  --   BILITOT 1.1  --    ------------------------------------------------------------------------------------------------------------------ No results for input(s): CHOL, HDL, LDLCALC, TRIG, CHOLHDL, LDLDIRECT in the last 72 hours.  Lab Results  Component Value Date   HGBA1C 7.0 (H) 07/25/2017   ------------------------------------------------------------------------------------------------------------------ Recent Labs    08/04/17 0237  TSH 0.886   ------------------------------------------------------------------------------------------------------------------ No results for input(s): VITAMINB12, FOLATE, FERRITIN, TIBC, IRON, RETICCTPCT in the last 72 hours.  Coagulation profile Recent Labs  Lab 08/03/17 2024  INR 1.18    No results for input(s): DDIMER in the last 72 hours.  Cardiac Enzymes Recent Labs  Lab 08/04/17 0228 08/04/17 0806  TROPONINI 0.16* 0.19*    ------------------------------------------------------------------------------------------------------------------ No results found for: BNP   Roxan Hockey M.D on 08/05/2017 at 5:05 PM  Between 7am to 7pm - Pager - [phone]  After 7pm go to [email or web address] - password [email or web address]  [facility] -  Office  [phone]   Voice Recognition Viviann Spare dictation system was used to create this note, attempts have been made to correct errors. Please contact the author with questions and/or clarifications.

## 2017-08-05 NOTE — Plan of Care (Signed)
Patient without complaint, VSS.  Wife in to visit.  Patient and wife hopeful to go home tomorrow, awaiting blood and urine cultures to finalize for sensitivities.

## 2017-08-06 ENCOUNTER — Inpatient Hospital Stay (HOSPITAL_COMMUNITY): Payer: Medicare Other

## 2017-08-06 DIAGNOSIS — Z87891 Personal history of nicotine dependence: Secondary | ICD-10-CM

## 2017-08-06 DIAGNOSIS — I361 Nonrheumatic tricuspid (valve) insufficiency: Secondary | ICD-10-CM

## 2017-08-06 DIAGNOSIS — R300 Dysuria: Secondary | ICD-10-CM

## 2017-08-06 DIAGNOSIS — A029 Salmonella infection, unspecified: Secondary | ICD-10-CM

## 2017-08-06 DIAGNOSIS — K5790 Diverticulosis of intestine, part unspecified, without perforation or abscess without bleeding: Secondary | ICD-10-CM

## 2017-08-06 DIAGNOSIS — I5032 Chronic diastolic (congestive) heart failure: Secondary | ICD-10-CM

## 2017-08-06 DIAGNOSIS — N183 Chronic kidney disease, stage 3 (moderate): Secondary | ICD-10-CM

## 2017-08-06 DIAGNOSIS — J449 Chronic obstructive pulmonary disease, unspecified: Secondary | ICD-10-CM

## 2017-08-06 DIAGNOSIS — B9620 Unspecified Escherichia coli [E. coli] as the cause of diseases classified elsewhere: Secondary | ICD-10-CM

## 2017-08-06 DIAGNOSIS — I129 Hypertensive chronic kidney disease with stage 1 through stage 4 chronic kidney disease, or unspecified chronic kidney disease: Secondary | ICD-10-CM

## 2017-08-06 DIAGNOSIS — R7881 Bacteremia: Secondary | ICD-10-CM

## 2017-08-06 DIAGNOSIS — N390 Urinary tract infection, site not specified: Secondary | ICD-10-CM

## 2017-08-06 DIAGNOSIS — E039 Hypothyroidism, unspecified: Secondary | ICD-10-CM

## 2017-08-06 DIAGNOSIS — Z888 Allergy status to other drugs, medicaments and biological substances status: Secondary | ICD-10-CM

## 2017-08-06 LAB — BASIC METABOLIC PANEL
Anion gap: 9 (ref 5–15)
BUN: 31 mg/dL — AB (ref 6–20)
CHLORIDE: 110 mmol/L (ref 101–111)
CO2: 20 mmol/L — AB (ref 22–32)
CREATININE: 2.45 mg/dL — AB (ref 0.61–1.24)
Calcium: 8.7 mg/dL — ABNORMAL LOW (ref 8.9–10.3)
GFR calc Af Amer: 25 mL/min — ABNORMAL LOW (ref >60)
GFR calc non Af Amer: 22 mL/min — ABNORMAL LOW (ref >60)
GLUCOSE: 125 mg/dL — AB (ref 65–99)
Potassium: 4 mmol/L (ref 3.5–5.1)
SODIUM: 139 mmol/L (ref 135–145)

## 2017-08-06 LAB — ECHOCARDIOGRAM COMPLETE
Height: 68 in
Weight: 3216.95 oz

## 2017-08-06 LAB — CBC
HEMATOCRIT: 32.6 % — AB (ref 39.0–52.0)
HEMOGLOBIN: 10.1 g/dL — AB (ref 13.0–17.0)
MCH: 27.5 pg (ref 26.0–34.0)
MCHC: 31 g/dL (ref 30.0–36.0)
MCV: 88.8 fL (ref 78.0–100.0)
Platelets: 139 10*3/uL — ABNORMAL LOW (ref 150–400)
RBC: 3.67 MIL/uL — ABNORMAL LOW (ref 4.22–5.81)
RDW: 16.3 % — ABNORMAL HIGH (ref 11.5–15.5)
WBC: 6.7 10*3/uL (ref 4.0–10.5)

## 2017-08-06 LAB — CULTURE, BLOOD (ROUTINE X 2): SPECIAL REQUESTS: ADEQUATE

## 2017-08-06 LAB — URINE CULTURE: Culture: >=100000 — AB

## 2017-08-06 MED ORDER — CIPROFLOXACIN HCL 500 MG PO TABS: 500.0000 mg | ORAL_TABLET | Freq: Every day | ORAL | Status: DC

## 2017-08-06 MED ORDER — CIPROFLOXACIN IN D5W 400 MG/200ML IV SOLN
400.0000 mg | Freq: Once | INTRAVENOUS | Status: AC
  Administered 2017-08-06: 400 mg via INTRAVENOUS
  Filled 2017-08-06: qty 200

## 2017-08-06 NOTE — Plan of Care (Signed)

## 2017-08-06 NOTE — Evaluation (Signed)
Physical Therapy Evaluation Patient Details Name: Allen Holloway MRN: [id number] DOB: 06/17/1926 Today's Date: 08/06/2017   History of Present Illness  84 yo male admitted with UTI. Hx of COPD, CHF, A fib, CKD, R hip pain  Clinical Impression  On eval, pt required Min assist for mobility. He walked ~125 feet with a RW. Couple of instances of LOB during session. Provided cues for safety during session. Discussed d/c plan-pt stated he will return home. Recommend HHPT f/u for general strength and balance training, if pt is agreeable. Will follow during hospital stay.     Follow Up Recommendations Home health PT    Equipment Recommendations  None recommended by PT    Recommendations for Other Services       Precautions / Restrictions Precautions Precautions: Fall Restrictions Weight Bearing Restrictions: No      Mobility  Bed Mobility               General bed mobility comments: oob in recliner  Transfers Overall transfer level: Needs assistance Equipment used: Rolling walker (2 wheeled) Transfers: Sit to/from Stand Sit to Stand: Min assist         General transfer comment: Assist to steady. LOB x 1 with initial standing and x 1 when turning back to look at recliner  Ambulation/Gait Ambulation/Gait assistance: Min assist Ambulation Distance (Feet): 125 Feet Assistive device: Rolling walker (2 wheeled) Gait Pattern/deviations: Trendelenburg;Step-through pattern     General Gait Details: Mildly unsteady at times. Cues for safety, safe use of walker. Pt denied dizziness/lightheadedness.   Stairs            Wheelchair Mobility    Modified Rankin (Stroke Patients Only)       Balance Overall balance assessment: Needs assistance         Standing balance support: Bilateral upper extremity supported Standing balance-Leahy Scale: Poor Standing balance comment: requires RW for safety at this time                             Pertinent  Vitals/Pain Pain Assessment: No/denies pain    Home Living Family/patient expects to be discharged to:: Private residence Living Arrangements: Spouse/significant other Available Help at Discharge: Family Type of Home: House Home Access: Stairs to enter Entrance Stairs-Rails: Psychiatric nurse of Steps: 2 Home Layout: One level Home Equipment: Environmental consultant - 2 wheels;Cane - single point;Crutches      Prior Function Level of Independence: Independent with assistive device(s)         Comments: uses cane     Hand Dominance        Extremity/Trunk Assessment   Upper Extremity Assessment Upper Extremity Assessment: Overall WFL for tasks assessed    Lower Extremity Assessment Lower Extremity Assessment: Generalized weakness    Cervical / Trunk Assessment Cervical / Trunk Assessment: Normal  Communication   Communication: No difficulties  Cognition Arousal/Alertness: Awake/alert Behavior During Therapy: WFL for tasks assessed/performed Overall Cognitive Status: Within Functional Limits for tasks assessed                                        General Comments      Exercises     Assessment/Plan    PT Assessment Patient needs continued PT services  PT Problem List Decreased strength;Decreased mobility;Decreased balance;Decreased safety awareness       PT Treatment  Interventions DME instruction;Gait training;Therapeutic activities;Therapeutic exercise;Patient/family education;Balance training;Functional mobility training    PT Goals (Current goals can be found in the Care Plan section)  Acute Rehab PT Goals Patient Stated Goal: home PT Goal Formulation: With patient Time For Goal Achievement: 08/20/17 Potential to Achieve Goals: Good    Frequency Min 3X/week   Barriers to discharge        Co-evaluation               AM-PAC PT "6 Clicks" Daily Activity  Outcome Measure Difficulty turning over in bed (including adjusting  bedclothes, sheets and blankets)?: None Difficulty moving from lying on back to sitting on the side of the bed? : None Difficulty sitting down on and standing up from a chair with arms (e.g., wheelchair, bedside commode, etc,.)?: A Little Help needed moving to and from a bed to chair (including a wheelchair)?: A Little Help needed walking in hospital room?: A Little Help needed climbing 3-5 steps with a railing? : A Little 6 Click Score: 20    End of Session Equipment Utilized During Treatment: Gait belt Activity Tolerance: Patient tolerated treatment well Patient left: in chair;with call bell/phone within reach   PT Visit Diagnosis: Muscle weakness (generalized) (M62.81);Difficulty in walking, not elsewhere classified (R26.2);Unsteadiness on feet (R26.81)    Time: 9450-3888 PT Time Calculation (min) (ACUTE ONLY): 11 min   Charges:   PT Evaluation $PT Eval Moderate Complexity: 1 Mod     PT G Codes:          Weston Anna, MPT Pager: [phone]

## 2017-08-06 NOTE — Progress Notes (Signed)
Patient Demographics:    Allen Holloway, is a 84 y.o. male, DOB - [birth year], FMB:[id number]  Admit date - 08/03/2017   Admitting Physician Ejiroghene Arlyce Dice, MD  Outpatient Primary MD for the patient is McGowen, Adrian Blackwater, MD  LOS - 2   Chief Complaint  Patient presents with  . Dizziness  . Weakness        Subjective:    Allen Holloway today has no fevers, no emesis,  No chest pain, no dysuria, no fevers, no chills, no chest pains, no further diarrhea,   Assessment  & Plan :    Active Problems:   Hypothyroidism   Essential hypertension   Coronary atherosclerosis   COPD (chronic obstructive pulmonary disease) (HCC)   Chronic renal insufficiency, stage 3 (moderate) (HCC)   Chronic diastolic CHF (congestive heart failure) (HCC)   UTI (urinary tract infection)   Atrial fibrillation, new onset [facility])  Brief summary  84 y.o. male with medical history significant for hypothyroidism, CKD3, HTN, Diastolic CHF, COPD admitted on 08/03/2017 with concerns about fevers, diarrhea, generalized weakness and dizziness and presumed UTI.. Admission blood AND Urine cultures from 08/03/2017 with Enterobacteriaceae species.  Blood cultures with Salmonella and urine culture with pansensitive E. coli , d/w Dr Allen Salmons (ID), currently on Rocephin.  Awaiting ID recommendations for antibiotic choice.  Echocardiogram ordered due to Salmonella bacteremia  Plan:- 1) Salmonella Bacteremia-  Admission blood AND Urine cultures from 08/03/2017 with Enterobacteriaceae species.   Blood cultures with Salmonella (Presumed Gi Source) and urine culture with pansensitive E. coli , d/w Dr Allen Salmons (ID), currently on Rocephin.  Awaiting ID recommendations for antibiotic choice.  Echocardiogram ordered due to Salmonella bacteremia.  Sepsis symptomatology including fevers, leukocytosis, tachypnea, and orthostatic hemodynamic issues have resolved.    2)Diarrhea-improved, stool for C. difficile is negative, suspect Salmonella bacteremia is from GI source  3)Afib --- apparently new onset A. Fib, CHADSVASc at least 5, TSH and magnesium are within normal limits, borderline elevation of troponin noted,  Echocardiogram pending, hold off on Full anticoagulation given dropping hemoglobin, continue aspirin  4)Acute Anemia--stool occult blood is positive, however given bacteremia this may not be the best time for endoluminal evaluation, give PPI baseline hemoglobin 12 to 13, hemoglobin is down to 10,  , MCV and MCH are normal, however her RDW is elevated, ???? Some component of  Anemia of CKD, baseline creatinine around 3  5)CKD IV-review of records reveals baseline creatinine around 3, creatinine currently 2.4, avoid nephrotoxic agents  6)Hypothyroidism-TSH is normal, continue levothyroxine 100 mcg daily  7)HFpEF-last known EF 55-60%, history of chronic diastolic dysfunction CHF, no acute exacerbation at this time, clinically does not appear volume overloaded, Lasix on hold, await repeat echo  8)E coli UTI-continue IV Rocephin pending ID consult as noted above #1  9)Enterobacteriaceae/Salmonella sepsis-  See #1 above above, patient met sepsis criteria on admission with fever, leukocytosis, tachypnea findings and hemodynamic concerns with orthostatic findings of tachycardia with postural  Change, hemodynamics has improved with IV fluids and IV antibiotics treat Salmonella  sepsis as above in #1 . Admission blood AND Urine cultures from 08/03/2017 with Enterobacteriaceae species.  Blood cultures with Salmonella and urine culture with pansensitive E. coli , d/w Dr Allen Salmons (ID), currently on Rocephin.  Awaiting ID recommendations for antibiotic choice.  Echocardiogram ordered due to Salmonella bacteremia  10)Generalized Weakness-PT eval appreciated patient will need home health services post discharge    Code Status : Full code  Disposition Plan  :  home with [facility]  DVT Prophylaxis  :  Lovenox    Lab Results  Component Value Date   PLT 139 (L) 08/06/2017    Inpatient Medications  Scheduled Meds: . aspirin  162.5 mg Oral Daily  . atorvastatin  10 mg Oral Daily  . enoxaparin (LOVENOX) injection  30 mg Subcutaneous Q24H  . levothyroxine  100 mcg Oral QAC breakfast  . pantoprazole  40 mg Oral Daily   Continuous Infusions: . cefTRIAXone (ROCEPHIN)  IV Stopped (08/05/17 1645)   PRN Meds:.acetaminophen, ondansetron **OR** ondansetron (ZOFRAN) IV    Anti-infectives (From admission, onward)   Start     Dose/Rate Route Frequency Ordered Stop   08/04/17 2100  cefTRIAXone (ROCEPHIN) 1 g in sodium chloride 0.9 % 100 mL IVPB  Status:  Discontinued     1 g 200 mL/hr over 30 Minutes Intravenous Every 24 hours 08/04/17 0006 08/04/17 1258   08/04/17 1600  cefTRIAXone (ROCEPHIN) 2 g in sodium chloride 0.9 % 100 mL IVPB     2 g 200 mL/hr over 30 Minutes Intravenous Every 24 hours 08/04/17 1258     08/03/17 2145  cefTRIAXone (ROCEPHIN) 1 g in sodium chloride 0.9 % 100 mL IVPB     1 g 200 mL/hr over 30 Minutes Intravenous  Once 08/03/17 2133 08/03/17 2255        Objective:   Vitals:   08/05/17 0928 08/05/17 1320 08/05/17 2146 08/06/17 0457  BP: 124/65 131/70 135/71 139/72  Pulse: 89 80 77 73  Resp:  _0 Temp:  97.8 F (36.6 C) 98.6 F (37 C) 98.9 F (37.2 C)  TempSrc:  Oral Oral Oral  SpO2: 98% 98% 99% 98%  Weight:      Height:        Wt Readings from Last 3 Encounters:  08/04/17 91.2 kg (201 lb 1 oz)  07/25/17 93.2 kg (205 lb 6 oz)  05/01/17 91.4 kg (201 lb 8 oz)     Intake/Output Summary (Last 24 hours) at 08/06/2017 1216 Last data filed at 08/06/2017 1150 Gross per 24 hour  Intake 840 ml  Output -  Net 840 ml     Physical Exam  Gen:- Awake Alert,  In no apparent distress  HEENT:- [place].AT, No sclera icterus Neck-Supple Neck,No JVD,.  Lungs-  CTAB , good air movement CV- S1, S2 normal Abd-  +ve B.Sounds,  Abd Soft, No tenderness, no CVA tenderness Extremity/Skin:- 1 +  edema,   good pulses Psych-affect is appropriate, oriented x3 Neuro-no new focal deficits, no tremors   Data Review:   Micro Results Recent Results (from the past 240 hour(s))  Urine culture     Status: Abnormal   Collection Time: 08/03/17  8:21 PM  Result Value Ref Range Status   Specimen Description   Final    URINE, RANDOM Performed at [facility] [address] [place], [place] [address]    Special Requests   Final    NONE Performed at [facility], [place] [address] [place], [place] [address]    Culture >=100,000 COLONIES/mL ESCHERICHIA COLI (A)  Final   Report Status 08/06/2017 FINAL  Final   Organism ID, Bacteria ESCHERICHIA COLI (A)  Final  Susceptibility   Escherichia coli - MIC*    AMPICILLIN 4 SENSITIVE Sensitive     CEFAZOLIN <=4 SENSITIVE Sensitive     CEFTRIAXONE <=1 SENSITIVE Sensitive     CIPROFLOXACIN <=0.25 SENSITIVE Sensitive     GENTAMICIN <=1 SENSITIVE Sensitive     IMIPENEM <=0.25 SENSITIVE Sensitive     NITROFURANTOIN <=16 SENSITIVE Sensitive     TRIMETH/SULFA <=20 SENSITIVE Sensitive     AMPICILLIN/SULBACTAM <=2 SENSITIVE Sensitive     PIP/TAZO <=4 SENSITIVE Sensitive     Extended ESBL NEGATIVE Sensitive     * >=100,000 COLONIES/mL ESCHERICHIA COLI  Blood Culture (routine x 2)     Status: Abnormal   Collection Time: 08/03/17  8:24 PM  Result Value Ref Range Status   Specimen Description   Final    BLOOD LEFT ANTECUBITAL Performed at [facility] [address], [place], [place] [address]    Special Requests   Final    BOTTLES DRAWN AEROBIC AND ANAEROBIC Blood Culture adequate volume Performed at [facility] [address], [place], [place] [address]    Culture  Setup Time   Final    GRAM NEGATIVE RODS IN BOTH AEROBIC AND ANAEROBIC BOTTLES CRITICAL RESULT CALLED TO, READ BACK BY AND VERIFIED WITH:  TPICKERING,PHARMD _0  08/04/17 BY LHOWARD Performed at [facility], [address], [place], [place] [address]    Culture SALMONELLA SPECIES (A)  Final   Report Status 08/06/2017 FINAL  Final   Organism ID, Bacteria SALMONELLA SPECIES  Final      Susceptibility   Salmonella species - MIC*    AMPICILLIN <=2 SENSITIVE Sensitive     LEVOFLOXACIN <=0.12 SENSITIVE Sensitive     TRIMETH/SULFA <=20 SENSITIVE Sensitive     * SALMONELLA SPECIES  Blood Culture (routine x 2)     Status: Abnormal   Collection Time: 08/03/17  8:24 PM  Result Value Ref Range Status   Specimen Description   Final    BLOOD RIGHT ANTECUBITAL Performed at [facility] [address] [place], [place] [address]    Special Requests   Final    BOTTLES DRAWN AEROBIC AND ANAEROBIC Blood Culture adequate volume Performed at [facility] [address], [place], [place] [address]    Culture  Setup Time   Final    GRAM NEGATIVE RODS IN BOTH AEROBIC AND ANAEROBIC BOTTLES CRITICAL VALUE NOTED.  VALUE IS CONSISTENT WITH PREVIOUSLY REPORTED AND CALLED VALUE.    Culture (A)  Final    SALMONELLA SPECIES SUSCEPTIBILITIES PERFORMED ON PREVIOUS CULTURE WITHIN THE LAST 5 DAYS. Performed at [facility], [place] [address] [place], [place] [address]    Report Status 08/06/2017 FINAL  Final  Blood Culture ID Panel (Reflexed)     Status: Abnormal   Collection Time: 08/03/17  8:24 PM  Result Value Ref Range Status   Enterococcus species NOT DETECTED NOT DETECTED Final   Listeria monocytogenes NOT DETECTED NOT DETECTED Final   Staphylococcus species NOT DETECTED NOT DETECTED Final   Staphylococcus aureus NOT DETECTED NOT DETECTED Final   Streptococcus species NOT DETECTED NOT DETECTED Final   Streptococcus agalactiae NOT DETECTED NOT DETECTED Final   Streptococcus pneumoniae NOT DETECTED NOT DETECTED Final   Streptococcus pyogenes NOT DETECTED NOT DETECTED Final   Acinetobacter  baumannii NOT DETECTED NOT DETECTED Final   Enterobacteriaceae species DETECTED (A) NOT DETECTED Final    Comment: Enterobacteriaceae represent a large family of gram negative  bacteria, not a single organism. Refer to culture for further identification. CRITICAL RESULT CALLED TO, READ BACK BY AND VERIFIED WITH: TPICKERING,PHARMD _0  08/04/17 BY LHOWARD    Enterobacter cloacae complex NOT DETECTED NOT DETECTED Final   Escherichia coli NOT DETECTED NOT DETECTED Final   Klebsiella oxytoca NOT DETECTED NOT DETECTED Final   Klebsiella pneumoniae NOT DETECTED NOT DETECTED Final   Proteus species NOT DETECTED NOT DETECTED Final   Serratia marcescens NOT DETECTED NOT DETECTED Final   Carbapenem resistance NOT DETECTED NOT DETECTED Final   Haemophilus influenzae NOT DETECTED NOT DETECTED Final   Neisseria meningitidis NOT DETECTED NOT DETECTED Final   Pseudomonas aeruginosa NOT DETECTED NOT DETECTED Final   Candida albicans NOT DETECTED NOT DETECTED Final   Candida glabrata NOT DETECTED NOT DETECTED Final   Candida krusei NOT DETECTED NOT DETECTED Final   Candida parapsilosis NOT DETECTED NOT DETECTED Final   Candida tropicalis NOT DETECTED NOT DETECTED Final    Comment: Performed at [facility], [place] [address] [place], [place] [address]  C difficile quick scan w PCR reflex     Status: None   Collection Time: 08/04/17  1:59 AM  Result Value Ref Range Status   C Diff antigen NEGATIVE NEGATIVE Final   C Diff toxin NEGATIVE NEGATIVE Final   C Diff interpretation No C. difficile detected.  Final    Comment: Performed at [facility], [place] [address] [place], [place] [address]    Radiology Reports Dg Chest 2 View  Result Date: 08/03/2017 CLINICAL DATA:  Weakness.  Lightheadedness. EXAM: CHEST - 2 VIEW COMPARISON:  Radiograph 11/01/2013 FINDINGS: Post median sternotomy. Mild cardiomegaly with aortic atherosclerosis. No pulmonary edema. Chronic elevation of right  hemidiaphragm. No focal airspace disease, pleural effusion or pneumothorax. No acute osseous abnormalities. IMPRESSION: Mild cardiomegaly and aortic atherosclerosis.  No acute findings. Electronically Signed   By: Jeb Levering M.D.   On: 08/03/2017 20:59     CBC Recent Labs  Lab 08/03/17 2024 08/04/17 0228 08/06/17 0447  WBC 9.3 7.1 6.7  HGB 10.5* 10.0* 10.1*  HCT 34.0* 32.1* 32.6*  PLT 145* 139* 139*  MCV 89.5 89.7 88.8  MCH 27.6 27.9 27.5  MCHC 30.9 31.2 31.0  RDW 16.0* 16.1* 16.3*  LYMPHSABS 1.3  --   --   MONOABS 0.5  --   --   EOSABS 0.0  --   --   BASOSABS 0.0  --   --     Chemistries  Recent Labs  Lab 08/03/17 2024 08/04/17 0225 08/06/17 0447  NA 136  --  139  K 3.8  --  4.0  CL 106  --  110  CO2 20*  --  20*  GLUCOSE 130*  --  125*  BUN 40*  --  31*  CREATININE 2.77*  --  2.45*  CALCIUM 8.7*  --  8.7*  MG  --  2.1  --   AST 28  --   --   ALT 17  --   --   ALKPHOS 82  --   --   BILITOT 1.1  --   --    ------------------------------------------------------------------------------------------------------------------ No results for input(s): CHOL, HDL, LDLCALC, TRIG, CHOLHDL, LDLDIRECT in the last 72 hours.  Lab Results  Component Value Date   HGBA1C 7.0 (H) 07/25/2017   ------------------------------------------------------------------------------------------------------------------ Recent Labs    08/04/17 0237  TSH 0.886   ------------------------------------------------------------------------------------------------------------------ No results for input(s): VITAMINB12, FOLATE, FERRITIN, TIBC, IRON, RETICCTPCT in the  last 72 hours.  Coagulation profile Recent Labs  Lab 08/03/17 2024  INR 1.18    No results for input(s): DDIMER in the last 72 hours.  Cardiac Enzymes Recent Labs  Lab 08/04/17 0228 08/04/17 0806  TROPONINI 0.16* 0.19*    ------------------------------------------------------------------------------------------------------------------ No results found for: BNP   Roxan Hockey M.D on 08/06/2017 at 12:16 PM  Between 7am to 7pm - Pager - [phone]  After 7pm go to [email or web address] - password [email or web address]  [facility] -  Office  [phone]   Voice Recognition Viviann Spare dictation system was used to create this note, attempts have been made to correct errors. Please contact the author with questions and/or clarifications.

## 2017-08-06 NOTE — Consult Note (Signed)
[facility] for Infectious Disease       Reason for Consult: Salmonella bacteremia    Referring Physician: Dr. Denton Brick  Principal Problem:   Salmonella bacteremia/Sepsis Active Problems:   Hypothyroidism   Essential hypertension   Coronary atherosclerosis   COPD (chronic obstructive pulmonary disease) (HCC)   Chronic renal insufficiency, stage 3 (moderate) (HCC)   Chronic diastolic CHF (congestive heart failure) (HCC)   UTI (urinary tract infection)   Atrial fibrillation, new onset ([place])   . aspirin  162.5 mg Oral Daily  . atorvastatin  10 mg Oral Daily  . enoxaparin (LOVENOX) injection  30 mg Subcutaneous Q24H  . levothyroxine  100 mcg Oral QAC breakfast  . pantoprazole  40 mg Oral Daily    Recommendations: Continue ceftriaxone inpatient Use high dose cipro, renally dosed, at discharge for 7 days total of antibiotics   Assessment: He has gastronenteritis with positive Salmonella in blood cultures and pansensitive.  Echo without concerns.  As long as repeat blood cultures remain negative, no further evaluation indicated.  He does not need to stay inpatient for this from an ID standpoint.    Antibiotics: Ceftriaxone day 4  HPI: Allen Holloway is a 84 y.o. male with hypothyroidism, chronic kidney disease, HTN, COPD with some dysuria and loose stools and now with Salmonella in the blood stream 2/2.  Feels better since admission.  Has diverticulosis.  Started on ceftriaxone for presumed UTI and urine did grow E coli.  No associated rash. Has not eaten cut melons from supermarkets.  Has eaten in restaurants.  No known contacts.       Review of Systems:  Constitutional: negative for fevers, chills and anorexia Genitourinary: negative for frequency, dysuria and symptoms resolved Musculoskeletal: negative for myalgias and arthralgias All other systems reviewed and are negative    Past Medical History:  Diagnosis Date  . CAD (coronary artery disease)   . Carotid  bruit    60-79% bilat In carot sten, stable over serial exams.  04/2015 stable 27-03% RICA, 50-09% LICA.  Repeat 1 yr.  . Cholelithiasis   . Chronic diastolic heart failure ([place])    CHF clinic  . Chronic renal insufficiency, stage III (moderate) (HCC)    Stage III/IV--GFR 25-30 ml/min.  . 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  Avoid BB/verap/dilt  . COPD (chronic obstructive pulmonary disease) ([place])   . Diabetes mellitus without complication ([place])    06/8180 HbA1c 6.8%  . Diverticulosis   . GERD (gastroesophageal reflux disease)   . Hyperlipidemia   . Hypertension   . Hypothyroidism   . Lumbago   . Osteoarthritis   . Right hip pain    Trochanteric bursitis: Dr. Alvan Dame injected this 09/2015.  Hip x-rays normal at Dr. Aurea Graff office.  . Venous insufficiency of both lower extremities     Social History   Tobacco Use  . Smoking status: Former Research scientist (life sciences)  . Smokeless tobacco: Never Used  . Tobacco comment: quit 20-11yrs ago.  Substance Use Topics  . Alcohol use: No  . Drug use: No    Family History  Problem Relation Age of Onset  . Hyperlipidemia Unknown   . Hypertension Unknown   . Colon cancer Neg Hx     Allergies  Allergen Reactions  . Amlodipine     edema  . Benazepril Hcl  REACTION: elev K    Physical Exam: Constitutional: in no apparent distress and alert  Vitals:   08/06/17 0457 08/06/17 1411  BP: 139/72 133/72  Pulse: 73 68  Resp: 18 18  Temp: 98.9 F (37.2 C) 97.7 F (36.5 C)  SpO2: 98% (!) 83%   EYES: anicteric ENMT: nothrush Cardiovascular: Cor RRR Respiratory: CTA B; normal respiratory effort GI: Bowel sounds are normal, liver is not enlarged, spleen is not enlarged Musculoskeletal: no pedal edema noted Skin: negatives: no rash Hematologic: no cervical lad  Lab Results  Component Value Date   WBC 6.7  08/06/2017   HGB 10.1 (L) 08/06/2017   HCT 32.6 (L) 08/06/2017   MCV 88.8 08/06/2017   PLT 139 (L) 08/06/2017    Lab Results  Component Value Date   CREATININE 2.45 (H) 08/06/2017   BUN 31 (H) 08/06/2017   NA 139 08/06/2017   K 4.0 08/06/2017   CL 110 08/06/2017   CO2 20 (L) 08/06/2017    Lab Results  Component Value Date   ALT 17 08/03/2017   AST 28 08/03/2017   ALKPHOS 82 08/03/2017     Microbiology: Recent Results (from the past 240 hour(s))  Urine culture     Status: Abnormal   Collection Time: 08/03/17  8:21 PM  Result Value Ref Range Status   Specimen Description   Final    URINE, RANDOM Performed at [facility], [place] [address] [place], [place] [address]    Special Requests   Final    NONE Performed at [facility], [place] [address] [place], [place] [address]    Culture >=100,000 COLONIES/mL ESCHERICHIA COLI (A)  Final   Report Status 08/06/2017 FINAL  Final   Organism ID, Bacteria ESCHERICHIA COLI (A)  Final      Susceptibility   Escherichia coli - MIC*    AMPICILLIN 4 SENSITIVE Sensitive     CEFAZOLIN <=4 SENSITIVE Sensitive     CEFTRIAXONE <=1 SENSITIVE Sensitive     CIPROFLOXACIN <=0.25 SENSITIVE Sensitive     GENTAMICIN <=1 SENSITIVE Sensitive     IMIPENEM <=0.25 SENSITIVE Sensitive     NITROFURANTOIN <=16 SENSITIVE Sensitive     TRIMETH/SULFA <=20 SENSITIVE Sensitive     AMPICILLIN/SULBACTAM <=2 SENSITIVE Sensitive     PIP/TAZO <=4 SENSITIVE Sensitive     Extended ESBL NEGATIVE Sensitive     * >=100,000 COLONIES/mL ESCHERICHIA COLI  Blood Culture (routine x 2)     Status: Abnormal   Collection Time: 08/03/17  8:24 PM  Result Value Ref Range Status   Specimen Description   Final    BLOOD LEFT ANTECUBITAL Performed at [facility] [address], [place], [place] [address]    Special Requests   Final    BOTTLES DRAWN AEROBIC AND ANAEROBIC Blood Culture adequate volume Performed at [facility] [address], [place], [place] [address]    Culture  Setup Time   Final    GRAM NEGATIVE RODS IN BOTH AEROBIC AND ANAEROBIC BOTTLES CRITICAL RESULT CALLED TO, READ BACK BY AND VERIFIED WITH: TPICKERING,PHARMD @1249  08/04/17 BY LHOWARD Performed at [facility], [place] [address], [place], [place] [address]    Culture SALMONELLA SPECIES (A)  Final   Report Status 08/06/2017 FINAL  Final   Organism ID, Bacteria SALMONELLA SPECIES  Final      Susceptibility   Salmonella species - MIC*    AMPICILLIN <=2 SENSITIVE Sensitive  LEVOFLOXACIN <=0.12 SENSITIVE Sensitive     TRIMETH/SULFA <=20 SENSITIVE Sensitive     * SALMONELLA SPECIES  Blood Culture (routine x 2)     Status: Abnormal   Collection Time: 08/03/17  8:24 PM  Result Value Ref Range Status   Specimen Description   Final    BLOOD RIGHT ANTECUBITAL Performed at [facility] [address], [place], [place] [address]    Special Requests   Final    BOTTLES DRAWN AEROBIC AND ANAEROBIC Blood Culture adequate volume Performed at [facility] [address] [place], [place] [address]    Culture  Setup Time   Final    GRAM NEGATIVE RODS IN BOTH AEROBIC AND ANAEROBIC BOTTLES CRITICAL VALUE NOTED.  VALUE IS CONSISTENT WITH PREVIOUSLY REPORTED AND CALLED VALUE.    Culture (A)  Final    SALMONELLA SPECIES SUSCEPTIBILITIES PERFORMED ON PREVIOUS CULTURE WITHIN THE LAST 5 DAYS. Performed at [facility], [place] [address], [place], [place] [address]    Report Status 08/06/2017 FINAL  Final  Blood Culture ID Panel (Reflexed)     Status: Abnormal   Collection Time: 08/03/17  8:24 PM  Result Value Ref Range Status   Enterococcus species NOT DETECTED NOT DETECTED Final   Listeria monocytogenes NOT DETECTED NOT DETECTED Final   Staphylococcus species NOT DETECTED NOT DETECTED Final   Staphylococcus aureus NOT DETECTED NOT DETECTED Final   Streptococcus species  NOT DETECTED NOT DETECTED Final   Streptococcus agalactiae NOT DETECTED NOT DETECTED Final   Streptococcus pneumoniae NOT DETECTED NOT DETECTED Final   Streptococcus pyogenes NOT DETECTED NOT DETECTED Final   Acinetobacter baumannii NOT DETECTED NOT DETECTED Final   Enterobacteriaceae species DETECTED (A) NOT DETECTED Final    Comment: Enterobacteriaceae represent a large family of gram negative bacteria, not a single organism. Refer to culture for further identification. CRITICAL RESULT CALLED TO, READ BACK BY AND VERIFIED WITH: TPICKERING,PHARMD @1249  08/04/17 BY LHOWARD    Enterobacter cloacae complex NOT DETECTED NOT DETECTED Final   Escherichia coli NOT DETECTED NOT DETECTED Final   Klebsiella oxytoca NOT DETECTED NOT DETECTED Final   Klebsiella pneumoniae NOT DETECTED NOT DETECTED Final   Proteus species NOT DETECTED NOT DETECTED Final   Serratia marcescens NOT DETECTED NOT DETECTED Final   Carbapenem resistance NOT DETECTED NOT DETECTED Final   Haemophilus influenzae NOT DETECTED NOT DETECTED Final   Neisseria meningitidis NOT DETECTED NOT DETECTED Final   Pseudomonas aeruginosa NOT DETECTED NOT DETECTED Final   Candida albicans NOT DETECTED NOT DETECTED Final   Candida glabrata NOT DETECTED NOT DETECTED Final   Candida krusei NOT DETECTED NOT DETECTED Final   Candida parapsilosis NOT DETECTED NOT DETECTED Final   Candida tropicalis NOT DETECTED NOT DETECTED Final    Comment: Performed at [facility], [place] [address], [place], [place] [address]  C difficile quick scan w PCR reflex     Status: None   Collection Time: 08/04/17  1:59 AM  Result Value Ref Range Status   C Diff antigen NEGATIVE NEGATIVE Final   C Diff toxin NEGATIVE NEGATIVE Final   C Diff interpretation No C. difficile detected.  Final    Comment: Performed at [facility], [place] [address] [place], [place] [address]    Robert W Comer, MD [facility], [place] for Infectious Disease [facility] [email or web address] [phone] pager  [phone] cell 08/06/2017, 2:57 PM

## 2017-08-06 NOTE — Progress Notes (Signed)
  Echocardiogram 2D Echocardiogram has been performed.  Allen Holloway M 08/06/2017, 1:44 PM

## 2017-08-07 LAB — BASIC METABOLIC PANEL
Anion gap: 10 (ref 5–15)
BUN: 31 mg/dL — ABNORMAL HIGH (ref 6–20)
CHLORIDE: 108 mmol/L (ref 101–111)
CO2: 22 mmol/L (ref 22–32)
CREATININE: 2.17 mg/dL — AB (ref 0.61–1.24)
Calcium: 8.8 mg/dL — ABNORMAL LOW (ref 8.9–10.3)
GFR calc non Af Amer: 25 mL/min — ABNORMAL LOW (ref >60)
GFR, EST AFRICAN AMERICAN: 29 mL/min — AB (ref >60)
Glucose, Bld: 117 mg/dL — ABNORMAL HIGH (ref 65–99)
POTASSIUM: 4.1 mmol/L (ref 3.5–5.1)
SODIUM: 140 mmol/L (ref 135–145)

## 2017-08-07 LAB — CBC
HEMATOCRIT: 32.5 % — AB (ref 39.0–52.0)
HEMOGLOBIN: 10 g/dL — AB (ref 13.0–17.0)
MCH: 27.9 pg (ref 26.0–34.0)
MCHC: 30.8 g/dL (ref 30.0–36.0)
MCV: 90.5 fL (ref 78.0–100.0)
Platelets: 167 10*3/uL (ref 150–400)
RBC: 3.59 MIL/uL — AB (ref 4.22–5.81)
RDW: 16.5 % — ABNORMAL HIGH (ref 11.5–15.5)
WBC: 6.2 10*3/uL (ref 4.0–10.5)

## 2017-08-07 MED ORDER — PANTOPRAZOLE SODIUM 40 MG PO TBEC: 40.0000 mg | DELAYED_RELEASE_TABLET | Freq: Every day | ORAL | 0 refills | Status: DC

## 2017-08-07 MED ORDER — CIPROFLOXACIN HCL 500 MG PO TABS
500.0000 mg | ORAL_TABLET | Freq: Once | ORAL | Status: AC
  Administered 2017-08-07: 500 mg via ORAL
  Filled 2017-08-07: qty 1

## 2017-08-07 MED ORDER — LACTINEX PO CHEW: 1.0000 | CHEWABLE_TABLET | Freq: Three times a day (TID) | ORAL | 0 refills | Status: DC

## 2017-08-07 MED ORDER — CIPROFLOXACIN HCL 500 MG PO TABS: 500.0000 mg | ORAL_TABLET | Freq: Every day | ORAL | 0 refills | Status: DC

## 2017-08-07 NOTE — Progress Notes (Signed)
Spoke with pt and wife at bedside concerning Allen Holloway. Both selected [facility]. Referral given to in house rep.

## 2017-08-07 NOTE — Care Management Important Message (Signed)
Important Message  Patient Details  Name: BRISON FIUMARA MRN: [id number] Date of Birth: [birth year]   Medicare Important Message Given:  Yes    Kerin Salen 08/07/2017, 10:33 AMImportant Message  Patient Details  Name: CONROY GORACKE MRN: [id number] Date of Birth: 1926/06/15   Medicare Important Message Given:  Yes    Kerin Salen 08/07/2017, 10:33 AM

## 2017-08-07 NOTE — Progress Notes (Signed)
Pt discharged from the unit via wheelchair. Discharge instructions were reviewed with the pt and family members. No questions or concerns from pt or family members at this time.

## 2017-08-07 NOTE — Discharge Summary (Signed)
Allen Holloway, is a 84 y.o. male  DOB December 10, 1926  MRN [id number].  Admission date:  08/03/2017  Admitting Physician  Bethena Roys, MD  Discharge Date:  08/07/2017   Primary MD  Tammi Sou, MD  Recommendations for primary care physician for things to follow:   1)Take Medications as Prescribed 2)Follow-up with your primary care doctor in 1 week for reevaluation and repeat kidney test (BMP) 3)Follow-up with PCP for repeat CBC, consider further anemia workup if repeat CBC/complete blood count shows low hemoglobin and/or stool occult blood remains positive after resolution of Gastrointestinal symptoms.  Admission Diagnosis  Dizziness; Weakness  Discharge Diagnosis  Dizziness; Weakness    Principal Problem:   Salmonella bacteremia/Sepsis Active Problems:   Hypothyroidism   Essential hypertension   Coronary atherosclerosis   COPD (chronic obstructive pulmonary disease) (HCC)   Chronic renal insufficiency, stage 3 (moderate) (HCC)   Chronic diastolic CHF (congestive heart failure) (HCC)   UTI (urinary tract infection)   Atrial fibrillation, new onset ([place])     Past Medical History:  Diagnosis Date  . CAD (coronary artery disease)   . Carotid bruit    60-79% bilat In carot sten, stable over serial exams.  04/2015 stable 86-75% RICA, 19-82% LICA.  Repeat 1 yr.  . Cholelithiasis   . Chronic diastolic heart failure ([place])    CHF clinic  . Chronic renal insufficiency, stage III (moderate) (HCC)    Stage III/IV--GFR 25-30 ml/min.  . 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  Avoid BB/verap/dilt  . COPD (chronic obstructive pulmonary disease) ([place])   . Diabetes mellitus without complication ([place])    07/2996 HbA1c 6.8%  . Diverticulosis   . GERD  (gastroesophageal reflux disease)   . Hyperlipidemia   . Hypertension   . Hypothyroidism   . Lumbago   . Osteoarthritis   . Right hip pain    Trochanteric bursitis: Dr. Alvan Dame injected this 09/2015.  Hip x-rays normal at Dr. Aurea Graff office.  . Venous insufficiency of both lower extremities     Past Surgical History:  Procedure Laterality Date  . APPENDECTOMY    . CORONARY ARTERY BYPASS GRAFT  2005   LIMA-LAD,SVG-PDA,SVG-D1,SVG-OM.  Myoview (9/08) showed EF63%,no scaror ischemia.  . TRANSTHORACIC ECHOCARDIOGRAM  01/29/2015; 03/08/17   2016 EF 55-60%, normal LV function and wall motion.  Mod tricusp regurg.  2018 EF 55-60%, normal wall motion, mod tricusp regurg.  Pulmonary arteries: Systolic pressure was moderately increased (secondary to DD).     HPI  from the history and physical done on the day of admission:     Patient coming from: Home   Chief Complaint: Weakness, burning with urination  HPI: Allen Holloway is a 84 y.o. male with medical history significant for hypothyroidism, CKD3, HTN, Diastolic CHF, COPD, who was brought to the ED via EMS reports of lightheadedness and inability to get  up from the commode Holloway.  Patient reports dysuria for the past 2 days.  Patient also reports 3 days of loose stools 3-4 times daily.  Nonbloody.  No abdominal pain.  No vomiting.  Also endorses poor p.o. intake over the past few days.  She has been compliant with this 40 mg daily dosing of Lasix. Patient denies shortness of breath, has a chronic unchanged cough secondary to COPD, but has been sneezing Holloway.  Denies fevers or chills.  ED Course: Temperature 100.9 in ED. Mild intake and intermittent tachypnea to 27.  O2 sats greater than 92% on room air.  WBC normal 9.3.  Creatinine about baseline 2.77.  Elevated I. Stat trop- 0.08.  Lactic acid 1.08.  EKG - Atria fib.  Chest x-ray negative for acute abnormality.  UA-negative nitrites leukocytes and many bacteria. Patient was given 500 no bolus in  the ED, 1g ceftriaxone. EDP was concern for possible early sepsis hence hospitalist was called to admit for UTI generalized weakness.    Hospital Course:   Brief Summary 84 y.o.malewith medical history significantforhypothyroidism, CKD3, HTN, Diastolic CHF, COPD admitted on 08/03/2017 with concerns about fevers, diarrhea, generalized weakness and dizziness and presumed UTI.. Admission blood AND Urine cultures from 08/03/2017 with Enterobacteriaceae species.  Blood cultures with Salmonella and urine culture with pansensitive E. coli , d/w Dr Linus Salmons (ID), currently on Rocephin.  ID recommendations appreciated,  Echocardiogram ordered due to Salmonella bacteremia does not demonstrate any valvular concerns at this time  Plan:- 1)Salmonella Bacteremia/Sepsis-  Admission blood cultures from 08/03/2017 with Salmonella species, repeat cultures from 415 2019- so far.  Urine cultures from 08/03/2017 with pansensitive E. Coli.    d/w Dr Linus Salmons (ID), currently on Rocephin.  ID recommendations for antibiotic choice appreciated.  Echocardiogram ordered due to Salmonella bacteremia without acute concerns or infective endocarditis findings.  Sepsis symptomatology including fevers, leukocytosis, tachypnea, and orthostatic hemodynamic issues have resolved.   Patient was initially treated with IV Rocephin, switched to Cipro, we will discharge on 7 days of Cipro adjusted for renal function.  patient met sepsis criteria on admission with fever, leukocytosis, tachypnea findings and hemodynamic concerns with orthostatic findings of tachycardia with postural  Change, hemodynamics has Normalized    2)Diarrhea-resolved, stool for C. difficile is negative, suspect Salmonella bacteremia is from GI source  3)Afib --- apparently new onset A. Fib, CHADSVASc at least 5, TSH and magnesium are within normal limits, borderline elevation of troponin noted,  Echocardiogram without significant wall motion abnormalities, EF 55-60%, risk  Versus benefit of anticoagulation discussed with patient and daughter-in-law, at this time they declined Full anticoagulation given dropping hemoglobin, treat with aspirin 162 mg daily  4)Acute Anemia--stool occult blood is positive, however given bacteremia this may not be the best time for endoluminal evaluation, give PPI .  Heme positive stool may be related to diarrhea in the setting of Salmonella infection .  baseline hemoglobin 12 to 13, hemoglobin is down to 10,  , MCV and MCH are normal, however her RDW is elevated, ???? Some component of  Anemia of CKD, baseline creatinine around 3.  Follow-up with PCP for repeat CBC, consider further anemia workup if repeat CBC is low and stool occult blood remains positive after resolution of GI symptoms.   5)CKD IV-review of records reveals baseline creatinine around 3, creatinine currently 2.4, avoid nephrotoxic agents  6)Hypothyroidism-TSH is normal, continue levothyroxine 100 mcg daily  7)HFpEF-last known EF 55-60%, history of chronic diastolic dysfunction CHF, no acute exacerbation at this  time, clinically does not appear volume overloaded, Lasix on hold, await repeat echo  8)E coli UTI- treated with IV Rocephin ,  ID consult as noted above #1, d/c on cipro  9)Generalized Weakness-PT eval appreciated patient will need home health services post discharge   Code Status : Full code  Disposition Plan  : home with [facility]   Discharge Condition: stable  Follow UP-- PCP in 1 week for Repeat CBC and Repeat BMP    Consults obtained - ID  Diet and Activity recommendation:  As advised  Discharge Instructions    Discharge Instructions    (HEART FAILURE PATIENTS) Call MD:  Anytime you have any of the following symptoms: 1) 3 pound weight gain in 24 hours or 5 pounds in 1 week 2) shortness of breath, with or without a dry hacking cough 3) swelling in the hands, feet or stomach 4) if you have to sleep on extra pillows at night in order to  breathe.   Complete by:  As directed    Call MD for:  difficulty breathing, headache or visual disturbances   Complete by:  As directed    Call MD for:  persistant dizziness or light-headedness   Complete by:  As directed    Call MD for:  persistant nausea and vomiting   Complete by:  As directed    Call MD for:  severe uncontrolled pain   Complete by:  As directed    Call MD for:  temperature >100.4   Complete by:  As directed    Diet - low sodium heart healthy   Complete by:  As directed    Discharge instructions   Complete by:  As directed    1)Take Medications as Prescribed 2)Follow-up with your primary care doctor in 1 week for reevaluation and repeat kidney test (BMP) 3)Follow-up with PCP for repeat CBC, consider further anemia workup if repeat CBC/complete blood count shows low hemoglobin and/or stool occult blood remains positive after resolution of Gastrointestinal symptoms.   Increase activity slowly   Complete by:  As directed       Discharge Medications     Allergies as of 08/07/2017      Reactions   Amlodipine    edema   Benazepril Hcl    REACTION: elev K      Medication List    TAKE these medications   aspirin 325 MG tablet Take 162.5 mg by mouth daily. TAKE 1/2 TAB   atorvastatin 20 MG tablet Commonly known as:  LIPITOR Take 10 mg by mouth daily.   cholecalciferol 1000 units tablet Commonly known as:  VITAMIN D Take 1,000 Units by mouth daily.   ciprofloxacin 500 MG tablet Commonly known as:  CIPRO Take 1 tablet (500 mg total) by mouth daily with breakfast.   Coenzyme Q10 200 MG Tabs Take 1 tablet by mouth daily.   furosemide 40 MG tablet Commonly known as:  LASIX Take 40 mg by mouth daily.   Garlic 9628 MG Caps Take 1 capsule by mouth daily.   Krill Oil 350 MG Caps Take 1 capsule by mouth daily.   lactobacillus acidophilus & bulgar chewable tablet Chew 1 tablet by mouth 3 (three) times daily with meals.   levothyroxine 100 MCG  tablet Commonly known as:  SYNTHROID, LEVOTHROID Take 100 mcg by mouth daily.   omeprazole 20 MG capsule Commonly known as:  PRILOSEC Take 20 mg by mouth daily.       Major procedures and Radiology Reports -  PLEASE review detailed and final reports for all details, in brief -   Dg Chest 2 View  Result Date: 08/03/2017 CLINICAL DATA:  Weakness.  Lightheadedness. EXAM: CHEST - 2 VIEW COMPARISON:  Radiograph 11/01/2013 FINDINGS: Post median sternotomy. Mild cardiomegaly with aortic atherosclerosis. No pulmonary edema. Chronic elevation of right hemidiaphragm. No focal airspace disease, pleural effusion or pneumothorax. No acute osseous abnormalities. IMPRESSION: Mild cardiomegaly and aortic atherosclerosis.  No acute findings. Electronically Signed   By: Jeb Levering M.D.   On: 08/03/2017 20:59    Micro Results   Recent Results (from the past 240 hour(s))  Urine culture     Status: Abnormal   Collection Time: 08/03/17  8:21 PM  Result Value Ref Range Status   Specimen Description   Final    URINE, RANDOM Performed at [facility] [address] [place], [place] [address]    Special Requests   Final    NONE Performed at [facility], [place] [address], [place], [place] [address]    Culture >=100,000 COLONIES/mL ESCHERICHIA COLI (A)  Final   Report Status 08/06/2017 FINAL  Final   Organism ID, Bacteria ESCHERICHIA COLI (A)  Final      Susceptibility   Escherichia coli - MIC*    AMPICILLIN 4 SENSITIVE Sensitive     CEFAZOLIN <=4 SENSITIVE Sensitive     CEFTRIAXONE <=1 SENSITIVE Sensitive     CIPROFLOXACIN <=0.25 SENSITIVE Sensitive     GENTAMICIN <=1 SENSITIVE Sensitive     IMIPENEM <=0.25 SENSITIVE Sensitive     NITROFURANTOIN <=16 SENSITIVE Sensitive     TRIMETH/SULFA <=20 SENSITIVE Sensitive     AMPICILLIN/SULBACTAM <=2 SENSITIVE Sensitive     PIP/TAZO <=4 SENSITIVE Sensitive     Extended ESBL NEGATIVE Sensitive     * >=100,000  COLONIES/mL ESCHERICHIA COLI  Blood Culture (routine x 2)     Status: Abnormal   Collection Time: 08/03/17  8:24 PM  Result Value Ref Range Status   Specimen Description   Final    BLOOD LEFT ANTECUBITAL Performed at [facility] [address], [place], [place] [address]    Special Requests   Final    BOTTLES DRAWN AEROBIC AND ANAEROBIC Blood Culture adequate volume Performed at [facility] [address] [place], [place] [address]    Culture  Setup Time   Final    GRAM NEGATIVE RODS IN BOTH AEROBIC AND ANAEROBIC BOTTLES CRITICAL RESULT CALLED TO, READ BACK BY AND VERIFIED WITH: TPICKERING,PHARMD _0  08/04/17 BY LHOWARD Performed at [facility], [address] [place], [place] [address]    Culture SALMONELLA SPECIES (A)  Final   Report Status 08/06/2017 FINAL  Final   Organism ID, Bacteria SALMONELLA SPECIES  Final      Susceptibility   Salmonella species - MIC*    AMPICILLIN <=2 SENSITIVE Sensitive     LEVOFLOXACIN <=0.12 SENSITIVE Sensitive     TRIMETH/SULFA <=20 SENSITIVE Sensitive     * SALMONELLA SPECIES  Blood Culture (routine x 2)     Status: Abnormal   Collection Time: 08/03/17  8:24 PM  Result Value Ref Range Status   Specimen Description   Final    BLOOD RIGHT ANTECUBITAL Performed at [facility] [address] [place], [place] [address]    Special Requests   Final    BOTTLES DRAWN AEROBIC AND ANAEROBIC Blood Culture adequate volume Performed at [facility] [address]  [place], [place] [address]    Culture  Setup Time   Final    GRAM NEGATIVE RODS IN BOTH AEROBIC AND ANAEROBIC BOTTLES CRITICAL VALUE NOTED.  VALUE IS CONSISTENT WITH PREVIOUSLY REPORTED AND CALLED VALUE.    Culture (A)  Final    SALMONELLA SPECIES SUSCEPTIBILITIES PERFORMED ON PREVIOUS CULTURE WITHIN THE LAST 5 DAYS. Performed at [facility], [place] [address], [place], [place] [address]     Report Status 08/06/2017 FINAL  Final  Blood Culture ID Panel (Reflexed)     Status: Abnormal   Collection Time: 08/03/17  8:24 PM  Result Value Ref Range Status   Enterococcus species NOT DETECTED NOT DETECTED Final   Listeria monocytogenes NOT DETECTED NOT DETECTED Final   Staphylococcus species NOT DETECTED NOT DETECTED Final   Staphylococcus aureus NOT DETECTED NOT DETECTED Final   Streptococcus species NOT DETECTED NOT DETECTED Final   Streptococcus agalactiae NOT DETECTED NOT DETECTED Final   Streptococcus pneumoniae NOT DETECTED NOT DETECTED Final   Streptococcus pyogenes NOT DETECTED NOT DETECTED Final   Acinetobacter baumannii NOT DETECTED NOT DETECTED Final   Enterobacteriaceae species DETECTED (A) NOT DETECTED Final    Comment: Enterobacteriaceae represent a large family of gram negative bacteria, not a single organism. Refer to culture for further identification. CRITICAL RESULT CALLED TO, READ BACK BY AND VERIFIED WITH: TPICKERING,PHARMD _0  08/04/17 BY LHOWARD    Enterobacter cloacae complex NOT DETECTED NOT DETECTED Final   Escherichia coli NOT DETECTED NOT DETECTED Final   Klebsiella oxytoca NOT DETECTED NOT DETECTED Final   Klebsiella pneumoniae NOT DETECTED NOT DETECTED Final   Proteus species NOT DETECTED NOT DETECTED Final   Serratia marcescens NOT DETECTED NOT DETECTED Final   Carbapenem resistance NOT DETECTED NOT DETECTED Final   Haemophilus influenzae NOT DETECTED NOT DETECTED Final   Neisseria meningitidis NOT DETECTED NOT DETECTED Final   Pseudomonas aeruginosa NOT DETECTED NOT DETECTED Final   Candida albicans NOT DETECTED NOT DETECTED Final   Candida glabrata NOT DETECTED NOT DETECTED Final   Candida krusei NOT DETECTED NOT DETECTED Final   Candida parapsilosis NOT DETECTED NOT DETECTED Final   Candida tropicalis NOT DETECTED NOT DETECTED Final    Comment: Performed at [facility], [place] [address], [place], [place] [address]  C difficile quick  scan w PCR reflex     Status: None   Collection Time: 08/04/17  1:59 AM  Result Value Ref Range Status   C Diff antigen NEGATIVE NEGATIVE Final   C Diff toxin NEGATIVE NEGATIVE Final   C Diff interpretation No C. difficile detected.  Final    Comment: Performed at [facility], [place] [address], [place], [place] [address]  Culture, blood (Routine X 2) w Reflex to ID Panel     Status: None (Preliminary result)   Collection Time: 08/06/17 10:46 AM  Result Value Ref Range Status   Specimen Description   Final    BLOOD RIGHT ANTECUBITAL Performed at [facility] [address] [place], [place] [address]    Special Requests   Final    AEROBIC BOTTLE ONLY Blood Culture adequate volume Performed at [facility] [address], [place], [place] [address]    Culture   Final    NO GROWTH 1 DAY Performed at [facility], [place] [address] [place], [place] [address]    Report Status PENDING  Incomplete  Culture, blood (Routine X 2) w Reflex to ID Panel  Status: None (Preliminary result)   Collection Time: 08/06/17 10:46 AM  Result Value Ref Range Status   Specimen Description   Final    BLOOD RIGHT ANTECUBITAL Performed at [facility] [address], [place], [place] [address]    Special Requests   Final    BOTTLES DRAWN AEROBIC AND ANAEROBIC Blood Culture adequate volume Performed at [facility] [address] [place], [place] [address]    Culture   Final    NO GROWTH 1 DAY Performed at [facility], [place] [address] [place], [place] [address]    Report Status PENDING  Incomplete       Holloway   Subjective    Allen Holloway has no new concerns, no dysuria, No fever  Or chills , no nausea, vomiting, diarrhea,        Patient has been seen and examined prior to discharge   Objective   Blood pressure 116/63, pulse 73, temperature 97.8 F (36.6 C), temperature  source Oral, resp. rate (!) 8, height 5' 8" (1.727 m), weight 91.2 kg (201 lb 1 oz), SpO2 100 %.   Intake/Output Summary (Last 24 hours) at 08/07/2017 1552 Last data filed at 08/07/2017 1535 Gross per 24 hour  Intake 1640 ml  Output -  Net 1640 ml   Exam Gen:- Awake Alert,  In no apparent distress  HEENT:- [place].AT, No sclera icterus Neck-Supple Neck,No JVD,.  Lungs-  CTAB , no wheezing CV- S1, S2 normal Abd-  +ve B.Sounds, Abd Soft, No tenderness,    Extremity/Skin:- No  edema,   Good pulses  Psych-affect is appropriate, oriented x3 Neuro-no new focal deficits, no tremors   Data Review   CBC w Diff:  Lab Results  Component Value Date   WBC 6.2 08/07/2017   HGB 10.0 (L) 08/07/2017   HCT 32.5 (L) 08/07/2017   PLT 167 08/07/2017   LYMPHOPCT 14 08/03/2017   MONOPCT 5 08/03/2017   EOSPCT 0 08/03/2017   BASOPCT 0 08/03/2017   CMP:  Lab Results  Component Value Date   NA 140 08/07/2017   K 4.1 08/07/2017   CL 108 08/07/2017   CO2 22 08/07/2017   BUN 31 (H) 08/07/2017   CREATININE 2.17 (H) 08/07/2017   PROT 6.6 08/03/2017   ALBUMIN 3.5 08/03/2017   BILITOT 1.1 08/03/2017   ALKPHOS 82 08/03/2017   AST 28 08/03/2017   ALT 17 08/03/2017   Total Discharge time is about 33 minutes  Roxan Hockey M.D on 08/07/2017 at 3:52 PM  [facility]   Office  [phone]  Voice Recognition Viviann Spare dictation system was used to create this note, attempts have been made to correct errors. Please contact the author with questions and/or clarifications.

## 2017-08-07 NOTE — Discharge Instructions (Signed)
1)Take Medications as Prescribed 2)Follow-up with your primary care doctor in 1 week for reevaluation and repeat kidney test (BMP) 3)Follow-up with PCP for repeat CBC, consider further anemia workup if repeat CBC/complete blood count shows low hemoglobin and/or stool occult blood remains positive after resolution of Gastrointestinal symptoms.

## 2017-08-09 ENCOUNTER — Telehealth: Payer: Self-pay

## 2017-08-09 NOTE — Telephone Encounter (Signed)
LM requesting call back to complete TCM and confirm hosp f/u.

## 2017-08-11 LAB — CULTURE, BLOOD (ROUTINE X 2)
CULTURE: NO GROWTH
CULTURE: NO GROWTH
Special Requests: ADEQUATE
Special Requests: ADEQUATE

## 2017-08-13 NOTE — Telephone Encounter (Signed)
LM requesting CB to complete TCM and confirm hospital f/u.

## 2017-08-14 NOTE — Telephone Encounter (Signed)
LM requesting call back. Pt scheduled for hosp f/u on 08/15/17.

## 2017-08-15 ENCOUNTER — Encounter: Payer: Self-pay | Admitting: Family Medicine

## 2017-08-15 ENCOUNTER — Ambulatory Visit (INDEPENDENT_AMBULATORY_CARE_PROVIDER_SITE_OTHER): Payer: Medicare Other | Admitting: Family Medicine

## 2017-08-15 VITALS — BP 127/69 | HR 67 | Temp 97.3°F | Resp 16 | Ht 67.5 in | Wt 202.5 lb

## 2017-08-15 DIAGNOSIS — N390 Urinary tract infection, site not specified: Secondary | ICD-10-CM

## 2017-08-15 DIAGNOSIS — K922 Gastrointestinal hemorrhage, unspecified: Secondary | ICD-10-CM

## 2017-08-15 DIAGNOSIS — D649 Anemia, unspecified: Secondary | ICD-10-CM

## 2017-08-15 DIAGNOSIS — B9620 Unspecified Escherichia coli [E. coli] as the cause of diseases classified elsewhere: Secondary | ICD-10-CM | POA: Diagnosis not present

## 2017-08-15 DIAGNOSIS — I480 Paroxysmal atrial fibrillation: Secondary | ICD-10-CM | POA: Diagnosis not present

## 2017-08-15 DIAGNOSIS — A021 Salmonella sepsis: Secondary | ICD-10-CM

## 2017-08-15 DIAGNOSIS — N183 Chronic kidney disease, stage 3 unspecified: Secondary | ICD-10-CM

## 2017-08-15 LAB — FERRITIN: Ferritin: 29.3 ng/mL (ref 22.0–322.0)

## 2017-08-15 LAB — VITAMIN B12: VITAMIN B 12: 473 pg/mL (ref 211–911)

## 2017-08-15 LAB — CBC
HCT: 34.6 % — ABNORMAL LOW (ref 39.0–52.0)
Hemoglobin: 11.2 g/dL — ABNORMAL LOW (ref 13.0–17.0)
MCHC: 32.3 g/dL (ref 30.0–36.0)
MCV: 86.4 fl (ref 78.0–100.0)
PLATELETS: 276 10*3/uL (ref 150.0–400.0)
RBC: 4.01 Mil/uL — ABNORMAL LOW (ref 4.22–5.81)
RDW: 17.1 % — ABNORMAL HIGH (ref 11.5–15.5)
WBC: 11.3 10*3/uL — ABNORMAL HIGH (ref 4.0–10.5)

## 2017-08-15 LAB — BASIC METABOLIC PANEL
BUN: 37 mg/dL — AB (ref 6–23)
CO2: 28 mEq/L (ref 19–32)
Calcium: 9.4 mg/dL (ref 8.4–10.5)
Chloride: 103 mEq/L (ref 96–112)
Creatinine, Ser: 3.07 mg/dL — ABNORMAL HIGH (ref 0.40–1.50)
GFR: 20.49 mL/min — AB (ref >60.00)
GLUCOSE: 106 mg/dL — AB (ref 70–99)
POTASSIUM: 4.4 meq/L (ref 3.5–5.1)
Sodium: 137 mEq/L (ref 135–145)

## 2017-08-15 LAB — IRON: Iron: 59 ug/dL (ref 42–165)

## 2017-08-15 MED ORDER — MUPIROCIN 2 % EX OINT: 1.0000 "application " | TOPICAL_OINTMENT | Freq: Three times a day (TID) | CUTANEOUS | 1 refills | Status: DC

## 2017-08-15 NOTE — Progress Notes (Signed)
08/15/2017  CC:  Chief Complaint  Patient presents with  . Hospitalization Follow-up    TCM    Patient is a 84 y.o. Caucasian male who presents for  hospital follow up. Dates hospitalized: 4/12-4/16, 2019. Days since d/c from hospital: 8d Patient was discharged from hospital to home. Reason for admission to hospital: fever, severe weakness, diarrhea, malaise. He was found to have salmonella bacteremia/sepsis on blood clx AND e.coli UTI. ID recommended continuation of rocephin while in hosp, then 7d of cipro after d/c. He did have Hb drop + hemoccult pos, but in the context of salmonella GE the plan is to hold off on GI eval unless Hb continues to drop and hemoccult remains + after GI sx's have resolved. He was found to have new onset A-fib.  CHADVASc score 5, anticoag discussed with pt/family and they decided against systemic anticoag at this time: he was d/c'd home on 162mg  ASA qd.  I have reviewed patient's discharge summary plus pertinent specific notes, labs, and imaging from the hospitalization.    Feeling great---back to baseline.  No diarrhea.  No abd pain, no n/v, no fevers.  No dysuria, no hematuria.  No dizziness, no SOB, no CP.  No palpitations or heart racing.   He finished his cipro yesterday.  He is taking a probiotic daily.  Having normal, formed, brown stool. Doesn't recall any suspicious food eaten prior to getting ill.  Medication reconciliation was done today and patient is taking meds as recommended by discharging hospitalist/specialist.    PMH:  Past Medical History:  Diagnosis Date  . CAD (coronary artery disease)   . Carotid bruit    60-79% bilat In carot sten, stable over serial exams.  04/2015 stable 66-06% RICA, 30-16% LICA.  Repeat 1 yr.  . Cholelithiasis   . Chronic diastolic heart failure ([place])    CHF clinic  . Chronic renal insufficiency, stage III (moderate) (HCC)    Stage III/IV--GFR 25-30 ml/min.  . 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  Avoid BB/verap/dilt  . COPD (chronic obstructive pulmonary disease) ([place])   . Diabetes mellitus without complication ([place])    0/1093 HbA1c 6.8%  . Diverticulosis   . GERD (gastroesophageal reflux disease)   . Hyperlipidemia   . Hypertension   . Hypothyroidism   . Lumbago   . Osteoarthritis   . Right hip pain    Trochanteric bursitis: Dr. Alvan Dame injected this 09/2015.  Hip x-rays normal at Dr. Aurea Graff office.  . Venous insufficiency of both lower extremities     PSH:  Past Surgical History:  Procedure Laterality Date  . APPENDECTOMY    . CORONARY ARTERY BYPASS GRAFT  2005   LIMA-LAD,SVG-PDA,SVG-D1,SVG-OM.  Myoview (9/08) showed EF63%,no scaror ischemia.  . TRANSTHORACIC ECHOCARDIOGRAM  01/29/2015; 03/08/17   2016 EF 55-60%, normal LV function and wall motion.  Mod tricusp regurg.  2018 EF 55-60%, normal wall motion, mod tricusp regurg.  Pulmonary arteries: Systolic pressure was moderately increased (secondary to DD).    MEDS:  Outpatient Medications Prior to Visit  Medication Sig Dispense Refill  . aspirin 325 MG tablet Take 162.5 mg by mouth daily. TAKE 1/2 TAB     . atorvastatin (LIPITOR) 20 MG tablet Take 10 mg by mouth daily.    . Cholecalciferol (VITAMIN D3) 1000 UNITS tablet Take 1,000 Units by mouth daily.      Marland Kitchen  Coenzyme Q10 200 MG TABS Take 1 tablet by mouth daily.   0  . furosemide (LASIX) 40 MG tablet Take 40 mg by mouth daily.    . Garlic 4034 MG CAPS Take 1 capsule by mouth daily.    [name] Oil 350 MG CAPS Take 1 capsule by mouth daily.    Marland Kitchen lactobacillus acidophilus & bulgar (LACTINEX) chewable tablet Chew 1 tablet by mouth 3 (three) times daily with meals. 60 tablet 0  . levothyroxine (SYNTHROID, LEVOTHROID) 100 MCG tablet Take 100 mcg by mouth daily.      Marland Kitchen omeprazole (PRILOSEC) 20 MG capsule Take 20 mg by mouth daily.     .  ciprofloxacin (CIPRO) 500 MG tablet Take 1 tablet (500 mg total) by mouth daily with breakfast. (Patient not taking: Reported on 08/15/2017) 7 tablet 0   No facility-administered medications prior to visit.    EXAM: BP 127/69 (BP Location: Left Arm, Patient Position: Sitting, Cuff Size: Normal)   Pulse 67   Temp (!) 97.3 F (36.3 C) (Oral)   Resp 16   Ht 5' 7.5" (1.715 m)   Wt 202 lb 8 oz (91.9 kg)   SpO2 97%   BMI 31.25 kg/m  Gen: Alert, well appearing.  Patient is oriented to person, place, time, and situation. AFFECT: pleasant, lucid thought and speech. VQQ:VZDG: no injection, icteris, swelling, or exudate.  EOMI, PERRLA. Mouth: lips without lesion/swelling.  Oral mucosa pink and moist. Oropharynx without erythema, exudate, or swelling.  CV: RRR (I only heard occ, brief rhythm irregularity for the 45 seconds that I listened to his heart), no m/r/g.   LUNGS: CTA bilat, nonlabored resps, good aeration in all lung fields. EXT: 3-4+ pitting edema both LL's.  No skin breakdown. Rectal exam: markedly diminished rectal tone.  No stool in vault.  No mass. Stool wipings light brown---hemoccult neg today.   Pertinent labs/imaging Lab Results  Component Value Date   TSH 0.886 08/04/2017   Lab Results  Component Value Date   WBC 6.2 08/07/2017   HGB 10.0 (L) 08/07/2017   HCT 32.5 (L) 08/07/2017   MCV 90.5 08/07/2017   PLT 167 08/07/2017  No results found for: IRON, TIBC, FERRITIN No results found for: VITAMINB12  Lab Results  Component Value Date   CREATININE 2.17 (H) 08/07/2017   BUN 31 (H) 08/07/2017   NA 140 08/07/2017   K 4.1 08/07/2017   CL 108 08/07/2017   CO2 22 08/07/2017   Lab Results  Component Value Date   ALT 17 08/03/2017   AST 28 08/03/2017   ALKPHOS 82 08/03/2017   BILITOT 1.1 08/03/2017   Lab Results  Component Value Date   CHOL 104 01/24/2017   Lab Results  Component Value Date   HDL 41.70 01/24/2017   Lab Results  Component Value Date    LDLCALC 48 01/24/2017   Lab Results  Component Value Date   TRIG 75.0 01/24/2017   Lab Results  Component Value Date   CHOLHDL 3 01/24/2017   Lab Results  Component Value Date   HGBA1C 7.0 (H) 07/25/2017   Lab Results  Component Value Date   INR 1.18 08/03/2017   Hemoccult in office today: negative.  ASSESSMENT/PLAN:  1) Salmonella sepsis and e coli UTI: resolved s/p abx.  Has f/u with ID MD tomorrow.  2) New dx a-fib: regular rhythm on exam today.  Pt asymptomatic.  As per discussion with hospitalist/Cardiologist during recent admission, the plan is to take ASA 162  mg qd---no coumadin or DOAC.  3) Normocytic anemia, drop in baseline Hb noted in hospital, hemoccult positive in hospital in the context of salmonella GE. Symptoms have resolved.  Hemoccult in office today is negative. Recheck CBC and check iron panel and vit B12 level.  4) CRI stage III.  Recheck lytes/cr today.  An After Visit Summary was printed and given to the patient.  FOLLOW UP:  Keep f/u scheduled with me 10/2017.  Signed:  Crissie Sickles, MD           08/15/2017

## 2017-08-15 NOTE — Progress Notes (Signed)
Subjective:    Patient ID: Allen Holloway, male    DOB: 1926-08-03, 84 y.o.   MRN: [id number]  Chief Complaint  Patient presents with  . Salmonella bacteremia    HPI:  Allen Holloway is a 85 y.o. male who presents today for initial visit following hospitalization for Salmonella bacteremia.   Allen Holloway was evaluated in the ED and admitted to the hospital on 08/03/17 with the chief complaint of dizziness and weakness. Initial concern for UTI with UA positive for leukocytes, nitrites and hematuria with no urinary symptoms. Urine cultures eventually showed pansensitive E. Coli. Blood cultures were positive for Salmonella species. No evidence of endocarditis. ID was consulted for the bacteremia and at the time was continued on ceftriaxone with recommendations for high dose ciprofloxacin (renally dosed) for 7 days at discharge. Repeat blood cultures on 08/06/17 were negative. He is here today to follow up.   He reports taking the ciprofloxacin as prescribed and denies adverse side effects. Indicates that he feels better than when he was in the hospital. Denies any fevers, chills, night sweats, urinary symptoms or abdominal pain. He states that he is back to baseline.    Allergies  Allergen Reactions  . Amlodipine     edema  . Benazepril Hcl     REACTION: elev K      Outpatient Medications Prior to Visit  Medication Sig Dispense Refill  . aspirin 325 MG tablet Take 162.5 mg by mouth daily. TAKE 1/2 TAB     . atorvastatin (LIPITOR) 20 MG tablet Take 10 mg by mouth daily.    . Cholecalciferol (VITAMIN D3) 1000 UNITS tablet Take 1,000 Units by mouth daily.      . Coenzyme Q10 200 MG TABS Take 1 tablet by mouth daily.   0  . furosemide (LASIX) 40 MG tablet Take 40 mg by mouth daily.    . Garlic 4098 MG CAPS Take 1 capsule by mouth daily.    Allen Holloway 350 MG CAPS Take 1 capsule by mouth daily.    Marland Kitchen lactobacillus acidophilus & bulgar (LACTINEX) chewable tablet Chew 1 tablet by mouth 3  (three) times daily with meals. 60 tablet 0  . levothyroxine (SYNTHROID, LEVOTHROID) 100 MCG tablet Take 100 mcg by mouth daily.      . mupirocin ointment (BACTROBAN) 2 % Apply 1 application topically 3 (three) times daily. 22 g 1  . omeprazole (PRILOSEC) 20 MG capsule Take 20 mg by mouth daily.      No facility-administered medications prior to visit.      Past Medical History:  Diagnosis Date  . CAD (coronary artery disease)   . Carotid bruit    60-79% bilat In carot sten, stable over serial exams.  04/2015 stable 11-91% RICA, 47-82% LICA.  Repeat 1 yr.  . Cholelithiasis   . Chronic diastolic heart failure ([place])    CHF clinic  . Chronic renal insufficiency, stage III (moderate) (HCC)    Stage III/IV--GFR 25-30 ml/min.  . 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  Avoid BB/verap/dilt  . COPD (chronic obstructive pulmonary disease) ([place])   . Diabetes mellitus without complication ([place])    12/5619 HbA1c 6.8%  . Diverticulosis   . GERD (gastroesophageal reflux disease)   . Hyperlipidemia   . Hypertension   . Hypothyroidism   . Lumbago   .  Osteoarthritis   . Right hip pain    Trochanteric bursitis: Dr. Alvan Dame injected this 09/2015.  Hip x-rays normal at Dr. Aurea Graff office.  . Venous insufficiency of both lower extremities       Past Surgical History:  Procedure Laterality Date  . APPENDECTOMY    . CORONARY ARTERY BYPASS GRAFT  2005   LIMA-LAD,SVG-PDA,SVG-D1,SVG-OM.  Myoview (9/08) showed EF63%,no scaror ischemia.  . TRANSTHORACIC ECHOCARDIOGRAM  01/29/2015; 03/08/17   2016 EF 55-60%, normal LV function and wall motion.  Mod tricusp regurg.  2018 EF 55-60%, normal wall motion, mod tricusp regurg.  Pulmonary arteries: Systolic pressure was moderately increased (secondary to DD).      Family History  Problem Relation Age of Onset  .  Hyperlipidemia Unknown   . Hypertension Unknown   . Colon cancer Neg Hx       Social History   Socioeconomic History  . Marital status: Married    Spouse name: Not on file  . Number of children: Not on file  . Years of education: Not on file  . Highest education level: Not on file  Occupational History  . Occupation: retired  Scientific laboratory technician  . Financial resource strain: Not on file  . Food insecurity:    Worry: Not on file    Inability: Not on file  . Transportation needs:    Medical: Not on file    Non-medical: Not on file  Tobacco Use  . Smoking status: Former Research scientist (life sciences)  . Smokeless tobacco: Never Used  . Tobacco comment: quit 20-79yrs ago.  Substance and Sexual Activity  . Alcohol use: No  . Drug use: No  . Sexual activity: Not on file  Lifestyle  . Physical activity:    Days per week: Not on file    Minutes per session: Not on file  . Stress: Not on file  Relationships  . Social connections:    Talks on phone: Not on file    Gets together: Not on file    Attends religious service: Not on file    Active member of club or organization: Not on file    Attends meetings of clubs or organizations: Not on file    Relationship status: Not on file  . Intimate partner violence:    Fear of current or ex partner: Not on file    Emotionally abused: Not on file    Physically abused: Not on file    Forced sexual activity: Not on file  Other Topics Concern  . Not on file  Social History Narrative   Married, has 2 stepsons.   Occupation: retired truckdriver x 50 yrs.   Never smoked more than a few years when a teenager, otherwise is a nonsmoker.   No alcohol.   Gets wellness at [facility] q 1 yr.        Daily caffeine use--4 cups daily.      Review of Systems  Constitutional: Negative for chills and fever.  Respiratory: Negative for chest tightness and shortness of breath.   Cardiovascular: Negative for chest pain and palpitations.  Genitourinary: Negative for dysuria, flank  pain, frequency, hematuria and urgency.  Neurological: Negative for dizziness and weakness.       Objective:    BP (!) 160/73   Temp (!) 97 F (36.1 C)   Wt 203 lb (92.1 kg)   BMI 31.33 kg/m  Nursing note and vital signs reviewed.  Physical Exam  Constitutional: He is oriented to person, place, and time. He  appears well-developed and well-nourished. No distress.  Cardiovascular: Normal rate, regular rhythm, normal heart sounds and intact distal pulses. Exam reveals no gallop and no friction rub.  No murmur heard. Pulmonary/Chest: Effort normal and breath sounds normal. No stridor. No respiratory distress. He has no wheezes. He has no rales. He exhibits no tenderness.  Neurological: He is alert and oriented to person, place, and time.  Skin: Skin is warm and dry.  Psychiatric: He has a normal mood and affect. His behavior is normal. Judgment and thought content normal.        Assessment & Plan:   Problem List Items Addressed This Visit      Genitourinary   Chronic renal insufficiency, stage 3 (moderate) (HCC)    Chronic renal impairment appears to be worsening. May need nephrology referral. Will defer to PCP for further work up.         Other   Salmonella bacteremia/Sepsis - Primary    Completed course of ciprofloxacin with no adverse side effects. No further symptoms following hospitalization. Previous repeat blood cultures were clear prior to leaving the hospital. No further treatment is necessary at this time. Follow up if symptoms return.           I am having Talbert C. Inlow maintain his levothyroxine, omeprazole, cholecalciferol, Coenzyme I01, aspirin, Garlic, Krill Holloway, atorvastatin, furosemide, lactobacillus acidophilus & bulgar, and mupirocin ointment.   Follow-up:  As needed.   Mauricio Po, [facility]

## 2017-08-16 ENCOUNTER — Ambulatory Visit (INDEPENDENT_AMBULATORY_CARE_PROVIDER_SITE_OTHER): Payer: Medicare Other | Admitting: Family

## 2017-08-16 ENCOUNTER — Encounter: Payer: Self-pay | Admitting: Family

## 2017-08-16 ENCOUNTER — Other Ambulatory Visit: Payer: Self-pay

## 2017-08-16 VITALS — BP 160/73 | Temp 97.0°F | Wt 203.0 lb

## 2017-08-16 DIAGNOSIS — R7881 Bacteremia: Secondary | ICD-10-CM | POA: Diagnosis not present

## 2017-08-16 DIAGNOSIS — N183 Chronic kidney disease, stage 3 unspecified: Secondary | ICD-10-CM

## 2017-08-16 DIAGNOSIS — N179 Acute kidney failure, unspecified: Secondary | ICD-10-CM

## 2017-08-16 LAB — IRON AND TIBC
IRON SATURATION: 15 % (ref 15–55)
IRON: 55 ug/dL (ref 38–169)
Total Iron Binding Capacity: 371 ug/dL (ref 250–450)
UIBC: 316 ug/dL (ref 111–343)

## 2017-08-16 NOTE — Assessment & Plan Note (Signed)
Completed course of ciprofloxacin with no adverse side effects. No further symptoms following hospitalization. Previous repeat blood cultures were clear prior to leaving the hospital. No further treatment is necessary at this time. Follow up if symptoms return.

## 2017-08-16 NOTE — Patient Instructions (Signed)
Nice to meet you!  Your infection has been cleared and there is no need for additional treatment.  No follow ups are needed.

## 2017-08-16 NOTE — Assessment & Plan Note (Signed)
Chronic renal impairment appears to be worsening. May need nephrology referral. Will defer to PCP for further work up.

## 2017-08-21 LAB — CULTURE, BLOOD (ROUTINE X 2): Special Requests: ADEQUATE

## 2017-08-27 ENCOUNTER — Other Ambulatory Visit (INDEPENDENT_AMBULATORY_CARE_PROVIDER_SITE_OTHER): Payer: Medicare Other

## 2017-08-27 DIAGNOSIS — N179 Acute kidney failure, unspecified: Secondary | ICD-10-CM | POA: Diagnosis not present

## 2017-08-27 LAB — BASIC METABOLIC PANEL
BUN: 38 mg/dL — ABNORMAL HIGH (ref 6–23)
CHLORIDE: 108 meq/L (ref 96–112)
CO2: 26 mEq/L (ref 19–32)
Calcium: 9.2 mg/dL (ref 8.4–10.5)
Creatinine, Ser: 2.62 mg/dL — ABNORMAL HIGH (ref 0.40–1.50)
GFR: 24.6 mL/min — AB (ref >60.00)
Glucose, Bld: 113 mg/dL — ABNORMAL HIGH (ref 70–99)
POTASSIUM: 4.7 meq/L (ref 3.5–5.1)
SODIUM: 142 meq/L (ref 135–145)

## 2017-11-01 ENCOUNTER — Ambulatory Visit (INDEPENDENT_AMBULATORY_CARE_PROVIDER_SITE_OTHER): Payer: Medicare Other | Admitting: Family Medicine

## 2017-11-01 ENCOUNTER — Encounter: Payer: Self-pay | Admitting: Family Medicine

## 2017-11-01 VITALS — BP 131/62 | HR 46 | Temp 97.6°F | Resp 16 | Ht 67.5 in | Wt 200.4 lb

## 2017-11-01 DIAGNOSIS — M7581 Other shoulder lesions, right shoulder: Secondary | ICD-10-CM | POA: Diagnosis not present

## 2017-11-01 DIAGNOSIS — N183 Chronic kidney disease, stage 3 unspecified: Secondary | ICD-10-CM

## 2017-11-01 DIAGNOSIS — E119 Type 2 diabetes mellitus without complications: Secondary | ICD-10-CM

## 2017-11-01 DIAGNOSIS — I10 Essential (primary) hypertension: Secondary | ICD-10-CM | POA: Diagnosis not present

## 2017-11-01 DIAGNOSIS — D649 Anemia, unspecified: Secondary | ICD-10-CM | POA: Diagnosis not present

## 2017-11-01 LAB — CBC WITH DIFFERENTIAL/PLATELET
BASOS ABS: 0 10*3/uL (ref 0.0–0.1)
Basophils Relative: 0.2 % (ref 0.0–3.0)
EOS ABS: 0.2 10*3/uL (ref 0.0–0.7)
Eosinophils Relative: 1.8 % (ref 0.0–5.0)
HCT: 37.1 % — ABNORMAL LOW (ref 39.0–52.0)
HEMOGLOBIN: 11.9 g/dL — AB (ref 13.0–17.0)
LYMPHS ABS: 5.5 10*3/uL — AB (ref 0.7–4.0)
Lymphocytes Relative: 54.8 % — ABNORMAL HIGH (ref 12.0–46.0)
MCHC: 32 g/dL (ref 30.0–36.0)
MCV: 87.1 fl (ref 78.0–100.0)
MONO ABS: 0.8 10*3/uL (ref 0.1–1.0)
Monocytes Relative: 7.8 % (ref 3.0–12.0)
NEUTROS PCT: 35.4 % — AB (ref 43.0–77.0)
Neutro Abs: 3.5 10*3/uL (ref 1.4–7.7)
Platelets: 145 10*3/uL — ABNORMAL LOW (ref 150.0–400.0)
RBC: 4.26 Mil/uL (ref 4.22–5.81)
RDW: 16.9 % — ABNORMAL HIGH (ref 11.5–15.5)
WBC: 10.2 10*3/uL (ref 4.0–10.5)

## 2017-11-01 LAB — HEMOGLOBIN A1C: Hgb A1c MFr Bld: 6.9 % — ABNORMAL HIGH (ref 4.6–6.5)

## 2017-11-01 LAB — BASIC METABOLIC PANEL
BUN: 36 mg/dL — AB (ref 6–23)
CHLORIDE: 105 meq/L (ref 96–112)
CO2: 26 meq/L (ref 19–32)
CREATININE: 2.63 mg/dL — AB (ref 0.40–1.50)
Calcium: 9.6 mg/dL (ref 8.4–10.5)
GFR: 24.48 mL/min — ABNORMAL LOW (ref >60.00)
Glucose, Bld: 122 mg/dL — ABNORMAL HIGH (ref 70–99)
POTASSIUM: 5.4 meq/L — AB (ref 3.5–5.1)
Sodium: 139 mEq/L (ref 135–145)

## 2017-11-01 LAB — MICROALBUMIN / CREATININE URINE RATIO
Creatinine,U: 110.4 mg/dL
MICROALB UR: 3.4 mg/dL — AB (ref 0.0–1.9)
Microalb Creat Ratio: 3.1 mg/g (ref 0.0–30.0)

## 2017-11-01 NOTE — Progress Notes (Signed)
OFFICE VISIT  11/01/2017   CC:  Chief Complaint  Patient presents with  . Follow-up    RCI, pt is not fasting.     HPI:    Patient is a 84 y.o. Caucasian male who presents for 3 mo f/u DM 2, HTN, and CRI stage III.  Also, pt attends [facility], also has Type I 2nd degree AV block so BB/verp/dilt need to be avoided. ECHO 02/2017: LVEF/LV wall motion normal, mod tricusp regurg and mild pulm HTN.  He has NYHA class 3 sx's of CHF.  Since last routine f/u he had salmonella bacteremia and took full course of cipro, outpt f/u with ID went well--no further treatment needed.  DM 2: no home gluc monitoring, no polyuria or polydipsia.  No burning, tingling, or numbness in feet.  HTN: Home bp checks <130/80s.  HR's not recalled by pt.  CRI III/IV--discussed referral to nephrologist today + obtain renal u/s. Avoids NSAIDs.  Tries to hydrate well.  R shoulder pain onset 1 mo ago, no strain/trauma recalled.  Takes tylenol some.   ABduction motion hurts it.  Some pain when trying to go to sleep.  Blue Emu salve helps. No NSAIDs.    Past Medical History:  Diagnosis Date  . CAD (coronary artery disease)   . Carotid bruit    60-79% bilat In carot sten, stable over serial exams.  04/2015 stable 06-26% RICA, 94-85% LICA.  Repeat 1 yr.  . Cholelithiasis   . Chronic diastolic heart failure ([place])    CHF clinic  . Chronic renal insufficiency, stage III (moderate) (HCC)    Stage III/IV--GFR 25-30 ml/min.  . 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  Avoid BB/verap/dilt  . COPD (chronic obstructive pulmonary disease) ([place])   . Diabetes mellitus without complication ([place])    07/6268 HbA1c 6.8%  . Diverticulosis   . GERD (gastroesophageal reflux disease)   . Hyperlipidemia   . Hypertension   . Hypothyroidism   . Lumbago   . Osteoarthritis   .  Right hip pain    Trochanteric bursitis: Dr. Alvan Dame injected this 09/2015.  Hip x-rays normal at Dr. Aurea Graff office.  . Venous insufficiency of both lower extremities     Past Surgical History:  Procedure Laterality Date  . APPENDECTOMY    . CORONARY ARTERY BYPASS GRAFT  2005   LIMA-LAD,SVG-PDA,SVG-D1,SVG-OM.  Myoview (9/08) showed EF63%,no scaror ischemia.  . TRANSTHORACIC ECHOCARDIOGRAM  01/29/2015; 03/08/17   2016 EF 55-60%, normal LV function and wall motion.  Mod tricusp regurg.  2018 EF 55-60%, normal wall motion, mod tricusp regurg.  Pulmonary arteries: Systolic pressure was moderately increased (secondary to DD).    Outpatient Medications Prior to Visit  Medication Sig Dispense Refill  . aspirin 325 MG tablet Take 162.5 mg by mouth daily. TAKE 1/2 TAB     . atorvastatin (LIPITOR) 20 MG tablet Take 10 mg by mouth daily.    . Cholecalciferol (VITAMIN D3) 1000 UNITS tablet Take 1,000 Units by mouth daily.      . Coenzyme Q10 200 MG TABS Take 1 tablet by mouth daily.   0  . furosemide (LASIX) 40 MG tablet Take 40 mg by mouth daily.    . Garlic 3500 MG CAPS Take 1 capsule by mouth daily.    [name] Oil 350 MG CAPS Take 1 capsule  by mouth daily.    Marland Kitchen levothyroxine (SYNTHROID, LEVOTHROID) 100 MCG tablet Take 100 mcg by mouth daily.      . mupirocin ointment (BACTROBAN) 2 % Apply 1 application topically 3 (three) times daily. 22 g 1  . omeprazole (PRILOSEC) 20 MG capsule Take 20 mg by mouth daily.     Marland Kitchen lactobacillus acidophilus & bulgar (LACTINEX) chewable tablet Chew 1 tablet by mouth 3 (three) times daily with meals. (Patient not taking: Reported on 11/01/2017) 60 tablet 0   No facility-administered medications prior to visit.     Allergies  Allergen Reactions  . Amlodipine     edema  . Benazepril Hcl     REACTION: elev K    ROS As per HPI  PE: Blood pressure 131/62, pulse (!) 46, temperature 97.6 F (36.4 C), temperature source Oral, resp. rate 16, height 5' 7.5" (1.715 m),  weight 200 lb 6 oz (90.9 kg), SpO2 100 %. Body mass index is 30.92 kg/m.  Gen: Alert, well appearing.  Patient is oriented to person, place, time, and situation. AFFECT: pleasant, lucid thought and speech. CV: RR, brady, no m/r/g Chest is clear, no wheezing or rales. Normal symmetric air entry throughout both lung fields. No chest wall deformities or tenderness. EXT: no clubbing or cyanosis, 3+ R LL pitting, 2+ L LL pitting. Foot exam -mild ankles swelling.  No tenderness or skin or vascular lesions. Color is mildly violaceous hue and temperature is normal. Sensation decreased in toes.. Peripheral pulses are palpable. Toenails are hypertrophic. R shoulder with mild TTP around hook of acromion, otherwise nontender.  ROM fully intact, mild pain at mid ABduction. ER and IR are fine.  Speeds/Yergason's neg.  UE strength 5/5 bilat.  LABS:  Lab Results  Component Value Date   TSH 0.886 08/04/2017   Lab Results  Component Value Date   WBC 11.3 (H) 08/15/2017   HGB 11.2 (L) 08/15/2017   HCT 34.6 (L) 08/15/2017   MCV 86.4 08/15/2017   PLT 276.0 08/15/2017   Lab Results  Component Value Date   IRON 59 08/15/2017   IRON 55 08/15/2017   TIBC 371 08/15/2017   FERRITIN 29.3 08/15/2017    Lab Results  Component Value Date   CREATININE 2.62 (H) 08/27/2017   BUN 38 (H) 08/27/2017   NA 142 08/27/2017   K 4.7 08/27/2017   CL 108 08/27/2017   CO2 26 08/27/2017   Lab Results  Component Value Date   ALT 17 08/03/2017   AST 28 08/03/2017   ALKPHOS 82 08/03/2017   BILITOT 1.1 08/03/2017   Lab Results  Component Value Date   CHOL 104 01/24/2017   Lab Results  Component Value Date   HDL 41.70 01/24/2017   Lab Results  Component Value Date   LDLCALC 48 01/24/2017   Lab Results  Component Value Date   TRIG 75.0 01/24/2017   Lab Results  Component Value Date   CHOLHDL 3 01/24/2017   Lab Results  Component Value Date   PSA 0.76 12/31/2013   PSA 0.68 10/09/2012   PSA 0.49  09/30/2009   Lab Results  Component Value Date   HGBA1C 7.0 (H) 07/25/2017    IMPRESSION AND PLAN:  1) Chronic renal insufficiency, GFR low-to-mid 20s. Refer to nephrology, obtain renal u/s. Continue to work on RF mgmt---bp, cholesterol, and glucose control + avoid nephrotoxic agents.  2) Mild R RC tendonitis. No NSAIDs.  Discussed some home exercises, gave handout. If progresses he may need PT +/-  steroid injection and +/- shoulder x-ray to eval for component of osteoarthritis.  3) DM 2: has been doing fine with diet only. CBC, BMET, A1c today.  Urine microalb/cr today. Feet exam showed some loss of protective sensation in toes.  4) HTN: The current medical regimen is effective;  continue present plan and medications.  5) Normocytic anemia, chronic.  Suspect anemia of chronic dz + anemia of CRI. CBC monitoring today.  An After Visit Summary was printed and given to the patient.  FOLLOW UP: 3 mo RCI  Signed:  Crissie Sickles, MD           11/01/2017

## 2017-11-06 ENCOUNTER — Ambulatory Visit (HOSPITAL_BASED_OUTPATIENT_CLINIC_OR_DEPARTMENT_OTHER)
Admission: RE | Admit: 2017-11-06 | Discharge: 2017-11-06 | Disposition: A | Discharge status: Home / Self Care | Payer: Medicare Other | Source: Ambulatory Visit | Attending: Family Medicine | Admitting: Family Medicine

## 2017-11-06 ENCOUNTER — Encounter: Payer: Self-pay | Admitting: Family Medicine

## 2017-11-06 DIAGNOSIS — N183 Chronic kidney disease, stage 3 (moderate): Secondary | ICD-10-CM | POA: Diagnosis not present

## 2017-11-06 DIAGNOSIS — N189 Chronic kidney disease, unspecified: Secondary | ICD-10-CM | POA: Diagnosis not present

## 2017-11-07 ENCOUNTER — Other Ambulatory Visit: Payer: Self-pay | Admitting: *Deleted

## 2017-11-07 DIAGNOSIS — E875 Hyperkalemia: Secondary | ICD-10-CM

## 2017-11-13 DIAGNOSIS — B351 Tinea unguium: Secondary | ICD-10-CM | POA: Diagnosis not present

## 2017-11-13 DIAGNOSIS — L84 Corns and callosities: Secondary | ICD-10-CM | POA: Diagnosis not present

## 2017-11-13 DIAGNOSIS — M79676 Pain in unspecified toe(s): Secondary | ICD-10-CM | POA: Diagnosis not present

## 2017-11-13 DIAGNOSIS — I70203 Unspecified atherosclerosis of native arteries of extremities, bilateral legs: Secondary | ICD-10-CM | POA: Diagnosis not present

## 2017-11-19 NOTE — Progress Notes (Deleted)
Subjective:   Allen Holloway is a 84 y.o. male who presents for Medicare Annual/Subsequent preventive examination.  Review of Systems:  No ROS.  Medicare Wellness Visit. Additional risk factors are reflected in the social history.    Sleep patterns:  Home Safety/Smoke Alarms: Feels safe in home. Smoke alarms in place.  Living environment; residence and Firearm Safety: Lives with wife in 1 story home.  Seat Belt Safety/Bike Helmet: Wears seat belt.   Male:   CCS-Colonoscopy 12/29/2009, diverticula.     PSA-  Lab Results  Component Value Date   PSA 0.76 12/31/2013   PSA 0.68 10/09/2012   PSA 0.49 09/30/2009       Objective:    Vitals: There were no vitals taken for this visit.  There is no height or weight on file to calculate BMI.  Advanced Directives 08/04/2017 08/03/2017 11/13/2016 09/06/2015  Does Patient Have a Medical Advance Directive? No No Yes Yes  Type of Advance Directive - Public librarian;Living will Living will  Copy of [place] in Chart? - - Yes Yes  Would patient like information on creating a medical advance directive? No - Patient declined - - -    Tobacco Social History   Tobacco Use  Smoking Status Former Smoker  Smokeless Tobacco Never Used  Tobacco Comment   quit 20-38yrs ago.     Counseling given: Not Answered Comment: quit 20-27yrs ago.   Past Medical History:  Diagnosis Date  . CAD (coronary artery disease)   . Carotid bruit    60-79% bilat In carot sten, stable over serial exams.  04/2015 stable 24-40% RICA, 10-27% LICA.  Repeat 1 yr.  . Cholelithiasis   . Chronic diastolic heart failure ([place])    CHF clinic  . Chronic renal insufficiency, stage III (moderate) (HCC)    Stage III/IV--GFR 25-30 ml/min.  11/05/17 renal u/s medical renal dz  . 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  Avoid BB/verap/dilt  . COPD (chronic obstructive pulmonary disease) ([place])   . Diabetes mellitus without complication ([place])    05/5364 HbA1c 6.8%  . Diverticulosis   . GERD (gastroesophageal reflux disease)   . Hyperlipidemia   . Hypertension   . Hypothyroidism   . Lumbago   . Osteoarthritis   . Right hip pain    Trochanteric bursitis: Dr. Alvan Dame injected this 09/2015.  Hip x-rays normal at Dr. Aurea Graff office.  . Venous insufficiency of both lower extremities    Past Surgical History:  Procedure Laterality Date  . APPENDECTOMY    . CORONARY ARTERY BYPASS GRAFT  2005   LIMA-LAD,SVG-PDA,SVG-D1,SVG-OM.  Myoview (9/08) showed EF63%,no scaror ischemia.  . TRANSTHORACIC ECHOCARDIOGRAM  01/29/2015; 03/08/17   2016 EF 55-60%, normal LV function and wall motion.  Mod tricusp regurg.  2018 EF 55-60%, normal wall motion, mod tricusp regurg.  Pulmonary arteries: Systolic pressure was moderately increased (secondary to DD).   Family History  Problem Relation Age of Onset  . Hyperlipidemia Unknown   . Hypertension Unknown   . Colon cancer Neg Hx    Social History   Socioeconomic History  . Marital status: Married    Spouse name: Not on file  . Number of children: Not on file  . Years of education: Not on file  . Highest education level: Not on file  Occupational History  . Occupation: retired  Scientific laboratory technician  . Financial resource strain: Not on file  . Food insecurity:    Worry: Not on file    Inability: Not on file  . Transportation needs:    Medical: Not on file    Non-medical: Not on file  Tobacco Use  . Smoking status: Former Research scientist (life sciences)  . Smokeless tobacco: Never Used  . Tobacco comment: quit 20-43yrs ago.  Substance and Sexual Activity  . Alcohol use: No  . Drug use: No  . Sexual activity: Not on file  Lifestyle  . Physical activity:    Days per week: Not on file    Minutes per session: Not on file  . Stress: Not on file  Relationships  . Social  connections:    Talks on phone: Not on file    Gets together: Not on file    Attends religious service: Not on file    Active member of club or organization: Not on file    Attends meetings of clubs or organizations: Not on file    Relationship status: Not on file  Other Topics Concern  . Not on file  Social History Narrative   Married, has 2 stepsons.   Occupation: retired truckdriver x 50 yrs.   Never smoked more than a few years when a teenager, otherwise is a nonsmoker.   No alcohol.   [facility] 1 yr.        Daily caffeine use--4 cups daily.    Outpatient Encounter Medications as of 11/20/2017  Medication Sig  . aspirin 325 MG tablet Take 162.5 mg by mouth daily. TAKE 1/2 TAB   . atorvastatin (LIPITOR) 20 MG tablet Take 10 mg by mouth daily.  . Cholecalciferol (VITAMIN D3) 1000 UNITS tablet Take 1,000 Units by mouth daily.    . Coenzyme Q10 200 MG TABS Take 1 tablet by mouth daily.   . furosemide (LASIX) 40 MG tablet Take 40 mg by mouth daily.  . Garlic 2542 MG CAPS Take 1 capsule by mouth daily.  [name] Oil 350 MG CAPS Take 1 capsule by mouth daily.  Marland Kitchen levothyroxine (SYNTHROID, LEVOTHROID) 100 MCG tablet Take 100 mcg by mouth daily.    . mupirocin ointment (BACTROBAN) 2 % Apply 1 application topically 3 (three) times daily.  Marland Kitchen omeprazole (PRILOSEC) 20 MG capsule Take 20 mg by mouth daily.    No facility-administered encounter medications on file as of 11/20/2017.     Activities of Daily Living In your present state of health, do you have any difficulty performing the following activities: 08/04/2017  Hearing? N  Vision? N  Difficulty concentrating or making decisions? N  Walking or climbing stairs? Y  Dressing or bathing? N  Doing errands, shopping? Y  Some recent data might be hidden    Patient Care Team: Tammi Sou, MD as PCP - General (Family Medicine) Larey Dresser, MD (Cardiology) Inda Castle, MD (Inactive) (Gastroenterology) Elsie Saas, MD as Consulting Physician (Orthopedic Surgery) Melrose Nakayama, MD as Consulting Physician (Cardiothoracic Surgery) Paralee Cancel, MD as Consulting Physician (Orthopedic Surgery) Colen Darling   Assessment:   This is a routine wellness examination for Allen Holloway.  Exercise Activities and Dietary recommendations   Diet (meal preparation, eat out, water intake, caffeinated beverages, dairy products, fruits and vegetables):   Breakfast: Lunch:  Dinner:      Goals    None      Fall Risk Fall Risk  11/13/2016  Falls in the past year? No    Depression Screen PHQ 2/9 Scores 11/13/2016  PHQ - 2 Score 0    Cognitive Function        Immunization History  Administered Date(s) Administered  . Influenza Split 01/25/2011, 01/09/2012  . Influenza,inj,Quad PF,6+ Mos 01/09/2013, 12/31/2013  . Influenza-Unspecified 01/18/2015, 12/30/2015, 01/15/2017  . Pneumococcal Conjugate-13 07/29/2013  . Pneumococcal Polysaccharide-23 03/17/2009  . Td 06/14/2009  . Zoster Recombinat (Shingrix) 07/07/2016, 11/07/2016    Screening Tests Health Maintenance  Topic Date Due  . OPHTHALMOLOGY EXAM  10/13/2017  . INFLUENZA VACCINE  11/22/2017  . HEMOGLOBIN A1C  05/04/2018  . FOOT EXAM  11/02/2018  . URINE MICROALBUMIN  11/02/2018  . TETANUS/TDAP  06/15/2019  . PNA vac Low Risk Adult  Completed        Plan:     I have personally reviewed and noted the following in the patient's chart:   . Medical and social history . Use of alcohol, tobacco or illicit drugs  . Current medications and supplements . Functional ability and status . Nutritional status . Physical activity . Advanced directives . List of other physicians . Hospitalizations, surgeries, and ER visits in previous 12 months . Vitals . Screenings to include cognitive, depression, and falls . Referrals and appointments  In addition, I have reviewed and discussed with patient certain preventive protocols, quality  metrics, and best practice recommendations. A written personalized care plan for preventive services as well as general preventive health recommendations were provided to patient.     Gerilyn Nestle, RN  11/19/2017

## 2017-11-20 ENCOUNTER — Ambulatory Visit: Payer: Medicare Other

## 2017-11-22 ENCOUNTER — Encounter: Payer: Self-pay | Admitting: Family Medicine

## 2017-11-22 ENCOUNTER — Other Ambulatory Visit (INDEPENDENT_AMBULATORY_CARE_PROVIDER_SITE_OTHER): Payer: Medicare Other

## 2017-11-22 DIAGNOSIS — E875 Hyperkalemia: Secondary | ICD-10-CM | POA: Diagnosis not present

## 2017-11-22 LAB — BASIC METABOLIC PANEL
BUN: 50 mg/dL — AB (ref 6–23)
CALCIUM: 9.6 mg/dL (ref 8.4–10.5)
CO2: 25 mEq/L (ref 19–32)
Chloride: 102 mEq/L (ref 96–112)
Creatinine, Ser: 3.14 mg/dL — ABNORMAL HIGH (ref 0.40–1.50)
GFR: 19.95 mL/min — AB (ref >60.00)
GLUCOSE: 117 mg/dL — AB (ref 70–99)
Potassium: 4.5 mEq/L (ref 3.5–5.1)
Sodium: 137 mEq/L (ref 135–145)

## 2017-11-29 DIAGNOSIS — I129 Hypertensive chronic kidney disease with stage 1 through stage 4 chronic kidney disease, or unspecified chronic kidney disease: Secondary | ICD-10-CM | POA: Diagnosis not present

## 2017-11-29 DIAGNOSIS — N184 Chronic kidney disease, stage 4 (severe): Secondary | ICD-10-CM | POA: Diagnosis not present

## 2017-11-29 DIAGNOSIS — D631 Anemia in chronic kidney disease: Secondary | ICD-10-CM | POA: Diagnosis not present

## 2017-11-29 DIAGNOSIS — E875 Hyperkalemia: Secondary | ICD-10-CM | POA: Diagnosis not present

## 2018-01-07 ENCOUNTER — Ambulatory Visit (INDEPENDENT_AMBULATORY_CARE_PROVIDER_SITE_OTHER): Payer: Medicare Other

## 2018-01-07 DIAGNOSIS — Z23 Encounter for immunization: Secondary | ICD-10-CM | POA: Diagnosis not present

## 2018-01-11 ENCOUNTER — Other Ambulatory Visit (HOSPITAL_COMMUNITY): Payer: Self-pay

## 2018-01-11 MED ORDER — FUROSEMIDE 40 MG PO TABS: 40.0000 mg | ORAL_TABLET | Freq: Every day | ORAL | 2 refills | Status: DC

## 2018-01-31 ENCOUNTER — Ambulatory Visit: Payer: Medicare Other | Admitting: Family Medicine

## 2018-01-31 ENCOUNTER — Encounter: Payer: Self-pay | Admitting: Family Medicine

## 2018-01-31 NOTE — Progress Notes (Deleted)
OFFICE VISIT  01/31/2018   CC: No chief complaint on file.    HPI:    Patient is a 84 y.o. Caucasian male who presents for 3 mo f/u DM 2, HTN, and CRI stage III.  Also, pt attends [facility] CHF clinic, also has Type I 2nd degree AV block so BB/verp/dilt need to be avoided. ECHO 02/2017: LVEF/LV wall motion normal, mod tricusp regurg and mild pulm HTN-->repeat 07/2017 no change.  DM:  HLD:  CRI IV: since I last saw him he has had his initial eval at nephrologist's: ->    Past Medical History:  Diagnosis Date  . CAD (coronary artery disease)   . Carotid bruit    60-79% bilat In carot sten, stable over serial exams.  04/2015 stable 78-24% RICA, 23-53% LICA.  Repeat 1 yr.  . Cholelithiasis   . Chronic diastolic heart failure ([place])    CHF clinic  . Chronic renal insufficiency, stage 4 (severe) (HCC)    GFR 20-25 ml/min as of 10/2017.  11/05/17 renal u/s-->medical renal dz  . 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  Avoid BB/verap/dilt  . COPD (chronic obstructive pulmonary disease) ([place])   . Diabetes mellitus without complication ([place])    09/1441 HbA1c 6.8%  . Diverticulosis   . GERD (gastroesophageal reflux disease)   . Hyperlipidemia   . Hypertension   . Hypothyroidism   . Lumbago   . Osteoarthritis   . Right hip pain    Trochanteric bursitis: Dr. Alvan Dame injected this 09/2015.  Hip x-rays normal at Dr. Aurea Graff office.  . Venous insufficiency of both lower extremities     Past Surgical History:  Procedure Laterality Date  . APPENDECTOMY    . CORONARY ARTERY BYPASS GRAFT  2005   LIMA-LAD,SVG-PDA,SVG-D1,SVG-OM.  Myoview (9/08) showed EF63%,no scaror ischemia.  . TRANSTHORACIC ECHOCARDIOGRAM  01/29/2015; 03/08/17   2016 EF 55-60%, normal LV function and wall motion.  Mod tricusp regurg.  2018 EF 55-60%, normal wall motion, mod tricusp  regurg.  Pulmonary arteries: Systolic pressure was moderately increased (secondary to DD).    Outpatient Medications Prior to Visit  Medication Sig Dispense Refill  . aspirin 325 MG tablet Take 162.5 mg by mouth daily. TAKE 1/2 TAB     . atorvastatin (LIPITOR) 20 MG tablet Take 10 mg by mouth daily.    . Cholecalciferol (VITAMIN D3) 1000 UNITS tablet Take 1,000 Units by mouth daily.      . Coenzyme Q10 200 MG TABS Take 1 tablet by mouth daily.   0  . furosemide (LASIX) 40 MG tablet Take 1 tablet (40 mg total) by mouth daily. 30 tablet 2  . Garlic 1540 MG CAPS Take 1 capsule by mouth daily.    [name] Oil 350 MG CAPS Take 1 capsule by mouth daily.    Marland Kitchen levothyroxine (SYNTHROID, LEVOTHROID) 100 MCG tablet Take 100 mcg by mouth daily.      . mupirocin ointment (BACTROBAN) 2 % Apply 1 application topically 3 (three) times daily. 22 g 1  . omeprazole (PRILOSEC) 20 MG capsule Take 20 mg by mouth daily.      No facility-administered medications prior to visit.     Allergies  Allergen Reactions  . Amlodipine     edema  . Benazepril Hcl     REACTION: elev K    ROS As  per HPI  PE: There were no vitals taken for this visit. ***  LABS:  Lab Results  Component Value Date   TSH 0.886 08/04/2017   Lab Results  Component Value Date   WBC 10.2 11/01/2017   HGB 11.9 (L) 11/01/2017   HCT 37.1 (L) 11/01/2017   MCV 87.1 11/01/2017   PLT 145.0 (L) 11/01/2017   Lab Results  Component Value Date   CREATININE 3.14 (H) 11/22/2017   BUN 50 (H) 11/22/2017   NA 137 11/22/2017   K 4.5 11/22/2017   CL 102 11/22/2017   CO2 25 11/22/2017   Lab Results  Component Value Date   ALT 17 08/03/2017   AST 28 08/03/2017   ALKPHOS 82 08/03/2017   BILITOT 1.1 08/03/2017   Lab Results  Component Value Date   CHOL 104 01/24/2017   Lab Results  Component Value Date   HDL 41.70 01/24/2017   Lab Results  Component Value Date   LDLCALC 48 01/24/2017   Lab Results  Component Value Date    TRIG 75.0 01/24/2017   Lab Results  Component Value Date   CHOLHDL 3 01/24/2017   Lab Results  Component Value Date   PSA 0.76 12/31/2013   PSA 0.68 10/09/2012   PSA 0.49 09/30/2009   Lab Results  Component Value Date   HGBA1C 6.9 (H) 11/01/2017    IMPRESSION AND PLAN:  No problem-specific Assessment & Plan notes found for this encounter.   An After Visit Summary was printed and given to the patient.  FOLLOW UP: No follow-ups on file.  Signed:  Crissie Sickles, MD           01/31/2018

## 2018-02-14 ENCOUNTER — Ambulatory Visit: Payer: Medicare Other | Admitting: Family Medicine

## 2018-02-25 ENCOUNTER — Telehealth: Payer: Self-pay | Admitting: *Deleted

## 2018-02-25 ENCOUNTER — Encounter: Payer: Self-pay | Admitting: Family Medicine

## 2018-02-25 ENCOUNTER — Ambulatory Visit (INDEPENDENT_AMBULATORY_CARE_PROVIDER_SITE_OTHER): Payer: Medicare Other | Admitting: Family Medicine

## 2018-02-25 VITALS — BP 157/68 | HR 63 | Resp 16 | Ht 67.5 in | Wt 200.0 lb

## 2018-02-25 DIAGNOSIS — I10 Essential (primary) hypertension: Secondary | ICD-10-CM

## 2018-02-25 DIAGNOSIS — E119 Type 2 diabetes mellitus without complications: Secondary | ICD-10-CM | POA: Diagnosis not present

## 2018-02-25 DIAGNOSIS — I5032 Chronic diastolic (congestive) heart failure: Secondary | ICD-10-CM | POA: Diagnosis not present

## 2018-02-25 DIAGNOSIS — E7800 Pure hypercholesterolemia, unspecified: Secondary | ICD-10-CM

## 2018-02-25 DIAGNOSIS — N184 Chronic kidney disease, stage 4 (severe): Secondary | ICD-10-CM

## 2018-02-25 LAB — BASIC METABOLIC PANEL
BUN: 36 mg/dL — AB (ref 6–23)
CO2: 27 meq/L (ref 19–32)
Calcium: 9.7 mg/dL (ref 8.4–10.5)
Chloride: 102 mEq/L (ref 96–112)
Creatinine, Ser: 2.47 mg/dL — ABNORMAL HIGH (ref 0.40–1.50)
GFR: 26.3 mL/min — ABNORMAL LOW (ref >60.00)
GLUCOSE: 113 mg/dL — AB (ref 70–99)
POTASSIUM: 4.5 meq/L (ref 3.5–5.1)
SODIUM: 138 meq/L (ref 135–145)

## 2018-02-25 LAB — LIPID PANEL
CHOLESTEROL: 124 mg/dL (ref 0–200)
HDL: 50.7 mg/dL (ref >39.00)
LDL Cholesterol: 58 mg/dL (ref 0–99)
NonHDL: 73.11
TRIGLYCERIDES: 77 mg/dL (ref 0.0–149.0)
Total CHOL/HDL Ratio: 2
VLDL: 15.4 mg/dL (ref 0.0–40.0)

## 2018-02-25 LAB — HEMOGLOBIN A1C: HEMOGLOBIN A1C: 6.8 % — AB (ref 4.6–6.5)

## 2018-02-25 NOTE — Patient Instructions (Signed)
Call back and give the name and dose of the medication that you take 1/2 tab twice a day.

## 2018-02-25 NOTE — Progress Notes (Addendum)
OFFICE VISIT  02/25/2018   CC:  Chief Complaint  Patient presents with  . Follow-up    RCI   HPI:    Patient is a 84 y.o. Caucasian male who presents for f/u CRI stage 4, DM 2, HTN. He has chronic diastolic HF, followed by Dr. Aundra Dubin in [place] [facility] but he is overdue for f/u.  HTN: stopped home bp monitoring about 2 wks ago, but says systolic 676 on last check that he can recall. He cannot recall diastolic bp or HR.    He is staying busy, working in yard, he's going to [employer] a pig soon in his BBQ pit. No SOB with his usual activities.  DM: no home glucose checking, trying to eat diabetic diet.  No polyuria or polydipsia.  CRI: tries to hydrate well.  Avoids NSAIDs.  ROS: no CP, no SOB, no wheezing, no cough, no dizziness, no HAs, no rashes, no melena/hematochezia.  No polyuria or polydipsia.  No myalgias or arthralgias.  Past Medical History:  Diagnosis Date  . Anemia of chronic disease    +anemia of CRI  . CAD (coronary artery disease)   . Carotid bruit    60-79% bilat In carot sten, stable over serial exams.  04/2015 stable 72-09% RICA, 47-09% LICA.  Repeat 1 yr.  . Cholelithiasis   . Chronic diastolic heart failure ([place])    CHF clinic  . Chronic renal insufficiency, stage 4 (severe) (HCC)    GFR 20-25 ml/min as of 10/2017.  11/05/17 renal u/s-->medical renal dz.  Initial eval by neph 11/2017--nephrosclerosis.  . 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  Avoid BB/verap/dilt  . COPD (chronic obstructive pulmonary disease) ([place])   . Diabetes mellitus without complication ([place])    09/2834 HbA1c 6.8%  . Diverticulosis   . GERD (gastroesophageal reflux disease)   . Hyperlipidemia   . Hypertension   . Hypothyroidism   . Lumbago   . Osteoarthritis   . Right hip pain    Trochanteric bursitis: Dr. Alvan Dame injected this 09/2015.  Hip x-rays  normal at Dr. Aurea Graff office.  . Venous insufficiency of both lower extremities     Past Surgical History:  Procedure Laterality Date  . APPENDECTOMY    . CORONARY ARTERY BYPASS GRAFT  2005   LIMA-LAD,SVG-PDA,SVG-D1,SVG-OM.  Myoview (9/08) showed EF63%,no scaror ischemia.  . TRANSTHORACIC ECHOCARDIOGRAM  01/29/2015; 03/08/17   2016 EF 55-60%, normal LV function and wall motion.  Mod tricusp regurg.  2018 EF 55-60%, normal wall motion, mod tricusp regurg.  Pulmonary arteries: Systolic pressure was moderately increased (secondary to DD).    Outpatient Medications Prior to Visit  Medication Sig Dispense Refill  . aspirin 325 MG tablet Take 162.5 mg by mouth daily. TAKE 1/2 TAB     . atorvastatin (LIPITOR) 20 MG tablet Take 10 mg by mouth daily.    . Cholecalciferol (VITAMIN D3) 1000 UNITS tablet Take 1,000 Units by mouth daily.      . Coenzyme Q10 200 MG TABS Take 1 tablet by mouth daily.   0  . furosemide (LASIX) 40 MG tablet Take 1 tablet (40 mg total) by mouth daily. 30 tablet 2  . Garlic 6294 MG CAPS Take 1 capsule by mouth daily.    [name] Oil 350 MG CAPS Take 1 capsule by mouth daily.    Marland Kitchen levothyroxine (  SYNTHROID, LEVOTHROID) 100 MCG tablet Take 100 mcg by mouth daily.      . mupirocin ointment (BACTROBAN) 2 % Apply 1 application topically 3 (three) times daily. 22 g 1  . omeprazole (PRILOSEC) 20 MG capsule Take 20 mg by mouth daily.      No facility-administered medications prior to visit.     Allergies  Allergen Reactions  . Amlodipine     edema  . Benazepril Hcl     REACTION: elev K    ROS As per HPI  PE: Initial bp 179/88 Blood pressure (!) 157/68, pulse 63, resp. rate 16, height 5' 7.5" (1.715 m), weight 200 lb (90.7 kg), SpO2 97 %. Gen: Alert, well appearing.  Patient is oriented to person, place, time, and situation. AFFECT: pleasant, lucid thought and speech. ZSM:OLMB: no injection, icteris, swelling, or exudate.  EOMI, PERRLA. Mouth: lips without  lesion/swelling.  Oral mucosa pink and moist. Oropharynx without erythema, exudate, or swelling.  CV: RRR, occ ectopy.  No m/r. Chest is clear, no wheezing or rales. Normal symmetric air entry throughout both lung fields. No chest wall deformities or tenderness. ABD: soft, ND/NT. EXT: no clubbing or cyanosis.  He has 3+ R LL pitting edema and 2+ L LL pitting edema.  LABS:  Lab Results  Component Value Date   TSH 0.886 08/04/2017   Lab Results  Component Value Date   WBC 10.2 11/01/2017   HGB 11.9 (L) 11/01/2017   HCT 37.1 (L) 11/01/2017   MCV 87.1 11/01/2017   PLT 145.0 (L) 11/01/2017   Lab Results  Component Value Date   CREATININE 3.14 (H) 11/22/2017   BUN 50 (H) 11/22/2017   NA 137 11/22/2017   K 4.5 11/22/2017   CL 102 11/22/2017   CO2 25 11/22/2017   Lab Results  Component Value Date   ALT 17 08/03/2017   AST 28 08/03/2017   ALKPHOS 82 08/03/2017   BILITOT 1.1 08/03/2017   Lab Results  Component Value Date   CHOL 104 01/24/2017   Lab Results  Component Value Date   HDL 41.70 01/24/2017   Lab Results  Component Value Date   LDLCALC 48 01/24/2017   Lab Results  Component Value Date   TRIG 75.0 01/24/2017   Lab Results  Component Value Date   CHOLHDL 3 01/24/2017   Lab Results  Component Value Date   PSA 0.76 12/31/2013   PSA 0.68 10/09/2012   PSA 0.49 09/30/2009   Lab Results  Component Value Date   HGBA1C 6.9 (H) 11/01/2017    IMPRESSION AND PLAN:  1) DM 2: good control with diet. HbA1c and fasting glucose today.  2) HTN: elevated here, control sounds close to normal at home. There is a tab that he tells me he takes "1/2 tab twice a day" that I don't have on his med list. He'll call and tell me what this is before I make any bp med decisions.  3) CRI 4: saw nephrologist-->nephrosclerosis.  Avoid nephrotoxic agents, maintain adequate hydration, monitor sCr/lytes regularly. BMET today.  4) Chronic diastolic HF: aside from his chronic LL  pitting edema, no sign of volume overload today that would make me adjust his diuretic.   Lytes/cr today.  An After Visit Summary was printed and given to the patient.  FOLLOW UP: Return in about 3 months (around 05/28/2018) for annual CPE (fasting).  Signed:  Crissie Sickles, MD           02/25/2018  ADDENDUM 02/25/18 at  5 PM: Patient wife called to say that he is taking metoprolol tartrate 25 mg 1/2 tab by mouth twice a day instead of the  Toprol XL [facility] Will continue this med at current dose and add to his med list. Signed:  Crissie Sickles, MD           02/25/2018

## 2018-02-25 NOTE — Telephone Encounter (Signed)
Copied from [place] [id number]. Topic: General - Other >> Feb 25, 2018  1:05 PM Leward Quan A wrote: Reason for CRM: Patient wife called to say that he is taking metoprolol tartrate 25 mg 1/2 tab by mouth twice a day instead of the  Toprol XL said [facility] Ph# [phone]

## 2018-02-25 NOTE — Telephone Encounter (Signed)
FYI

## 2018-03-12 DIAGNOSIS — I70203 Unspecified atherosclerosis of native arteries of extremities, bilateral legs: Secondary | ICD-10-CM | POA: Diagnosis not present

## 2018-03-12 DIAGNOSIS — L84 Corns and callosities: Secondary | ICD-10-CM | POA: Diagnosis not present

## 2018-03-12 DIAGNOSIS — M79676 Pain in unspecified toe(s): Secondary | ICD-10-CM | POA: Diagnosis not present

## 2018-03-12 DIAGNOSIS — B351 Tinea unguium: Secondary | ICD-10-CM | POA: Diagnosis not present

## 2018-04-12 ENCOUNTER — Encounter (INDEPENDENT_AMBULATORY_CARE_PROVIDER_SITE_OTHER): Payer: Self-pay

## 2018-04-12 ENCOUNTER — Ambulatory Visit (HOSPITAL_COMMUNITY)
Admission: RE | Admit: 2018-04-12 | Discharge: 2018-04-12 | Disposition: A | Discharge status: Home / Self Care | Payer: Medicare Other | Source: Ambulatory Visit | Attending: Internal Medicine | Admitting: Internal Medicine

## 2018-04-12 DIAGNOSIS — I6529 Occlusion and stenosis of unspecified carotid artery: Secondary | ICD-10-CM | POA: Insufficient documentation

## 2018-04-18 ENCOUNTER — Telehealth (HOSPITAL_COMMUNITY): Payer: Self-pay

## 2018-04-18 DIAGNOSIS — I6529 Occlusion and stenosis of unspecified carotid artery: Secondary | ICD-10-CM

## 2018-04-18 NOTE — Telephone Encounter (Signed)
Pt aware of results. Pt will f/u with same study in 1 year.  Pt appreciative of call. Order placed for carotid duplex.

## 2018-04-18 NOTE — Telephone Encounter (Signed)
-----   Message from Larey Dresser, MD sent at 04/13/2018  6:54 AM EST ----- 40-59% RICA stenosis, repeat study 1 year.

## 2018-04-22 ENCOUNTER — Telehealth: Payer: Self-pay | Admitting: Family Medicine

## 2018-04-22 NOTE — Telephone Encounter (Signed)
Patient's wife came in asking about a card that the patient received from [facility]. It was a 6 month follow up appointment. Patient's wife is going to cancel that one and schedule it at the [place] clinic in [place]. No call back was requested.

## 2018-04-23 ENCOUNTER — Telehealth (HOSPITAL_COMMUNITY): Payer: Self-pay

## 2018-04-23 NOTE — Telephone Encounter (Signed)
Pt called with results verbalized understanding

## 2018-04-24 DIAGNOSIS — I441 Atrioventricular block, second degree: Secondary | ICD-10-CM

## 2018-04-24 HISTORY — DX: Atrioventricular block, second degree: I44.1

## 2018-05-15 ENCOUNTER — Ambulatory Visit (HOSPITAL_COMMUNITY)
Admission: RE | Admit: 2018-05-15 | Discharge: 2018-05-15 | Disposition: A | Discharge status: Home / Self Care | Payer: Medicare Other | Source: Ambulatory Visit | Attending: Cardiology | Admitting: Cardiology

## 2018-05-15 ENCOUNTER — Encounter (HOSPITAL_COMMUNITY): Payer: Self-pay | Admitting: Cardiology

## 2018-05-15 ENCOUNTER — Encounter (HOSPITAL_COMMUNITY): Payer: Self-pay

## 2018-05-15 ENCOUNTER — Other Ambulatory Visit (HOSPITAL_COMMUNITY): Payer: Self-pay

## 2018-05-15 VITALS — BP 140/80 | HR 79 | Wt 206.8 lb

## 2018-05-15 DIAGNOSIS — I350 Nonrheumatic aortic (valve) stenosis: Secondary | ICD-10-CM | POA: Diagnosis not present

## 2018-05-15 DIAGNOSIS — I5032 Chronic diastolic (congestive) heart failure: Secondary | ICD-10-CM

## 2018-05-15 DIAGNOSIS — Z7901 Long term (current) use of anticoagulants: Secondary | ICD-10-CM | POA: Diagnosis not present

## 2018-05-15 DIAGNOSIS — E039 Hypothyroidism, unspecified: Secondary | ICD-10-CM | POA: Diagnosis not present

## 2018-05-15 DIAGNOSIS — R9431 Abnormal electrocardiogram [ECG] [EKG]: Secondary | ICD-10-CM | POA: Insufficient documentation

## 2018-05-15 DIAGNOSIS — I4892 Unspecified atrial flutter: Secondary | ICD-10-CM

## 2018-05-15 DIAGNOSIS — Z79890 Hormone replacement therapy (postmenopausal): Secondary | ICD-10-CM | POA: Diagnosis not present

## 2018-05-15 DIAGNOSIS — Z8249 Family history of ischemic heart disease and other diseases of the circulatory system: Secondary | ICD-10-CM | POA: Diagnosis not present

## 2018-05-15 DIAGNOSIS — Z87891 Personal history of nicotine dependence: Secondary | ICD-10-CM | POA: Diagnosis not present

## 2018-05-15 DIAGNOSIS — I6529 Occlusion and stenosis of unspecified carotid artery: Secondary | ICD-10-CM | POA: Insufficient documentation

## 2018-05-15 DIAGNOSIS — I441 Atrioventricular block, second degree: Secondary | ICD-10-CM | POA: Diagnosis not present

## 2018-05-15 DIAGNOSIS — K219 Gastro-esophageal reflux disease without esophagitis: Secondary | ICD-10-CM | POA: Diagnosis not present

## 2018-05-15 DIAGNOSIS — Z8349 Family history of other endocrine, nutritional and metabolic diseases: Secondary | ICD-10-CM | POA: Diagnosis not present

## 2018-05-15 DIAGNOSIS — Z951 Presence of aortocoronary bypass graft: Secondary | ICD-10-CM | POA: Insufficient documentation

## 2018-05-15 DIAGNOSIS — I4589 Other specified conduction disorders: Secondary | ICD-10-CM

## 2018-05-15 DIAGNOSIS — N184 Chronic kidney disease, stage 4 (severe): Secondary | ICD-10-CM | POA: Diagnosis not present

## 2018-05-15 DIAGNOSIS — I4891 Unspecified atrial fibrillation: Secondary | ICD-10-CM | POA: Insufficient documentation

## 2018-05-15 DIAGNOSIS — Z79899 Other long term (current) drug therapy: Secondary | ICD-10-CM | POA: Diagnosis not present

## 2018-05-15 DIAGNOSIS — I2510 Atherosclerotic heart disease of native coronary artery without angina pectoris: Secondary | ICD-10-CM | POA: Diagnosis not present

## 2018-05-15 DIAGNOSIS — J449 Chronic obstructive pulmonary disease, unspecified: Secondary | ICD-10-CM | POA: Insufficient documentation

## 2018-05-15 DIAGNOSIS — I480 Paroxysmal atrial fibrillation: Secondary | ICD-10-CM

## 2018-05-15 DIAGNOSIS — I130 Hypertensive heart and chronic kidney disease with heart failure and stage 1 through stage 4 chronic kidney disease, or unspecified chronic kidney disease: Secondary | ICD-10-CM | POA: Diagnosis not present

## 2018-05-15 DIAGNOSIS — E785 Hyperlipidemia, unspecified: Secondary | ICD-10-CM | POA: Diagnosis not present

## 2018-05-15 DIAGNOSIS — I484 Atypical atrial flutter: Secondary | ICD-10-CM | POA: Insufficient documentation

## 2018-05-15 LAB — BASIC METABOLIC PANEL
ANION GAP: 10 (ref 5–15)
BUN: 33 mg/dL — ABNORMAL HIGH (ref 8–23)
CO2: 26 mmol/L (ref 22–32)
Calcium: 9.3 mg/dL (ref 8.9–10.3)
Chloride: 103 mmol/L (ref 98–111)
Creatinine, Ser: 2.9 mg/dL — ABNORMAL HIGH (ref 0.61–1.24)
GFR, EST AFRICAN AMERICAN: 21 mL/min — AB (ref >60)
GFR, EST NON AFRICAN AMERICAN: 18 mL/min — AB (ref >60)
Glucose, Bld: 108 mg/dL — ABNORMAL HIGH (ref 70–99)
POTASSIUM: 4.1 mmol/L (ref 3.5–5.1)
Sodium: 139 mmol/L (ref 135–145)

## 2018-05-15 MED ORDER — FUROSEMIDE 40 MG PO TABS: 60.0000 mg | ORAL_TABLET | Freq: Every day | ORAL | 2 refills | Status: DC

## 2018-05-15 MED ORDER — APIXABAN 5 MG PO TABS: 5.0000 mg | ORAL_TABLET | Freq: Two times a day (BID) | ORAL | 0 refills | Status: DC

## 2018-05-15 NOTE — Progress Notes (Signed)
Cardioversion scheduled for 14 Jun 2018 @ 9:00am

## 2018-05-15 NOTE — Patient Instructions (Addendum)
EKG completed today.  Labs were done today. We will call you with any ABNORMAL results. No news is good news!  Follow up lab work will need to be done in 10 days.  INCREASE LASIX to 60mg  (1.5 tabs) each day.  STOP TAKING ASPIRIN.  BEGIN taking Eliquis, 5mg  (1 tab) every day for the next 4 weeks to prepare for your cardioversion.   You will need an EKG the day before your procedure, this has been scheduled for 13 June 2018 @ 9:00.  Dr Aundra Dubin recommends a CARDIOVERSION for you, this has been scheduled for 14 June 2018 @ 9:00am, you will need to be here 2 HOURS PRIOR.   Your physician recommends that you schedule a follow-up appointment in 4 months.

## 2018-05-15 NOTE — Progress Notes (Signed)
Patient ID: Allen Holloway, male   DOB: 29-Dec-1926, 85 y.o.   MRN: [id number] PCP: Dr. Anitra Lauth Cardiology: Dr. Aundra Dubin  85 y.o. with history of CAD s/p CABG in 2005 with nonischemic myoview in 2008 and CKD stage III-IV presents for followup of CAD and chronic diastolic CHF. Echo in 11/18 showed EF 55-60%, moderately dilated RV with normal systolic function, PASP 56 mmHg.  He has had conduction abnormalities noted in the past and was taken off metoprolol.  Echo in 4/19 showed EF 55-60%, moderate RV dilation with normal systolic function.   Today, he returns for followup of diastolic CHF and CAD. Weight is up about 5 lbs.  He continues to have lower leg edema. He denies exertional dyspnea though he is not very active due to poor balance and chronic hip pain.  No orthopnea/PND. No chest pain. He is noted to be in atrial fibrillation today but he does not feel it.  ECG (personally reviewed): Atypical atrial flutter rate 59  Labs (6/11): K 5.3, creatinine 1.6, LDL 70, HDL 58, HCT 37, TSH normal  Labs (11/12): K 4.5, creatinine 1.9, LDL 77, HDL 60 Labs (5/14): BNP 296 Labs (9/14): K 4.5, creatinine 1.8 Labs (10/14): K 4.6, creatinine 1.8, BNP 203 Labs (11/14): LDL 86, HDL 43, K 4.8, creatinine 2.0 Labs (1/16): K 5, creatinine 2.0, LDL 143, HDL 41.5 Labs (1/17): LDL 39, HDL 47, K 5, creatinine 2.0 Labs (10/18): LDL 48, HDL 42, K 5.1, creatinine 2.38 Labs (1/19): creatinine 3.34 Labs (11/19): K 4.5, creatinine 2.47, LDL 58, HDL 51  Allergies (verified):  1) Lotensin (Benazepril Hcl)   Past Medical History:  1. COPD  2. Coronary artery disease: CABG 2005 with LIMA-LAD, SVG-PDA, SVG-D1, SVG-OM. Myoview (9/08) showed EF 63%, no scar or ischemia.  Echo (8/11): EF 55%, mild AI, mild MR, moderate TR, mildly dilated RV, PA systolic pressure 34 mmHg.  Echo (6/14): EF 50-55%, mild LVH, mild AS, mild MR, mildly dilated RV.  3. Hyperlipidemia: Myalgias Lipitor and Crestor, 40 mg Lipitor.  4. Hypertension   5. Carotid bruit: Carotid dopplers (8/12) with 35-46% RICA, 56-81% LICA.  Carotid dopplers (4/14) with 60-79% bilateral ICA stenosis.  Carotid dopplers (10/14) with 60-79% bilateral ICA stenosis. Carotid dopplers (1/16) with 60-79% BICA stenosis.  - Carotid doppler (1/17): 27-51% RICA, 70-01% LICA.  - Carotid doppler (12/18): 40-59% RICA, right subclavian stenosis.  - Carotid dopplers (12/19): 40-59% BICA stenosis.  6. Cholelithiasis  7. CKD stage IV 8. Low back pain  9. Osteoarthritis  10. GERD  11. Hypothyroidism  12. ABIs normal 8/11, 3/15, 12/18.  13. 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  14. Chronic diastolic CHF - Echo (74/94): EF 55-60%, mild MR, moderate TR.  - Echo (11/18): EF 55-60%, no significant aortic stenosis, moderately dilated RV with normal systolic function, PASP 56 mmHg.  - Echo (4/19): EF 55-60%, moderate RV dilation with normal systolic function, PASP 49 mmHg.  15. Aortic stenosis: Mild on 6/14 echo, minimal on 10/16 echo.  16. Atrial flutter: Atypical.  Noted 1/20.   Family History:  Family History High cholesterol  Family History Hypertension   Social History:  Retired  Married  Former Smoker   ROS: All systems reviewed and negative except as per HPI.   Current Outpatient Medications  Medication Sig Dispense Refill  . atorvastatin (LIPITOR) 20 MG tablet Take 10 mg by mouth daily.    . Cholecalciferol (VITAMIN D3) 1000 UNITS tablet  Take 1,000 Units by mouth daily.      . Coenzyme Q10 200 MG TABS Take 1 tablet by mouth daily.   0  . furosemide (LASIX) 40 MG tablet Take 1.5 tablets (60 mg total) by mouth daily. 30 tablet 2  . Garlic 6767 MG CAPS Take 1 capsule by mouth daily.    [name] Oil 350 MG CAPS Take 1 capsule by mouth daily.    Marland Kitchen levothyroxine (SYNTHROID, LEVOTHROID) 100 MCG tablet Take 100 mcg by mouth daily.      . metoprolol tartrate (LOPRESSOR) 25 MG tablet Take 12.5 mg by mouth 2 (two)  times daily.    . mupirocin ointment (BACTROBAN) 2 % Apply 1 application topically 3 (three) times daily. 22 g 1  . omeprazole (PRILOSEC) 20 MG capsule Take 20 mg by mouth daily.     Marland Kitchen apixaban (ELIQUIS) 5 MG TABS tablet Take 1 tablet (5 mg total) by mouth 2 (two) times daily. 60 tablet 0   No current facility-administered medications for this encounter.     BP 140/80   Pulse 79   Wt 93.8 kg (206 lb 12.8 oz)   SpO2 94%   BMI 31.91 kg/m  General: NAD Neck: JVP 8-9 cm, no thyromegaly or thyroid nodule.  Lungs: Clear to auscultation bilaterally with normal respiratory effort. CV: Nondisplaced PMI.  Heart irregular S1/S2, no S3/S4, no murmur.  1+ ankle edema.  No carotid bruit.  Normal pedal pulses.  Abdomen: Soft, nontender, no hepatosplenomegaly, no distention.  Skin: Intact without lesions or rashes.  Neurologic: Alert and oriented x 3.  Psych: Normal affect. Extremities: No clubbing or cyanosis.  HEENT: Normal.   Assessment/Plan: 1. CAD: Stable without chest pain.   - Continue statin.  - Will be stopping ASA to start Eliquis.  2. Chronic diastolic CHF:  He has at least mild volume overload on exam.  This is worsened by CKD stage IV and probably also by atrial flutter.    - Increase Lasix to 60 mg daily.  BMET today and in 10 days. 3. Hyperlipidemia: Myalgias with Crestor, pravastatin, and atorvastatin 40 daily.  He is able to tolerate atorvastatin 20 daily.  Excellent lipids in 11/19.    4. Carotid stenosis: Repeat carotid dopplers in 12/20.  5. 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  No symptoms of bradycardia.  He is off beta blockers.   6. CKD: Stage IV.  BMET today.  7. Atrial flutter: Atypical atrial flutter noted on ECG today.  This may contribute to worsening CHF.  - Start Eliquis 5 mg bid and stop ASA.  - After 4 weeks on Eliquis, I will arrange for DCCV. We discussed risks/benefits and he  agrees to procedure.   Followup in 6 mos.  Loralie Champagne 05/15/2018

## 2018-05-15 NOTE — Progress Notes (Signed)
Samples given: Eliquis 5mg , 2 boxes. Lot #:[id number]. Expiration: jun 2022

## 2018-05-15 NOTE — Addendum Note (Signed)
Encounter addended by: Jovita Kussmaul, RN on: 05/15/2018 3:47 PM  Actions taken: Clinical Note Signed

## 2018-05-15 NOTE — Addendum Note (Signed)
Encounter addended by: Larey Dresser, MD on: 05/15/2018 10:19 PM  Actions taken: Clinical Note Signed, Visit diagnoses modified, LOS modified

## 2018-05-16 ENCOUNTER — Telehealth (HOSPITAL_COMMUNITY): Payer: Self-pay

## 2018-05-16 NOTE — Telephone Encounter (Signed)
PA initiated for Eliquis via CMM.

## 2018-05-20 ENCOUNTER — Encounter: Payer: Self-pay | Admitting: Family Medicine

## 2018-05-23 ENCOUNTER — Telehealth (HOSPITAL_COMMUNITY): Payer: Self-pay

## 2018-05-23 ENCOUNTER — Ambulatory Visit (HOSPITAL_COMMUNITY)
Admission: RE | Admit: 2018-05-23 | Discharge: 2018-05-23 | Disposition: A | Discharge status: Home / Self Care | Payer: Medicare Other | Source: Ambulatory Visit | Attending: Cardiology | Admitting: Cardiology

## 2018-05-23 DIAGNOSIS — Z8349 Family history of other endocrine, nutritional and metabolic diseases: Secondary | ICD-10-CM | POA: Insufficient documentation

## 2018-05-23 DIAGNOSIS — Z79890 Hormone replacement therapy (postmenopausal): Secondary | ICD-10-CM | POA: Insufficient documentation

## 2018-05-23 DIAGNOSIS — Z885 Allergy status to narcotic agent status: Secondary | ICD-10-CM | POA: Diagnosis not present

## 2018-05-23 DIAGNOSIS — D631 Anemia in chronic kidney disease: Secondary | ICD-10-CM | POA: Diagnosis not present

## 2018-05-23 DIAGNOSIS — E119 Type 2 diabetes mellitus without complications: Secondary | ICD-10-CM | POA: Diagnosis present

## 2018-05-23 DIAGNOSIS — K219 Gastro-esophageal reflux disease without esophagitis: Secondary | ICD-10-CM | POA: Insufficient documentation

## 2018-05-23 DIAGNOSIS — E1122 Type 2 diabetes mellitus with diabetic chronic kidney disease: Secondary | ICD-10-CM | POA: Insufficient documentation

## 2018-05-23 DIAGNOSIS — Z888 Allergy status to other drugs, medicaments and biological substances status: Secondary | ICD-10-CM | POA: Insufficient documentation

## 2018-05-23 DIAGNOSIS — I4892 Unspecified atrial flutter: Secondary | ICD-10-CM | POA: Insufficient documentation

## 2018-05-23 DIAGNOSIS — J449 Chronic obstructive pulmonary disease, unspecified: Secondary | ICD-10-CM | POA: Diagnosis not present

## 2018-05-23 DIAGNOSIS — I5032 Chronic diastolic (congestive) heart failure: Secondary | ICD-10-CM | POA: Insufficient documentation

## 2018-05-23 DIAGNOSIS — E039 Hypothyroidism, unspecified: Secondary | ICD-10-CM | POA: Diagnosis not present

## 2018-05-23 DIAGNOSIS — E785 Hyperlipidemia, unspecified: Secondary | ICD-10-CM | POA: Insufficient documentation

## 2018-05-23 DIAGNOSIS — I130 Hypertensive heart and chronic kidney disease with heart failure and stage 1 through stage 4 chronic kidney disease, or unspecified chronic kidney disease: Secondary | ICD-10-CM | POA: Insufficient documentation

## 2018-05-23 DIAGNOSIS — Z7901 Long term (current) use of anticoagulants: Secondary | ICD-10-CM | POA: Insufficient documentation

## 2018-05-23 DIAGNOSIS — Z0000 Encounter for general adult medical examination without abnormal findings: Secondary | ICD-10-CM | POA: Diagnosis not present

## 2018-05-23 DIAGNOSIS — Z79899 Other long term (current) drug therapy: Secondary | ICD-10-CM | POA: Insufficient documentation

## 2018-05-23 DIAGNOSIS — Z8249 Family history of ischemic heart disease and other diseases of the circulatory system: Secondary | ICD-10-CM | POA: Insufficient documentation

## 2018-05-23 DIAGNOSIS — Z951 Presence of aortocoronary bypass graft: Secondary | ICD-10-CM | POA: Diagnosis not present

## 2018-05-23 DIAGNOSIS — Z87891 Personal history of nicotine dependence: Secondary | ICD-10-CM | POA: Insufficient documentation

## 2018-05-23 DIAGNOSIS — Z6831 Body mass index (BMI) 31.0-31.9, adult: Secondary | ICD-10-CM | POA: Diagnosis not present

## 2018-05-23 DIAGNOSIS — E669 Obesity, unspecified: Secondary | ICD-10-CM | POA: Insufficient documentation

## 2018-05-23 DIAGNOSIS — N184 Chronic kidney disease, stage 4 (severe): Secondary | ICD-10-CM | POA: Insufficient documentation

## 2018-05-23 LAB — BASIC METABOLIC PANEL
Anion gap: 10 (ref 5–15)
BUN: 43 mg/dL — ABNORMAL HIGH (ref 8–23)
CO2: 28 mmol/L (ref 22–32)
Calcium: 9.7 mg/dL (ref 8.9–10.3)
Chloride: 102 mmol/L (ref 98–111)
Creatinine, Ser: 2.99 mg/dL — ABNORMAL HIGH (ref 0.61–1.24)
GFR calc Af Amer: 20 mL/min — ABNORMAL LOW (ref >60)
GFR calc non Af Amer: 18 mL/min — ABNORMAL LOW (ref >60)
Glucose, Bld: 118 mg/dL — ABNORMAL HIGH (ref 70–99)
Potassium: 4.4 mmol/L (ref 3.5–5.1)
SODIUM: 140 mmol/L (ref 135–145)

## 2018-05-23 NOTE — Telephone Encounter (Signed)
Prior authorization through [employer] insurance company was APPROVED for eliquis and will expire on 05/16/2019.

## 2018-05-24 ENCOUNTER — Encounter: Payer: Self-pay | Admitting: *Deleted

## 2018-05-27 ENCOUNTER — Ambulatory Visit (INDEPENDENT_AMBULATORY_CARE_PROVIDER_SITE_OTHER): Payer: Medicare Other | Admitting: Family Medicine

## 2018-05-27 ENCOUNTER — Encounter: Payer: Self-pay | Admitting: Family Medicine

## 2018-05-27 VITALS — BP 145/74 | HR 64 | Temp 97.7°F | Resp 16 | Ht 67.5 in | Wt 201.2 lb

## 2018-05-27 DIAGNOSIS — E039 Hypothyroidism, unspecified: Secondary | ICD-10-CM | POA: Diagnosis not present

## 2018-05-27 DIAGNOSIS — E785 Hyperlipidemia, unspecified: Secondary | ICD-10-CM

## 2018-05-27 DIAGNOSIS — N184 Chronic kidney disease, stage 4 (severe): Secondary | ICD-10-CM | POA: Diagnosis not present

## 2018-05-27 DIAGNOSIS — I10 Essential (primary) hypertension: Secondary | ICD-10-CM

## 2018-05-27 DIAGNOSIS — E119 Type 2 diabetes mellitus without complications: Secondary | ICD-10-CM

## 2018-05-27 DIAGNOSIS — Z0000 Encounter for general adult medical examination without abnormal findings: Secondary | ICD-10-CM

## 2018-05-27 DIAGNOSIS — I5032 Chronic diastolic (congestive) heart failure: Secondary | ICD-10-CM

## 2018-05-27 DIAGNOSIS — E669 Obesity, unspecified: Secondary | ICD-10-CM

## 2018-05-27 LAB — CBC WITH DIFFERENTIAL/PLATELET
BASOS PCT: 0.3 % (ref 0.0–3.0)
Basophils Absolute: 0 10*3/uL (ref 0.0–0.1)
Eosinophils Absolute: 0.1 10*3/uL (ref 0.0–0.7)
Eosinophils Relative: 0.9 % (ref 0.0–5.0)
HCT: 38.6 % — ABNORMAL LOW (ref 39.0–52.0)
Hemoglobin: 12.5 g/dL — ABNORMAL LOW (ref 13.0–17.0)
Lymphocytes Relative: 41.8 % (ref 12.0–46.0)
Lymphs Abs: 3.9 10*3/uL (ref 0.7–4.0)
MCHC: 32.5 g/dL (ref 30.0–36.0)
MCV: 90.2 fl (ref 78.0–100.0)
Monocytes Absolute: 0.7 10*3/uL (ref 0.1–1.0)
Monocytes Relative: 7 % (ref 3.0–12.0)
NEUTROS ABS: 4.7 10*3/uL (ref 1.4–7.7)
Neutrophils Relative %: 50 % (ref 43.0–77.0)
PLATELETS: 204 10*3/uL (ref 150.0–400.0)
RBC: 4.28 Mil/uL (ref 4.22–5.81)
RDW: 15.5 % (ref 11.5–15.5)
WBC: 9.4 10*3/uL (ref 4.0–10.5)

## 2018-05-27 LAB — TSH: TSH: 7.47 u[IU]/mL — ABNORMAL HIGH (ref 0.35–4.50)

## 2018-05-27 LAB — HEMOGLOBIN A1C: Hgb A1c MFr Bld: 6.6 % — ABNORMAL HIGH (ref 4.6–6.5)

## 2018-05-27 NOTE — Patient Instructions (Signed)

## 2018-05-27 NOTE — Progress Notes (Signed)
Office Note 05/27/2018  CC:  Chief Complaint  Patient presents with  . Annual Exam    Pt is fasting.     HPI:  Allen Holloway is a 85 y.o. White male who is here accompanied by his wife for annual health maintenance exam. I last saw him 02/25/2018. Since that time, he followed up with CHF clinic on 05/15/18, was found to have worsening HF.  Was found to be in atypical a flutter.  He was changed to eliquis, with plan to DC CV in 1 mo. His lasix was also increased to 60mg  qd.  His GFR at that time was 20 ml/min, and f/u BMET 4 d/a showed this and his potassium to be stable.  No changes were made by cardiology after most recent labs.  He feels like his LE swelling has gone down a lot.  He is able to walk fine, piddles around house and yard. Avoids Na in diet.    DM: no home glucose monitoring.  Not eating a great diabetic diet.   Past Medical History:  Diagnosis Date  . Anemia of chronic disease    +anemia of CRI  . Atrial flutter ([place])    05/15/18-->started on eliquis and plan is for DCCV after he has been on this for 1 month  . CAD (coronary artery disease)   . Carotid bruit    60-79% bilat In carot sten, stable over serial exams.  04/2015 stable 29-51% RICA, 88-41% LICA.  Repeat 1 yr.  . Cholelithiasis   . Chronic diastolic heart failure ([place])    CHF clinic  . Chronic renal insufficiency, stage 4 (severe) (HCC)    GFR 20-25 ml/min as of 10/2017.  11/05/17 renal u/s-->medical renal dz.  Initial eval by neph 11/2017--nephrosclerosis.  . 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  Avoid BB/verap/dilt  . COPD (chronic obstructive pulmonary disease) ([place])   . Diabetes mellitus without complication ([place])    09/6061 HbA1c 6.8%  . Diverticulosis   . GERD (gastroesophageal reflux disease)   . Hyperlipidemia   . Hypertension   . Hypothyroidism    . Lumbago   . Osteoarthritis   . Right hip pain    Trochanteric bursitis: Dr. Alvan Dame injected this 09/2015.  Hip x-rays normal at Dr. Aurea Graff office.  . Venous insufficiency of both lower extremities     Past Surgical History:  Procedure Laterality Date  . APPENDECTOMY    . CORONARY ARTERY BYPASS GRAFT  2005   LIMA-LAD,SVG-PDA,SVG-D1,SVG-OM.  Myoview (9/08) showed EF63%,no scaror ischemia.  . TRANSTHORACIC ECHOCARDIOGRAM  01/29/2015; 03/08/17   2016 EF 55-60%, normal LV function and wall motion.  Mod tricusp regurg.  2018 EF 55-60%, normal wall motion, mod tricusp regurg.  Pulmonary arteries: Systolic pressure was moderately increased (secondary to DD).    Family History  Problem Relation Age of Onset  . Hyperlipidemia Unknown   . Hypertension Unknown   . Colon cancer Neg Hx     Social History   Socioeconomic History  . Marital status: Married    Spouse name: Not on file  . Number of children: Not on file  . Years of education: Not on file  . Highest education level: Not on file  Occupational History  . Occupation: retired  Scientific laboratory technician  . Financial resource strain: Not on file  . Food insecurity:  Worry: Not on file    Inability: Not on file  . Transportation needs:    Medical: Not on file    Non-medical: Not on file  Tobacco Use  . Smoking status: Former Research scientist (life sciences)  . Smokeless tobacco: Never Used  . Tobacco comment: quit 20-79yrs ago.  Substance and Sexual Activity  . Alcohol use: No  . Drug use: No  . Sexual activity: Not on file  Lifestyle  . Physical activity:    Days per week: Not on file    Minutes per session: Not on file  . Stress: Not on file  Relationships  . Social connections:    Talks on phone: Not on file    Gets together: Not on file    Attends religious service: Not on file    Active member of club or organization: Not on file    Attends meetings of clubs or organizations: Not on file    Relationship status: Not on file  . Intimate partner  violence:    Fear of current or ex partner: Not on file    Emotionally abused: Not on file    Physically abused: Not on file    Forced sexual activity: Not on file  Other Topics Concern  . Not on file  Social History Narrative   Married, has 2 stepsons.   Occupation: retired truckdriver x 50 yrs.   Never smoked more than a few years when a teenager, otherwise is a nonsmoker.   No alcohol.   Gets wellness at [facility] q 1 yr.        Daily caffeine use--4 cups daily.    Outpatient Medications Prior to Visit  Medication Sig Dispense Refill  . apixaban (ELIQUIS) 5 MG TABS tablet Take 1 tablet (5 mg total) by mouth 2 (two) times daily. 60 tablet 0  . atorvastatin (LIPITOR) 20 MG tablet Take 10 mg by mouth daily.    . Cholecalciferol (VITAMIN D3) 1000 UNITS tablet Take 1,000 Units by mouth daily.      . Coenzyme Q10 200 MG TABS Take 1 tablet by mouth daily.   0  . furosemide (LASIX) 40 MG tablet Take 1.5 tablets (60 mg total) by mouth daily. 30 tablet 2  . Garlic 6144 MG CAPS Take 1 capsule by mouth daily.    [name] Oil 350 MG CAPS Take 1 capsule by mouth daily.    Marland Kitchen levothyroxine (SYNTHROID, LEVOTHROID) 100 MCG tablet Take 100 mcg by mouth daily.      . metoprolol tartrate (LOPRESSOR) 25 MG tablet Take 12.5 mg by mouth 2 (two) times daily.    Marland Kitchen omeprazole (PRILOSEC) 20 MG capsule Take 20 mg by mouth daily.     . mupirocin ointment (BACTROBAN) 2 % Apply 1 application topically 3 (three) times daily. (Patient not taking: Reported on 05/27/2018) 22 g 1   No facility-administered medications prior to visit.     Allergies  Allergen Reactions  . Amlodipine     edema  . Benazepril Hcl     REACTION: elev K    ROS Review of Systems  Constitutional: Negative for appetite change, chills, fatigue and fever.  HENT: Negative for congestion, dental problem, ear pain and sore throat.   Eyes: Negative for discharge, redness and visual disturbance.  Respiratory: Negative for cough, chest tightness,  shortness of breath and wheezing.   Cardiovascular: Negative for chest pain, palpitations and leg swelling.  Gastrointestinal: Negative for abdominal pain, blood in stool, diarrhea, nausea and vomiting.  Genitourinary: Negative for difficulty urinating, dysuria, flank pain, frequency, hematuria and urgency.  Musculoskeletal: Negative for arthralgias, back pain, joint swelling, myalgias and neck stiffness.  Skin: Negative for pallor and rash.  Neurological: Negative for dizziness, speech difficulty, weakness and headaches.  Hematological: Negative for adenopathy. Does not bruise/bleed easily.  Psychiatric/Behavioral: Negative for confusion and sleep disturbance. The patient is not nervous/anxious.     PE; Blood pressure (!) 145/74, pulse 64, temperature 97.7 F (36.5 C), temperature source Oral, resp. rate 16, height 5' 7.5" (1.715 m), weight 201 lb 4 oz (91.3 kg), SpO2 100 %. Body mass index is 31.05 kg/m.  Gen: Alert, well appearing.  Patient is oriented to person, place, time, and situation. AFFECT: pleasant, lucid thought and speech. ENT: Ears: EACs clear, normal epithelium.  TMs with good light reflex and landmarks bilaterally.  Eyes: no injection, icteris, swelling, or exudate.  EOMI, PERRLA. Nose: no drainage or turbinate edema/swelling.  No injection or focal lesion.  Mouth: lips without lesion/swelling.  Oral mucosa pink and moist.  Dentition intact and without obvious caries or gingival swelling.  Oropharynx without erythema, exudate, or swelling.  Neck: supple/nontender.  No LAD, mass, or TM.  Carotid pulses 2+ bilaterally, without bruits. CV: RRR, no m/r/g.   LUNGS: CTA bilat, nonlabored resps, good aeration in all lung fields. ABD: soft, NT, ND, BS normal.  No hepatospenomegaly or mass.  No bruits. EXT: no clubbing or cyanosis.  He has 1+ pitting edema in both LL's.    Musculoskeletal: no joint swelling, erythema, warmth, or tenderness.  ROM of all joints intact. Skin - no sores  or suspicious lesions, color changes. He has pretibial pinkish discoloration diffusely with hyperkeratosis.  No ulceration or distention or weeping of LL's.   DP pulses 1+ R foot, 2+ L foot.   Pertinent labs:  Lab Results  Component Value Date   TSH 0.886 08/04/2017   Lab Results  Component Value Date   WBC 10.2 11/01/2017   HGB 11.9 (L) 11/01/2017   HCT 37.1 (L) 11/01/2017   MCV 87.1 11/01/2017   PLT 145.0 (L) 11/01/2017   Lab Results  Component Value Date   CREATININE 2.99 (H) 05/23/2018   BUN 43 (H) 05/23/2018   NA 140 05/23/2018   K 4.4 05/23/2018   CL 102 05/23/2018   CO2 28 05/23/2018   Lab Results  Component Value Date   ALT 17 08/03/2017   AST 28 08/03/2017   ALKPHOS 82 08/03/2017   BILITOT 1.1 08/03/2017   Lab Results  Component Value Date   CHOL 124 02/25/2018   Lab Results  Component Value Date   HDL 50.70 02/25/2018   Lab Results  Component Value Date   LDLCALC 58 02/25/2018   Lab Results  Component Value Date   TRIG 77.0 02/25/2018   Lab Results  Component Value Date   CHOLHDL 2 02/25/2018   Lab Results  Component Value Date   PSA 0.76 12/31/2013   PSA 0.68 10/09/2012   PSA 0.49 09/30/2009   Lab Results  Component Value Date   HGBA1C 6.8 (H) 02/25/2018    ASSESSMENT AND PLAN:   1)Health maintenance exam: Reviewed age and gender appropriate health maintenance issues (prudent diet, regular exercise, health risks of tobacco and excessive alcohol, use of seatbelts, fire alarms in home, use of sunscreen).  Also reviewed age and gender appropriate health screening as well as vaccine recommendations. Vaccines: all UTD. Labs: CBC, TSH, HbA1c (FLP excellent 02/2018, lytes/cr stable 4 d/a). Prostate  ca screening: n/a-->pt has aged-out. Colon ca screening: n/a-->pt has aged-out.  2) DM 2: historically well controlled.  HbA1c today. Eye exam UTD via [facility] in [place].  3) Chronic diastolic HF: recently found to have some fluid overload at CHF  clinic, atypical a flutter-->lasix increased to 60mg  qd and pt started on eliquis and taken off of his ASA. Plan for DCCV approx 1 mo from the date of starting eliquis.  4) hypothyroidism: recheck TSH today.  5) CRI IV: recent lytes normal, GFR stable around 20 ml/min. Avoids NSAIDs.  Hydrates adequately. GFR stable s/p recent increase in lasix to 60 mg qd.  An After Visit Summary was printed and given to the patient.  FOLLOW UP:  Return in about 3 months (around 08/25/2018).  Signed:  Crissie Sickles, MD           05/27/2018

## 2018-05-28 ENCOUNTER — Other Ambulatory Visit (INDEPENDENT_AMBULATORY_CARE_PROVIDER_SITE_OTHER): Payer: Medicare Other

## 2018-05-28 ENCOUNTER — Other Ambulatory Visit: Payer: Self-pay | Admitting: Family Medicine

## 2018-05-28 DIAGNOSIS — E039 Hypothyroidism, unspecified: Secondary | ICD-10-CM | POA: Diagnosis not present

## 2018-05-28 LAB — T4, FREE: Free T4: 0.98 ng/dL (ref 0.60–1.60)

## 2018-05-28 NOTE — Progress Notes (Signed)
fre

## 2018-06-11 DIAGNOSIS — B351 Tinea unguium: Secondary | ICD-10-CM | POA: Diagnosis not present

## 2018-06-11 DIAGNOSIS — L84 Corns and callosities: Secondary | ICD-10-CM | POA: Diagnosis not present

## 2018-06-11 DIAGNOSIS — M79676 Pain in unspecified toe(s): Secondary | ICD-10-CM | POA: Diagnosis not present

## 2018-06-11 DIAGNOSIS — I70203 Unspecified atherosclerosis of native arteries of extremities, bilateral legs: Secondary | ICD-10-CM | POA: Diagnosis not present

## 2018-06-13 ENCOUNTER — Ambulatory Visit (HOSPITAL_COMMUNITY)
Admission: RE | Admit: 2018-06-13 | Discharge: 2018-06-13 | Disposition: A | Discharge status: Home / Self Care | Payer: Medicare Other | Source: Ambulatory Visit | Attending: Cardiology | Admitting: Cardiology

## 2018-06-13 VITALS — BP 140/72 | HR 61 | Wt 206.2 lb

## 2018-06-13 DIAGNOSIS — I4891 Unspecified atrial fibrillation: Secondary | ICD-10-CM | POA: Diagnosis not present

## 2018-06-13 DIAGNOSIS — I5032 Chronic diastolic (congestive) heart failure: Secondary | ICD-10-CM | POA: Insufficient documentation

## 2018-06-13 NOTE — Patient Instructions (Signed)
EKG today.  NEEDS Cardioversion. It is scheduled for 06/18/2018 at 2:15pm. Arrive 2 hours early (12:15pm).

## 2018-06-13 NOTE — Progress Notes (Signed)
Pt came in for an EKG and to reschedule his DCCV. It has been rescheduled for 06/18/2018 @ 2:15pm.

## 2018-06-18 ENCOUNTER — Encounter (HOSPITAL_COMMUNITY): Payer: Self-pay | Admitting: Anesthesiology

## 2018-06-18 ENCOUNTER — Telehealth (HOSPITAL_COMMUNITY): Payer: Self-pay | Admitting: Cardiology

## 2018-06-18 ENCOUNTER — Encounter (HOSPITAL_COMMUNITY): Payer: Self-pay | Admitting: Cardiology

## 2018-06-18 ENCOUNTER — Ambulatory Visit (HOSPITAL_COMMUNITY)
Admission: RE | Admit: 2018-06-18 | Discharge: 2018-06-18 | Disposition: A | Discharge status: Home / Self Care | Payer: Medicare Other | Attending: Cardiology | Admitting: Cardiology

## 2018-06-18 ENCOUNTER — Encounter (HOSPITAL_COMMUNITY)
Admission: RE | Disposition: A | Discharge status: Home / Self Care | Payer: Self-pay | Source: Home / Self Care | Attending: Cardiology

## 2018-06-18 DIAGNOSIS — Z538 Procedure and treatment not carried out for other reasons: Secondary | ICD-10-CM | POA: Diagnosis not present

## 2018-06-18 DIAGNOSIS — I4892 Unspecified atrial flutter: Secondary | ICD-10-CM | POA: Insufficient documentation

## 2018-06-18 SURGERY — CANCELLED PROCEDURE

## 2018-06-18 MED ORDER — APIXABAN 5 MG PO TABS: 5.0000 mg | ORAL_TABLET | Freq: Two times a day (BID) | ORAL | 11 refills | Status: DC

## 2018-06-18 NOTE — Telephone Encounter (Signed)
Rescheduled outpatient dccv as patient HELD eliquis for procedure today-reschedule x 3 weeks per Holley Bouche

## 2018-06-18 NOTE — Telephone Encounter (Signed)
Error

## 2018-06-18 NOTE — Anesthesia Preprocedure Evaluation (Deleted)
Anesthesia Evaluation  Patient identified by MRN, date of birth, ID band Patient awake    Reviewed: Allergy & Precautions, Patient's Chart, lab work & pertinent test results  Airway        Dental   Pulmonary COPD, former smoker,           Cardiovascular hypertension, + CAD and +CHF  + dysrhythmias Atrial Fibrillation   Echo 08/06/17 Left ventricle: The cavity size was normal. There was moderate   focal basal hypertrophy. Systolic function was normal. The   estimated ejection fraction was in the range of 55% to 60%. Wall   motion was normal; there were no regional wall motion   abnormalities. The study was not technically sufficient to allow   Neuro/Psych    GI/Hepatic GERD  ,  Endo/Other  diabetesHypothyroidism   Renal/GU Renal InsufficiencyRenal disease     Musculoskeletal   Abdominal   Peds  Hematology  (+) anemia ,   Anesthesia Other Findings   Reproductive/Obstetrics                             Anesthesia Physical Anesthesia Plan  ASA: III  Anesthesia Plan: General   Post-op Pain Management:    Induction: Intravenous  PONV Risk Score and Plan:   Airway Management Planned: Nasal Cannula and Natural Airway  Additional Equipment:   Intra-op Plan:   Post-operative Plan:   Informed Consent: I have reviewed the patients History and Physical, chart, labs and discussed the procedure including the risks, benefits and alternatives for the proposed anesthesia with the patient or authorized representative who has indicated his/her understanding and acceptance.     Dental advisory given  Plan Discussed with: CRNA  Anesthesia Plan Comments:         Anesthesia Quick Evaluation

## 2018-06-18 NOTE — Addendum Note (Signed)
Addended by: Kerry Dory on: 06/18/2018 03:04 PM   Modules accepted: Orders

## 2018-06-18 NOTE — Progress Notes (Signed)
Brought pt into prep area and asked about eliquis, patient stated he has not taken eliquis since his ekg last week, when snow was predicted and procedure rescheduled. Cardiology notified and NP coming by to talk with patient.

## 2018-06-19 ENCOUNTER — Telehealth (HOSPITAL_COMMUNITY): Payer: Self-pay

## 2018-06-19 ENCOUNTER — Other Ambulatory Visit (HOSPITAL_COMMUNITY): Payer: Self-pay

## 2018-06-19 NOTE — Telephone Encounter (Signed)
Wife called about cancelled Cardioversion.  Inquiring about new date.  Advised that new date for Cardioversion is March 16th with arrival time 7am at main hospital.  ALL instructions reviewed and PT TO TAKE ELIQUIS and letter to be mailed today with same instructions. Pt states understanding and appreciative.

## 2018-07-05 ENCOUNTER — Telehealth: Payer: Self-pay | Admitting: *Deleted

## 2018-07-05 NOTE — Telephone Encounter (Signed)
Copied from [place] [id number]. Topic: General - Inquiry >> Jul 05, 2018  9:05 AM Vernona Rieger wrote: Reason for CRM: [facility] with the [facility] called & the said the patient is there now & the provider needs his latest lab work - she is faxing something right now to the office. Please advise >> Jul 05, 2018 10:22 AM Onalee Hua, CMA wrote: Noted. Waiting on Fax.  >> Jul 05, 2018  3:41 PM Onalee Hua, CMA wrote: Fax received. Requested ov note and labs faxed to [facility] - Dr. Melrose Nakayama Fax: [phone] Phone: [phone] ext [phone].

## 2018-07-08 ENCOUNTER — Other Ambulatory Visit: Payer: Self-pay

## 2018-07-08 ENCOUNTER — Ambulatory Visit (HOSPITAL_COMMUNITY): Payer: Medicare Other | Admitting: Anesthesiology

## 2018-07-08 ENCOUNTER — Encounter (HOSPITAL_COMMUNITY): Payer: Self-pay

## 2018-07-08 ENCOUNTER — Encounter (HOSPITAL_COMMUNITY)
Admission: RE | Disposition: A | Discharge status: Home / Self Care | Payer: Self-pay | Source: Home / Self Care | Attending: Cardiology

## 2018-07-08 ENCOUNTER — Ambulatory Visit (HOSPITAL_COMMUNITY)
Admission: RE | Admit: 2018-07-08 | Discharge: 2018-07-08 | Disposition: A | Discharge status: Home / Self Care | Payer: Medicare Other | Attending: Cardiology | Admitting: Cardiology

## 2018-07-08 DIAGNOSIS — E1122 Type 2 diabetes mellitus with diabetic chronic kidney disease: Secondary | ICD-10-CM | POA: Diagnosis not present

## 2018-07-08 DIAGNOSIS — I130 Hypertensive heart and chronic kidney disease with heart failure and stage 1 through stage 4 chronic kidney disease, or unspecified chronic kidney disease: Secondary | ICD-10-CM | POA: Insufficient documentation

## 2018-07-08 DIAGNOSIS — Z79899 Other long term (current) drug therapy: Secondary | ICD-10-CM | POA: Diagnosis not present

## 2018-07-08 DIAGNOSIS — I872 Venous insufficiency (chronic) (peripheral): Secondary | ICD-10-CM | POA: Insufficient documentation

## 2018-07-08 DIAGNOSIS — N184 Chronic kidney disease, stage 4 (severe): Secondary | ICD-10-CM | POA: Insufficient documentation

## 2018-07-08 DIAGNOSIS — I484 Atypical atrial flutter: Secondary | ICD-10-CM | POA: Insufficient documentation

## 2018-07-08 DIAGNOSIS — Z7901 Long term (current) use of anticoagulants: Secondary | ICD-10-CM | POA: Diagnosis not present

## 2018-07-08 DIAGNOSIS — I2510 Atherosclerotic heart disease of native coronary artery without angina pectoris: Secondary | ICD-10-CM | POA: Insufficient documentation

## 2018-07-08 DIAGNOSIS — E785 Hyperlipidemia, unspecified: Secondary | ICD-10-CM | POA: Diagnosis not present

## 2018-07-08 DIAGNOSIS — I5032 Chronic diastolic (congestive) heart failure: Secondary | ICD-10-CM | POA: Insufficient documentation

## 2018-07-08 DIAGNOSIS — Z87891 Personal history of nicotine dependence: Secondary | ICD-10-CM | POA: Insufficient documentation

## 2018-07-08 DIAGNOSIS — M1990 Unspecified osteoarthritis, unspecified site: Secondary | ICD-10-CM | POA: Diagnosis not present

## 2018-07-08 DIAGNOSIS — E039 Hypothyroidism, unspecified: Secondary | ICD-10-CM | POA: Diagnosis not present

## 2018-07-08 DIAGNOSIS — I4892 Unspecified atrial flutter: Secondary | ICD-10-CM | POA: Diagnosis not present

## 2018-07-08 HISTORY — PX: CARDIOVERSION: SHX1299

## 2018-07-08 SURGERY — CARDIOVERSION
Anesthesia: General

## 2018-07-08 MED ORDER — PROPOFOL 10 MG/ML IV BOLUS
INTRAVENOUS | Status: DC | PRN
  Administered 2018-07-08: 40 mg via INTRAVENOUS

## 2018-07-08 MED ORDER — LIDOCAINE HCL (CARDIAC) PF 100 MG/5ML IV SOSY
PREFILLED_SYRINGE | INTRAVENOUS | Status: DC | PRN
  Administered 2018-07-08: 40 mg via INTRAVENOUS

## 2018-07-08 MED ORDER — SODIUM CHLORIDE 0.9 % IV SOLN
INTRAVENOUS | Status: DC | PRN
  Administered 2018-07-08: 08:00:00 via INTRAVENOUS

## 2018-07-08 NOTE — Transfer of Care (Signed)
Immediate Anesthesia Transfer of Care Note  Patient: Allen Holloway  Procedure(s) Performed: CARDIOVERSION (N/A )  Patient Location: Endoscopy Unit  Anesthesia Type:General  Level of Consciousness: awake, oriented and patient cooperative  Airway & Oxygen Therapy: Patient Spontanous Breathing and Patient connected to nasal cannula oxygen  Post-op Assessment: Report given to RN and Post -op Vital signs reviewed and stable  Post vital signs: Reviewed  Last Vitals:  Vitals Value Taken Time  BP    Temp    Pulse    Resp    SpO2      Last Pain:  Vitals:   07/08/18 0727  TempSrc: Oral  PainSc: 0-No pain         Complications: No apparent anesthesia complications

## 2018-07-08 NOTE — Procedures (Signed)
Electrical Cardioversion Procedure Note MARVEN VELEY [id number] 07/30/1926  Procedure: Electrical Cardioversion Indications:  Atrial Flutter  Procedure Details Consent: Risks of procedure as well as the alternatives and risks of each were explained to the (patient/caregiver).  Consent for procedure obtained. Time Out: Verified patient identification, verified procedure, site/side was marked, verified correct patient position, special equipment/implants available, medications/allergies/relevent history reviewed, required imaging and test results available.  Performed  Patient placed on cardiac monitor, pulse oximetry, supplemental oxygen as necessary.  Sedation given: Propofol per anesthesiology Pacer pads placed anterior and posterior chest.  Cardioverted 1 time(s).  Cardioverted at [place].  Evaluation Findings: Post procedure EKG shows: NSR, PACs and Wenckebach episodes noted.  Complications: None Patient did tolerate procedure well.  With Wenckebach block noted and HR in 50s-60s, will stop metoprolol.    Loralie Champagne 07/08/2018, 8:35 AM

## 2018-07-08 NOTE — Discharge Instructions (Signed)
Electrical Cardioversion, Care After °This sheet gives you information about how to care for yourself after your procedure. Your health care provider may also give you more specific instructions. If you have problems or questions, contact your health care provider. °What can I expect after the procedure? °After the procedure, it is common to have: °· Some redness on the skin where the shocks were given. °Follow these instructions at home: ° °· Do not drive for 24 hours if you were given a medicine to help you relax (sedative). °· Take over-the-counter and prescription medicines only as told by your health care provider. °· Ask your health care provider how to check your pulse. Check it often. °· Rest for 48 hours after the procedure or as told by your health care provider. °· Avoid or limit your caffeine use as told by your health care provider. °Contact a health care provider if: °· You feel like your heart is beating too quickly or your pulse is not regular. °· You have a serious muscle cramp that does not go away. °Get help right away if: ° °· You have discomfort in your chest. °· You are dizzy or you feel faint. °· You have trouble breathing or you are short of breath. °· Your speech is slurred. °· You have trouble moving an arm or leg on one side of your body. °· Your fingers or toes turn cold or blue. °This information is not intended to replace advice given to you by your health care provider. Make sure you discuss any questions you have with your health care provider. °Document Released: 01/29/2013 Document Revised: 11/12/2015 Document Reviewed: 10/15/2015 °Elsevier Interactive Patient Education © 2019 Elsevier Inc. ° °

## 2018-07-08 NOTE — H&P (Signed)
Physician History and Physical    Allen Holloway MRN: [id number] DOB/AGE: December 16, 1926 85 y.o. Admit date: 07/08/2018  HPI:  Patient remains in atrial flutter, atypical.  He has not missed any Eliquis.   Review of systems complete and found to be negative unless listed above   Past Medical History:  Diagnosis Date  . Anemia of chronic disease    +anemia of CRI  . Atrial flutter ([place])    05/15/18-->started on eliquis and plan is for DCCV after he has been on this for 1 month  . CAD (coronary artery disease)   . Carotid bruit    60-79% bilat In carot sten, stable over serial exams.  04/2015 stable 84-53% RICA, 64-68% LICA.  Repeat 1 yr.  . Cholelithiasis   . Chronic diastolic heart failure ([place])    CHF clinic  . Chronic renal insufficiency, stage 4 (severe) (HCC)    GFR 20-25 ml/min as of 10/2017.  11/05/17 renal u/s-->medical renal dz.  Initial eval by neph 11/2017--nephrosclerosis.  . 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  Avoid BB/verap/dilt  . COPD (chronic obstructive pulmonary disease) ([place])   . Diabetes mellitus without complication ([place])    0/3212 HbA1c 6.8%  . Diverticulosis   . GERD (gastroesophageal reflux disease)   . Hyperlipidemia   . Hypertension   . Hypothyroidism   . Lumbago   . Osteoarthritis   . Right hip pain    Trochanteric bursitis: Dr. Alvan Dame injected this 09/2015.  Hip x-rays normal at Dr. Aurea Graff office.  . Venous insufficiency of both lower extremities      Family History  Problem Relation Age of Onset  . Hyperlipidemia Other   . Hypertension Other   . Colon cancer Neg Hx     Social History   Socioeconomic History  . Marital status: Married    Spouse name: Not on file  . Number of children: Not on file  . Years of education: Not on file  . Highest education level: Not on file  Occupational History  .  Occupation: retired  Scientific laboratory technician  . Financial resource strain: Not on file  . Food insecurity:    Worry: Not on file    Inability: Not on file  . Transportation needs:    Medical: Not on file    Non-medical: Not on file  Tobacco Use  . Smoking status: Former Research scientist (life sciences)  . Smokeless tobacco: Never Used  . Tobacco comment: quit 20-20yrs ago.  Substance and Sexual Activity  . Alcohol use: No  . Drug use: No  . Sexual activity: Not on file  Lifestyle  . Physical activity:    Days per week: Not on file    Minutes per session: Not on file  . Stress: Not on file  Relationships  . Social connections:    Talks on phone: Not on file    Gets together: Not on file    Attends religious service: Not on file    Active member of club or organization: Not on file    Attends meetings of clubs or organizations: Not on file    Relationship status: Not on file  . Intimate partner violence:    Fear of current or ex partner: Not on file    Emotionally abused: Not on file    Physically abused: Not on file  Forced sexual activity: Not on file  Other Topics Concern  . Not on file  Social History Narrative   Married, has 2 stepsons.   Occupation: retired truckdriver x 50 yrs.   Never smoked more than a few years when a teenager, otherwise is a nonsmoker.   No alcohol.   Gets wellness at [facility] q 1 yr.        Daily caffeine use--4 cups daily.     Medications Prior to Admission  Medication Sig Dispense Refill Last Dose  . acetaminophen (TYLENOL) 500 MG tablet Take 500 mg by mouth every 6 (six) hours as needed for moderate pain or headache.   Past Week at Unknown time  . apixaban (ELIQUIS) 5 MG TABS tablet Take 1 tablet (5 mg total) by mouth 2 (two) times daily. 60 tablet 11 07/08/2018 at Unknown time  . atorvastatin (LIPITOR) 20 MG tablet Take 10 mg by mouth at bedtime.    07/07/2018 at Unknown time  . Coenzyme Q10 (COQ10) 100 MG CAPS Take 100 mg by mouth daily.   07/07/2018 at Unknown time  .  furosemide (LASIX) 40 MG tablet Take 1.5 tablets (60 mg total) by mouth daily. 30 tablet 2 07/07/2018 at Unknown time  . Garlic 2706 MG CAPS Take 1,000 mg by mouth daily.    07/07/2018 at Unknown time  . Krill Oil 500 MG CAPS Take 500 mg by mouth daily.   07/07/2018 at Unknown time  . levothyroxine (SYNTHROID, LEVOTHROID) 100 MCG tablet Take 100 mcg by mouth daily.     07/07/2018 at Unknown time  . metoprolol tartrate (LOPRESSOR) 25 MG tablet Take 12.5 mg by mouth 2 (two) times daily.   07/07/2018 at Unknown time  . omeprazole (PRILOSEC) 20 MG capsule Take 20 mg by mouth daily.    07/07/2018 at Unknown time  . Polyethyl Glycol-Propyl Glycol (SYSTANE ULTRA OP) Place 1 drop into both eyes 2 (two) times daily.   Past Week at Unknown time  . trolamine salicylate (ASPERCREME) 10 % cream Apply 1 application topically as needed for muscle pain.   Past Month at Unknown time    Physical Exam: Blood pressure (!) 151/59, pulse 61, temperature 97.9 F (36.6 C), temperature source Oral, resp. rate 20, height 5\' 8"  (1.727 m), weight 90.7 kg, SpO2 97 %.  General: NAD Neck: No JVD, no thyromegaly or thyroid nodule.  Lungs: Clear to auscultation bilaterally with normal respiratory effort. CV: Nondisplaced PMI.  Heart regular S1/S2, no S3/S4, no murmur.  No peripheral edema.  No carotid bruit.  Normal pedal pulses.  Abdomen: Soft, nontender, no hepatosplenomegaly, no distention.  Skin: Intact without lesions or rashes.  Neurologic: Alert and oriented x 3.  Psych: Normal affect. Extremities: No clubbing or cyanosis.  HEENT: Normal.   Labs:   Lab Results  Component Value Date   WBC 9.4 05/27/2018   HGB 12.5 (L) 05/27/2018   HCT 38.6 (L) 05/27/2018   MCV 90.2 05/27/2018   PLT 204.0 05/27/2018   No results for input(s): NA, K, CL, CO2, BUN, CREATININE, CALCIUM, PROT, BILITOT, ALKPHOS, ALT, AST, GLUCOSE in the last 168 hours.  Invalid input(s): LABALBU Lab Results  Component Value Date   CKTOTAL 122  01/09/2013   TROPONINI 0.19 (HH) 08/04/2017    Lab Results  Component Value Date   CHOL 124 02/25/2018   CHOL 104 01/24/2017   CHOL 106 05/31/2016   Lab Results  Component Value Date   HDL 50.70 02/25/2018   HDL 41.70 01/24/2017  HDL 37.30 (L) 05/31/2016   Lab Results  Component Value Date   LDLCALC 58 02/25/2018   LDLCALC 48 01/24/2017   LDLCALC 57 05/31/2016   Lab Results  Component Value Date   TRIG 77.0 02/25/2018   TRIG 75.0 01/24/2017   TRIG 58.0 05/31/2016   Lab Results  Component Value Date   CHOLHDL 2 02/25/2018   CHOLHDL 3 01/24/2017   CHOLHDL 3 05/31/2016   No results found for: LDLDIRECT    ASSESSMENT AND PLAN: Patient presents for DCCV.  Has not missed Eliquis and remains in atypical atrial flutter.   Signed: Loralie Champagne 07/08/2018, 8:27 AM

## 2018-07-08 NOTE — Anesthesia Postprocedure Evaluation (Signed)
Anesthesia Post Note  Patient: Allen Holloway  Procedure(s) Performed: CARDIOVERSION (N/A )     Patient location during evaluation: PACU Anesthesia Type: General Level of consciousness: awake and alert and oriented Pain management: pain level controlled Vital Signs Assessment: post-procedure vital signs reviewed and stable Respiratory status: spontaneous breathing, nonlabored ventilation and respiratory function stable Cardiovascular status: blood pressure returned to baseline and stable Postop Assessment: no apparent nausea or vomiting Anesthetic complications: no    Last Vitals:  Vitals:   07/08/18 0839 07/08/18 0844  BP: (!) 119/41 (!) 119/41  Pulse: (!) 46 (!) 48  Resp: 15 16  Temp:  36.6 C  SpO2: 100% 100%    Last Pain:  Vitals:   07/08/18 0844  TempSrc: Oral  PainSc: 0-No pain                 [name]

## 2018-07-08 NOTE — Anesthesia Procedure Notes (Signed)
Procedure Name: General with mask airway Date/Time: 07/08/2018 8:30 AM Performed by: Bryson Corona, CRNA Pre-anesthesia Checklist: Patient identified, Emergency Drugs available, Suction available, Patient being monitored and Timeout performed Patient Re-evaluated:Patient Re-evaluated prior to induction Oxygen Delivery Method: Ambu bag

## 2018-07-08 NOTE — Anesthesia Preprocedure Evaluation (Addendum)
Anesthesia Evaluation  Patient identified by MRN, date of birth, ID band Patient awake    Reviewed: Allergy & Precautions, NPO status , Patient's Chart, lab work & pertinent test results  Airway Mallampati: II  TM Distance: >3 FB Neck ROM: Full    Dental  (+) Upper Dentures, Partial Lower, Dental Advisory Given   Pulmonary COPD,  COPD inhaler, former smoker,    Pulmonary exam normal breath sounds clear to auscultation       Cardiovascular hypertension, Pt. on medications + CAD, + CABG, + Peripheral Vascular Disease and +CHF  + dysrhythmias Atrial Fibrillation  Rhythm:Irregular Rate:Normal  Echo 08/06/2017 Left ventricle: The cavity size was normal. There was moderate focal basal hypertrophy. Systolic function was normal. The estimated ejection fraction was in the range of 55% to 60%. Wall motion was normal; there were no regional wall motion abnormalities. The study was not technically sufficient to allow evaluation of LV diastolic dysfunction due to atrial fibrillation. Doppler parameters are consistent with high ventricular filling pressure. - Aortic valve: Trileaflet; mildly thickened, mildly calcified   leaflets. There was trivial regurgitation. - Aorta: Ascending aortic diameter: 39 mm (S). - Ascending aorta: The ascending aorta was mildly dilated. - Mitral valve: There was mild regurgitation. - Left atrium: The atrium was mildly dilated. - Right ventricle: The cavity size was moderately dilated. Wal thickness was normal. - Tricuspid valve: There was moderate regurgitation. - Pulmonic valve: There was trivial regurgitation. - Pulmonary arteries: PA peak pressure: 49 mm Hg (S).  Impressions:  - The right ventricular systolic pressure was increased consistent with moderate pulmonary hypertension.  EKG 06/13/2018- Atrial fibrillation  CABG 3v 2006  Bilateral carotid stenosis R 40-59% L 1-39%   Neuro/Psych negative  neurological ROS  negative psych ROS   GI/Hepatic Neg liver ROS, GERD  Medicated and Controlled,  Endo/Other  diabetes, Well Controlled, Type 2Hypothyroidism   Renal/GU Renal InsufficiencyRenal disease  negative genitourinary   Musculoskeletal  (+) Arthritis , Osteoarthritis,    Abdominal (+) + obese,   Peds  Hematology  (+) anemia , Eliquis therapy   Anesthesia Other Findings   Reproductive/Obstetrics                            Anesthesia Physical Anesthesia Plan  ASA: III  Anesthesia Plan: General   Post-op Pain Management:    Induction: Intravenous  PONV Risk Score and Plan: 2 and Ondansetron and Treatment may vary due to age or medical condition  Airway Management Planned: Mask  Additional Equipment:   Intra-op Plan:   Post-operative Plan:   Informed Consent: I have reviewed the patients History and Physical, chart, labs and discussed the procedure including the risks, benefits and alternatives for the proposed anesthesia with the patient or authorized representative who has indicated his/her understanding and acceptance.     Dental advisory given  Plan Discussed with: Surgeon and CRNA  Anesthesia Plan Comments:         Anesthesia Quick Evaluation

## 2018-07-09 ENCOUNTER — Encounter (HOSPITAL_COMMUNITY): Payer: Self-pay | Admitting: Cardiology

## 2018-07-10 ENCOUNTER — Encounter: Payer: Self-pay | Admitting: Family Medicine

## 2018-07-12 ENCOUNTER — Encounter: Payer: Self-pay | Admitting: Family Medicine

## 2018-07-30 ENCOUNTER — Telehealth: Payer: Self-pay | Admitting: Family Medicine

## 2018-07-30 ENCOUNTER — Other Ambulatory Visit: Payer: Self-pay | Admitting: Family Medicine

## 2018-07-30 NOTE — Telephone Encounter (Signed)
Pt's wife, Everlene Farrier advised on Eliquis refill.

## 2018-07-30 NOTE — Telephone Encounter (Signed)
Pls call pt and notify him that the medication he requested that I refill for him (eliquis) when I saw him in our parking lot last week has been rx'd by Dr. Orpah Greek has many RF's and just has to request a RF from his pharmacy.-thx

## 2018-07-30 NOTE — Progress Notes (Signed)
A user error has taken place: encounter opened in error, closed for administrative reasons.

## 2018-08-05 ENCOUNTER — Other Ambulatory Visit (HOSPITAL_COMMUNITY): Payer: Self-pay | Admitting: *Deleted

## 2018-08-05 MED ORDER — APIXABAN 5 MG PO TABS: 5.0000 mg | ORAL_TABLET | Freq: Two times a day (BID) | ORAL | 3 refills | Status: DC

## 2018-08-20 ENCOUNTER — Other Ambulatory Visit (HOSPITAL_COMMUNITY): Payer: Self-pay

## 2018-08-20 MED ORDER — FUROSEMIDE 40 MG PO TABS: 60.0000 mg | ORAL_TABLET | Freq: Every day | ORAL | 6 refills | Status: DC

## 2018-08-27 ENCOUNTER — Ambulatory Visit: Payer: Medicare Other | Admitting: Family Medicine

## 2018-09-13 ENCOUNTER — Encounter (HOSPITAL_COMMUNITY): Payer: Medicare Other | Admitting: Cardiology

## 2018-10-22 ENCOUNTER — Telehealth (HOSPITAL_COMMUNITY): Payer: Self-pay | Admitting: Cardiology

## 2018-10-22 NOTE — Telephone Encounter (Signed)
Called and left message for patient.  Need to r/s 11/06/2018 appt with Dr. Aundra Dubin, as DM will be out of the office part of that morning.

## 2018-11-05 ENCOUNTER — Telehealth (HOSPITAL_COMMUNITY): Payer: Self-pay | Admitting: Cardiology

## 2018-11-05 NOTE — Telephone Encounter (Signed)
Patients wife called to request most recent office visit and rx for Eliquis be sent to [facility] so patient can get filled through [facility]  Most recent OV sent to Dr. Roosevelt Locks via fax/ epic routing Fax # [phone] With a request to rewrite eliquis rx

## 2018-11-06 ENCOUNTER — Encounter (HOSPITAL_COMMUNITY): Payer: Medicare Other | Admitting: Cardiology

## 2018-11-07 ENCOUNTER — Other Ambulatory Visit (HOSPITAL_COMMUNITY): Payer: Self-pay | Admitting: *Deleted

## 2018-11-07 MED ORDER — APIXABAN 5 MG PO TABS: 5.0000 mg | ORAL_TABLET | Freq: Two times a day (BID) | ORAL | 3 refills | Status: DC

## 2018-11-15 ENCOUNTER — Other Ambulatory Visit (HOSPITAL_COMMUNITY): Payer: Self-pay

## 2018-11-15 ENCOUNTER — Telehealth (HOSPITAL_COMMUNITY): Payer: Self-pay

## 2018-11-15 MED ORDER — APIXABAN 5 MG PO TABS: 5.0000 mg | ORAL_TABLET | Freq: Two times a day (BID) | ORAL | 0 refills | Status: DC

## 2018-11-15 NOTE — Telephone Encounter (Signed)
Patients wife called to request most recent office visit and rx for Eliquis be sent to [facility] so patient can get filled through [facility]. This was completed on 7/16 and again 7/24.   Most recent OV and prescription sent to [facility] via fax/ epic routing Fax # [phone]  Will send 10 tablets to [facility] per wife's request.

## 2018-12-11 ENCOUNTER — Telehealth: Payer: Self-pay | Admitting: Family Medicine

## 2018-12-11 NOTE — Telephone Encounter (Signed)
Called patient and detailed message. Asked patient to call me back. Will try calling patient tomorrow

## 2018-12-11 NOTE — Telephone Encounter (Signed)
Pt needs to schedule f/u with congestive heart failure clinic (Dr. Aundra Dubin). If he cannot be seen there on 8/20 or 8/21 then pls work him in on my schedule at the end of the afternoon on either day.  In the meantime, he can increase daily lasix dosing to 80 mg once a day (2 of his 40mg  tabs).-thx

## 2018-12-11 NOTE — Telephone Encounter (Signed)
Error

## 2018-12-11 NOTE — Telephone Encounter (Signed)
Patients legs are leaking fluid. Its been going on x 1 week. Patient is taking medication. Some swelling in both legs. Asked how patients bp is, wife stated that his bp is fine. Patient does elevate his legs. Patients wife would like to schedule an appointment but there isn't any openings. She is wondering what he can do in the meantime?

## 2018-12-11 NOTE — Telephone Encounter (Signed)
Patient wife reporting that patient's legs are leaking a lot.  She requested an appt, Dr. Anitra Lauth has no availability.  Wife requested a increase in meds ???    Please advise and contact [phone]  Thank you

## 2018-12-12 NOTE — Telephone Encounter (Signed)
Called patients wife. Patient has an appointment Monday with his heart doctor. She has increased patients lasix to 80mg  once daily. Patient is doing well, she reports.

## 2018-12-16 ENCOUNTER — Encounter (HOSPITAL_COMMUNITY): Payer: Self-pay

## 2018-12-16 ENCOUNTER — Ambulatory Visit (HOSPITAL_COMMUNITY)
Admission: RE | Admit: 2018-12-16 | Discharge: 2018-12-16 | Disposition: A | Discharge status: Home / Self Care | Payer: Medicare Other | Source: Ambulatory Visit | Attending: Cardiology | Admitting: Cardiology

## 2018-12-16 ENCOUNTER — Encounter (HOSPITAL_COMMUNITY): Payer: Self-pay | Admitting: Cardiology

## 2018-12-16 ENCOUNTER — Other Ambulatory Visit: Payer: Self-pay

## 2018-12-16 ENCOUNTER — Other Ambulatory Visit (HOSPITAL_COMMUNITY): Payer: Self-pay

## 2018-12-16 VITALS — BP 138/64 | HR 84 | Wt 197.8 lb

## 2018-12-16 DIAGNOSIS — I4891 Unspecified atrial fibrillation: Secondary | ICD-10-CM | POA: Diagnosis not present

## 2018-12-16 DIAGNOSIS — I2510 Atherosclerotic heart disease of native coronary artery without angina pectoris: Secondary | ICD-10-CM | POA: Insufficient documentation

## 2018-12-16 DIAGNOSIS — Z7901 Long term (current) use of anticoagulants: Secondary | ICD-10-CM | POA: Diagnosis not present

## 2018-12-16 DIAGNOSIS — I484 Atypical atrial flutter: Secondary | ICD-10-CM | POA: Diagnosis not present

## 2018-12-16 DIAGNOSIS — Z951 Presence of aortocoronary bypass graft: Secondary | ICD-10-CM | POA: Insufficient documentation

## 2018-12-16 DIAGNOSIS — Z79899 Other long term (current) drug therapy: Secondary | ICD-10-CM | POA: Insufficient documentation

## 2018-12-16 DIAGNOSIS — Z8249 Family history of ischemic heart disease and other diseases of the circulatory system: Secondary | ICD-10-CM | POA: Insufficient documentation

## 2018-12-16 DIAGNOSIS — I130 Hypertensive heart and chronic kidney disease with heart failure and stage 1 through stage 4 chronic kidney disease, or unspecified chronic kidney disease: Secondary | ICD-10-CM | POA: Diagnosis not present

## 2018-12-16 DIAGNOSIS — E039 Hypothyroidism, unspecified: Secondary | ICD-10-CM | POA: Diagnosis not present

## 2018-12-16 DIAGNOSIS — Z87891 Personal history of nicotine dependence: Secondary | ICD-10-CM | POA: Diagnosis not present

## 2018-12-16 DIAGNOSIS — E785 Hyperlipidemia, unspecified: Secondary | ICD-10-CM | POA: Insufficient documentation

## 2018-12-16 DIAGNOSIS — I6523 Occlusion and stenosis of bilateral carotid arteries: Secondary | ICD-10-CM | POA: Insufficient documentation

## 2018-12-16 DIAGNOSIS — I480 Paroxysmal atrial fibrillation: Secondary | ICD-10-CM

## 2018-12-16 DIAGNOSIS — K219 Gastro-esophageal reflux disease without esophagitis: Secondary | ICD-10-CM | POA: Diagnosis not present

## 2018-12-16 DIAGNOSIS — N184 Chronic kidney disease, stage 4 (severe): Secondary | ICD-10-CM | POA: Diagnosis not present

## 2018-12-16 DIAGNOSIS — J449 Chronic obstructive pulmonary disease, unspecified: Secondary | ICD-10-CM | POA: Insufficient documentation

## 2018-12-16 DIAGNOSIS — Z79890 Hormone replacement therapy (postmenopausal): Secondary | ICD-10-CM | POA: Diagnosis not present

## 2018-12-16 DIAGNOSIS — I5032 Chronic diastolic (congestive) heart failure: Secondary | ICD-10-CM | POA: Insufficient documentation

## 2018-12-16 DIAGNOSIS — I441 Atrioventricular block, second degree: Secondary | ICD-10-CM | POA: Insufficient documentation

## 2018-12-16 DIAGNOSIS — M1990 Unspecified osteoarthritis, unspecified site: Secondary | ICD-10-CM | POA: Diagnosis not present

## 2018-12-16 LAB — BASIC METABOLIC PANEL
Anion gap: 13 (ref 5–15)
BUN: 34 mg/dL — ABNORMAL HIGH (ref 8–23)
CO2: 24 mmol/L (ref 22–32)
Calcium: 9.1 mg/dL (ref 8.9–10.3)
Chloride: 103 mmol/L (ref 98–111)
Creatinine, Ser: 3.31 mg/dL — ABNORMAL HIGH (ref 0.61–1.24)
GFR calc Af Amer: 18 mL/min — ABNORMAL LOW (ref >60)
GFR calc non Af Amer: 16 mL/min — ABNORMAL LOW (ref >60)
Glucose, Bld: 126 mg/dL — ABNORMAL HIGH (ref 70–99)
Potassium: 4.1 mmol/L (ref 3.5–5.1)
Sodium: 140 mmol/L (ref 135–145)

## 2018-12-16 LAB — CBC
HCT: 28.9 % — ABNORMAL LOW (ref 39.0–52.0)
Hemoglobin: 8.2 g/dL — ABNORMAL LOW (ref 13.0–17.0)
MCH: 23.4 pg — ABNORMAL LOW (ref 26.0–34.0)
MCHC: 28.4 g/dL — ABNORMAL LOW (ref 30.0–36.0)
MCV: 82.3 fL (ref 80.0–100.0)
Platelets: 317 10*3/uL (ref 150–400)
RBC: 3.51 MIL/uL — ABNORMAL LOW (ref 4.22–5.81)
RDW: 16.6 % — ABNORMAL HIGH (ref 11.5–15.5)
WBC: 10.4 10*3/uL (ref 4.0–10.5)
nRBC: 0 % (ref 0.0–0.2)

## 2018-12-16 LAB — LIPID PANEL
Cholesterol: 89 mg/dL (ref 0–200)
HDL: 40 mg/dL — ABNORMAL LOW (ref >40)
LDL Cholesterol: 41 mg/dL (ref 0–99)
Total CHOL/HDL Ratio: 2.2 RATIO
Triglycerides: 40 mg/dL (ref <150)
VLDL: 8 mg/dL (ref 0–40)

## 2018-12-16 MED ORDER — FUROSEMIDE 40 MG PO TABS: ORAL_TABLET | ORAL | 6 refills | Status: DC

## 2018-12-16 MED ORDER — AMIODARONE HCL 200 MG PO TABS: ORAL_TABLET | ORAL | 3 refills | Status: DC

## 2018-12-16 NOTE — H&P (View-Only) (Signed)
Patient ID: Allen Holloway, male   DOB: 1927-01-17, 85 y.o.   MRN: [id number] PCP: Dr. Anitra Lauth Cardiology: Dr. Aundra Dubin  85 y.o. with history of CAD s/p CABG in 2005 with nonischemic myoview in 2008 and CKD stage III-IV presents for followup of CAD and chronic diastolic CHF. Echo in 11/18 showed EF 55-60%, moderately dilated RV with normal systolic function, PASP 56 mmHg.  He has had conduction abnormalities noted in the past and was taken off metoprolol.  Echo in 4/19 showed EF 55-60%, moderate RV dilation with normal systolic function.   In 3/20, he had DCCV of atrial fibrillation back to NSR.  He has been on Eliquis.   Today, he returns for followup of diastolic CHF and CAD.  Weight is overall down, but he continues to have lower leg edema.  2-3 weeks ago, he had significant exertional dyspnea, but this has improved.  Currently, he can walk on flat ground with his cane without significant dyspnea.  No chest pain.  No orthopnea/PND.  He is noted to be back in atrial fibrillation today.    ECG (personally reviewed): Atrial fibrillation rate 68  Labs (6/11): K 5.3, creatinine 1.6, LDL 70, HDL 58, HCT 37, TSH normal  Labs (11/12): K 4.5, creatinine 1.9, LDL 77, HDL 60 Labs (5/14): BNP 296 Labs (9/14): K 4.5, creatinine 1.8 Labs (10/14): K 4.6, creatinine 1.8, BNP 203 Labs (11/14): LDL 86, HDL 43, K 4.8, creatinine 2.0 Labs (1/16): K 5, creatinine 2.0, LDL 143, HDL 41.5 Labs (1/17): LDL 39, HDL 47, K 5, creatinine 2.0 Labs (10/18): LDL 48, HDL 42, K 5.1, creatinine 2.38 Labs (1/19): creatinine 3.34 Labs (11/19): K 4.5, creatinine 2.47, LDL 58, HDL 51 Labs (1/20): K 4.4, creatinine 2.99 Labs (2/20): TSH mildly elevated with normal free T4  Allergies (verified):  1) Lotensin (Benazepril Hcl)   Past Medical History:  1. COPD  2. Coronary artery disease: CABG 2005 with LIMA-LAD, SVG-PDA, SVG-D1, SVG-OM. Myoview (9/08) showed EF 63%, no scar or ischemia.  Echo (8/11): EF 55%, mild AI, mild MR,  moderate TR, mildly dilated RV, PA systolic pressure 34 mmHg.  Echo (6/14): EF 50-55%, mild LVH, mild AS, mild MR, mildly dilated RV.  3. Hyperlipidemia: Myalgias Lipitor and Crestor, 40 mg Lipitor.  4. Hypertension  5. Carotid bruit: Carotid dopplers (8/12) with 10-62% RICA, 69-48% LICA.  Carotid dopplers (4/14) with 60-79% bilateral ICA stenosis.  Carotid dopplers (10/14) with 60-79% bilateral ICA stenosis. Carotid dopplers (1/16) with 60-79% BICA stenosis.  - Carotid doppler (1/17): 54-62% RICA, 70-35% LICA.  - Carotid doppler (12/18): 40-59% RICA, right subclavian stenosis.  - Carotid dopplers (12/19): 40-59% BICA stenosis.  6. Cholelithiasis  7. CKD stage IV 8. Low back pain  9. Osteoarthritis  10. GERD  11. Hypothyroidism  12. ABIs normal 8/11, 3/15, 12/18.  13. 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  14. Chronic diastolic CHF - Echo (00/93): EF 55-60%, mild MR, moderate TR.  - Echo (11/18): EF 55-60%, no significant aortic stenosis, moderately dilated RV with normal systolic function, PASP 56 mmHg.  - Echo (4/19): EF 55-60%, moderate RV dilation with normal systolic function, PASP 49 mmHg.  15. Aortic stenosis: Mild on 6/14 echo, minimal on 10/16 echo.  16. Atrial flutter: Atypical.  Noted 1/20, DCCV to NSR in 3/20.  17. Atrial fibrillation: Noted 8/20.    Family History:  Family History High cholesterol  Family History Hypertension   Social History:  Retired  Married  Former Smoker   ROS: All systems reviewed and negative except as per HPI.   Current Outpatient Medications  Medication Sig Dispense Refill  . acetaminophen (TYLENOL) 500 MG tablet Take 500 mg by mouth every 6 (six) hours as needed for moderate pain or headache.    Marland Kitchen apixaban (ELIQUIS) 5 MG TABS tablet Take 1 tablet (5 mg total) by mouth 2 (two) times daily. 10 tablet 0  . atorvastatin (LIPITOR) 20 MG tablet Take 10 mg by mouth at bedtime.     . Coenzyme  Q10 (COQ10) 100 MG CAPS Take 100 mg by mouth daily.    . furosemide (LASIX) 40 MG tablet Take 1 tablet (40 mg total) by mouth every morning AND 0.5 tablets (20 mg total) every evening. 45 tablet 6  . Garlic 6948 MG CAPS Take 1,000 mg by mouth daily.     [name] Oil 500 MG CAPS Take 500 mg by mouth daily.    Marland Kitchen levothyroxine (SYNTHROID, LEVOTHROID) 100 MCG tablet Take 100 mcg by mouth daily.      . metoprolol tartrate (LOPRESSOR) 25 MG tablet Take 25 mg by mouth 2 (two) times daily.    Marland Kitchen omeprazole (PRILOSEC) 20 MG capsule Take 20 mg by mouth daily.     Vladimir Faster Glycol-Propyl Glycol (SYSTANE ULTRA OP) Place 1 drop into both eyes 2 (two) times daily.    Marland Kitchen trolamine salicylate (ASPERCREME) 10 % cream Apply 1 application topically as needed for muscle pain.    Marland Kitchen amiodarone (PACERONE) 200 MG tablet Take 200mg  twice a day for 10 days, THEN 200mg  daily 40 tablet 3   No current facility-administered medications for this encounter.     BP 138/64   Pulse 84   Wt 89.7 kg (197 lb 12.8 oz)   SpO2 98%   BMI 30.08 kg/m  General: NAD Neck: JVP 8-9 cm, no thyromegaly or thyroid nodule.  Lungs: Clear to auscultation bilaterally with normal respiratory effort. CV: Nondisplaced PMI.  Heart irregular S1/S2, no S3/S4, no murmur.  1+ edema 1/2 to knees bilaterally.  No carotid bruit.  Normal pedal pulses.  Abdomen: Soft, nontender, no hepatosplenomegaly, no distention.  Skin: Intact without lesions or rashes.  Neurologic: Alert and oriented x 3.  Psych: Normal affect. Extremities: No clubbing or cyanosis.  HEENT: Normal.   Assessment/Plan: 1. CAD: Stable without chest pain.   - Continue statin.  - No ASA given Eliquis use.  2. Chronic diastolic CHF:  He is at least mildly volume overloaded today.  This is worsened by CKD stage IV and probably also atrial fibrillation.     - Increase Lasix to 40 qam/20 qpm.  BMET today and again in 10 days.  - Will try to get him back into NSR as this may help control  CHF.  3. Hyperlipidemia: Myalgias with Crestor, pravastatin, and atorvastatin 40 daily.  He is able to tolerate atorvastatin 20 daily.   - Check lipids today.  4. Carotid stenosis: Repeat carotid dopplers in 12/20.  5. 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  No symptoms of bradycardia.  He is on low dose metoprolol and in atrial fibrillation today.   6. CKD: Stage IV.  BMET today.  - Nephrology referral.  7. Atrial arrhythmias: Atypical atrial flutter noted in 1/20, DCCV to NSR in 3/20.  Today, he is in atrial fibrillation.  He noted worsening dyspnea about  3 wks ago, may correspond to the onset of atrial fibrillation (triggering diastolic CHF).    - Continue Eliquis, he has not missed any doses. He is dosed at 2.5 mg bid for age and creatinine.   - I will have him start amiodarone 200 mg bid x 10 days then 200 mg daily.  I suspect that he will not hold NSR long-term without an anti-arrhythmic.  - I will arrange for DCCV in about a week.   Followup in atrial fibrillation clinic after DCCV and see me in 3 months.   Loralie Champagne 12/16/2018

## 2018-12-16 NOTE — Patient Instructions (Addendum)
INCREASE Lasix (Furosemide) to 40mg  (1 tab) in the morning and 20mg  (1/2 tab) in the evening.  START Amiodarone 200mg  (1 tab) twice a day for 10 days, THEN Amiodarone 200mg  (1 tab) daily  Labs today and repeat in 10 days We will only contact you if something comes back abnormal or we need to make some changes. Otherwise no news is good news!  You have been referred to [place] for evaluation of your kidney function.  They will call you to arrange an appointment.   Your physician recommends that you schedule a follow-up appointment in: 3 weeks with Afib clinic and in 3 months with Dr Aundra Dubin  At the [facility], you and your health needs are our priority. As part of our continuing mission to provide you with exceptional heart care, we have created designated Provider Care Teams. These Care Teams include your primary Cardiologist (physician) and Advanced Practice Providers (APPs- Physician Assistants and Nurse Practitioners) who all work together to provide you with the care you need, when you need it.   You may see any of the following providers on your designated Care Team at your next follow up: Marland Kitchen Dr Glori Bickers . Dr Loralie Champagne . Darrick Grinder, NP   Please be sure to bring in all your medications bottles to every appointment.     Marland Kitchen

## 2018-12-16 NOTE — Progress Notes (Signed)
Patient ID: Allen Holloway, male   DOB: Dec 31, 1926, 85 y.o.   MRN: [id number] PCP: Dr. Anitra Lauth Cardiology: Dr. Aundra Dubin  85 y.o. with history of CAD s/p CABG in 2005 with nonischemic myoview in 2008 and CKD stage III-IV presents for followup of CAD and chronic diastolic CHF. Echo in 11/18 showed EF 55-60%, moderately dilated RV with normal systolic function, PASP 56 mmHg.  He has had conduction abnormalities noted in the past and was taken off metoprolol.  Echo in 4/19 showed EF 55-60%, moderate RV dilation with normal systolic function.   In 3/20, he had DCCV of atrial fibrillation back to NSR.  He has been on Eliquis.   Today, he returns for followup of diastolic CHF and CAD.  Weight is overall down, but he continues to have lower leg edema.  2-3 weeks ago, he had significant exertional dyspnea, but this has improved.  Currently, he can walk on flat ground with his cane without significant dyspnea.  No chest pain.  No orthopnea/PND.  He is noted to be back in atrial fibrillation today.    ECG (personally reviewed): Atrial fibrillation rate 68  Labs (6/11): K 5.3, creatinine 1.6, LDL 70, HDL 58, HCT 37, TSH normal  Labs (11/12): K 4.5, creatinine 1.9, LDL 77, HDL 60 Labs (5/14): BNP 296 Labs (9/14): K 4.5, creatinine 1.8 Labs (10/14): K 4.6, creatinine 1.8, BNP 203 Labs (11/14): LDL 86, HDL 43, K 4.8, creatinine 2.0 Labs (1/16): K 5, creatinine 2.0, LDL 143, HDL 41.5 Labs (1/17): LDL 39, HDL 47, K 5, creatinine 2.0 Labs (10/18): LDL 48, HDL 42, K 5.1, creatinine 2.38 Labs (1/19): creatinine 3.34 Labs (11/19): K 4.5, creatinine 2.47, LDL 58, HDL 51 Labs (1/20): K 4.4, creatinine 2.99 Labs (2/20): TSH mildly elevated with normal free T4  Allergies (verified):  1) Lotensin (Benazepril Hcl)   Past Medical History:  1. COPD  2. Coronary artery disease: CABG 2005 with LIMA-LAD, SVG-PDA, SVG-D1, SVG-OM. Myoview (9/08) showed EF 63%, no scar or ischemia.  Echo (8/11): EF 55%, mild AI, mild MR,  moderate TR, mildly dilated RV, PA systolic pressure 34 mmHg.  Echo (6/14): EF 50-55%, mild LVH, mild AS, mild MR, mildly dilated RV.  3. Hyperlipidemia: Myalgias Lipitor and Crestor, 40 mg Lipitor.  4. Hypertension  5. Carotid bruit: Carotid dopplers (8/12) with 51-76% RICA, 16-07% LICA.  Carotid dopplers (4/14) with 60-79% bilateral ICA stenosis.  Carotid dopplers (10/14) with 60-79% bilateral ICA stenosis. Carotid dopplers (1/16) with 60-79% BICA stenosis.  - Carotid doppler (1/17): 37-10% RICA, 62-69% LICA.  - Carotid doppler (12/18): 40-59% RICA, right subclavian stenosis.  - Carotid dopplers (12/19): 40-59% BICA stenosis.  6. Cholelithiasis  7. CKD stage IV 8. Low back pain  9. Osteoarthritis  10. GERD  11. Hypothyroidism  12. ABIs normal 8/11, 3/15, 12/18.  13. 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  14. Chronic diastolic CHF - Echo (48/54): EF 55-60%, mild MR, moderate TR.  - Echo (11/18): EF 55-60%, no significant aortic stenosis, moderately dilated RV with normal systolic function, PASP 56 mmHg.  - Echo (4/19): EF 55-60%, moderate RV dilation with normal systolic function, PASP 49 mmHg.  15. Aortic stenosis: Mild on 6/14 echo, minimal on 10/16 echo.  16. Atrial flutter: Atypical.  Noted 1/20, DCCV to NSR in 3/20.  17. Atrial fibrillation: Noted 8/20.    Family History:  Family History High cholesterol  Family History Hypertension   Social History:  Retired  Married  Former Smoker   ROS: All systems reviewed and negative except as per HPI.   Current Outpatient Medications  Medication Sig Dispense Refill  . acetaminophen (TYLENOL) 500 MG tablet Take 500 mg by mouth every 6 (six) hours as needed for moderate pain or headache.    Marland Kitchen apixaban (ELIQUIS) 5 MG TABS tablet Take 1 tablet (5 mg total) by mouth 2 (two) times daily. 10 tablet 0  . atorvastatin (LIPITOR) 20 MG tablet Take 10 mg by mouth at bedtime.     . Coenzyme  Q10 (COQ10) 100 MG CAPS Take 100 mg by mouth daily.    . furosemide (LASIX) 40 MG tablet Take 1 tablet (40 mg total) by mouth every morning AND 0.5 tablets (20 mg total) every evening. 45 tablet 6  . Garlic 2505 MG CAPS Take 1,000 mg by mouth daily.     [name] Oil 500 MG CAPS Take 500 mg by mouth daily.    Marland Kitchen levothyroxine (SYNTHROID, LEVOTHROID) 100 MCG tablet Take 100 mcg by mouth daily.      . metoprolol tartrate (LOPRESSOR) 25 MG tablet Take 25 mg by mouth 2 (two) times daily.    Marland Kitchen omeprazole (PRILOSEC) 20 MG capsule Take 20 mg by mouth daily.     Vladimir Faster Glycol-Propyl Glycol (SYSTANE ULTRA OP) Place 1 drop into both eyes 2 (two) times daily.    Marland Kitchen trolamine salicylate (ASPERCREME) 10 % cream Apply 1 application topically as needed for muscle pain.    Marland Kitchen amiodarone (PACERONE) 200 MG tablet Take 200mg  twice a day for 10 days, THEN 200mg  daily 40 tablet 3   No current facility-administered medications for this encounter.     BP 138/64   Pulse 84   Wt 89.7 kg (197 lb 12.8 oz)   SpO2 98%   BMI 30.08 kg/m  General: NAD Neck: JVP 8-9 cm, no thyromegaly or thyroid nodule.  Lungs: Clear to auscultation bilaterally with normal respiratory effort. CV: Nondisplaced PMI.  Heart irregular S1/S2, no S3/S4, no murmur.  1+ edema 1/2 to knees bilaterally.  No carotid bruit.  Normal pedal pulses.  Abdomen: Soft, nontender, no hepatosplenomegaly, no distention.  Skin: Intact without lesions or rashes.  Neurologic: Alert and oriented x 3.  Psych: Normal affect. Extremities: No clubbing or cyanosis.  HEENT: Normal.   Assessment/Plan: 1. CAD: Stable without chest pain.   - Continue statin.  - No ASA given Eliquis use.  2. Chronic diastolic CHF:  He is at least mildly volume overloaded today.  This is worsened by CKD stage IV and probably also atrial fibrillation.     - Increase Lasix to 40 qam/20 qpm.  BMET today and again in 10 days.  - Will try to get him back into NSR as this may help control  CHF.  3. Hyperlipidemia: Myalgias with Crestor, pravastatin, and atorvastatin 40 daily.  He is able to tolerate atorvastatin 20 daily.   - Check lipids today.  4. Carotid stenosis: Repeat carotid dopplers in 12/20.  5. 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  No symptoms of bradycardia.  He is on low dose metoprolol and in atrial fibrillation today.   6. CKD: Stage IV.  BMET today.  - Nephrology referral.  7. Atrial arrhythmias: Atypical atrial flutter noted in 1/20, DCCV to NSR in 3/20.  Today, he is in atrial fibrillation.  He noted worsening dyspnea about  3 wks ago, may correspond to the onset of atrial fibrillation (triggering diastolic CHF).    - Continue Eliquis, he has not missed any doses. He is dosed at 2.5 mg bid for age and creatinine.   - I will have him start amiodarone 200 mg bid x 10 days then 200 mg daily.  I suspect that he will not hold NSR long-term without an anti-arrhythmic.  - I will arrange for DCCV in about a week.   Followup in atrial fibrillation clinic after DCCV and see me in 3 months.   Loralie Champagne 12/16/2018

## 2018-12-18 ENCOUNTER — Telehealth (HOSPITAL_COMMUNITY): Payer: Self-pay

## 2018-12-18 NOTE — Telephone Encounter (Signed)
Referral for [place] [facility] faxed. Confirmation received.

## 2018-12-18 NOTE — Telephone Encounter (Signed)
MD made aware, no changes for now.

## 2018-12-18 NOTE — Telephone Encounter (Signed)
Received call from [facility] stating that patient have been given wrong dose of Lasix. He was given lasix 80mg .  He had been taking wrong dose for approximately 2-3 weeks. Pharmacist states that patient was contacted and he was counseled on error.  Advised will make MD aware.

## 2018-12-21 IMAGING — CR DG CHEST 2V
3 series · 3 of 3 positions shown · non-contrast
Comparison: Radiograph 11/01/2013

CLINICAL DATA: Weakness.  Lightheadedness.

EXAM:
CHEST - 2 VIEW

[w chest lat (1 of 2)]
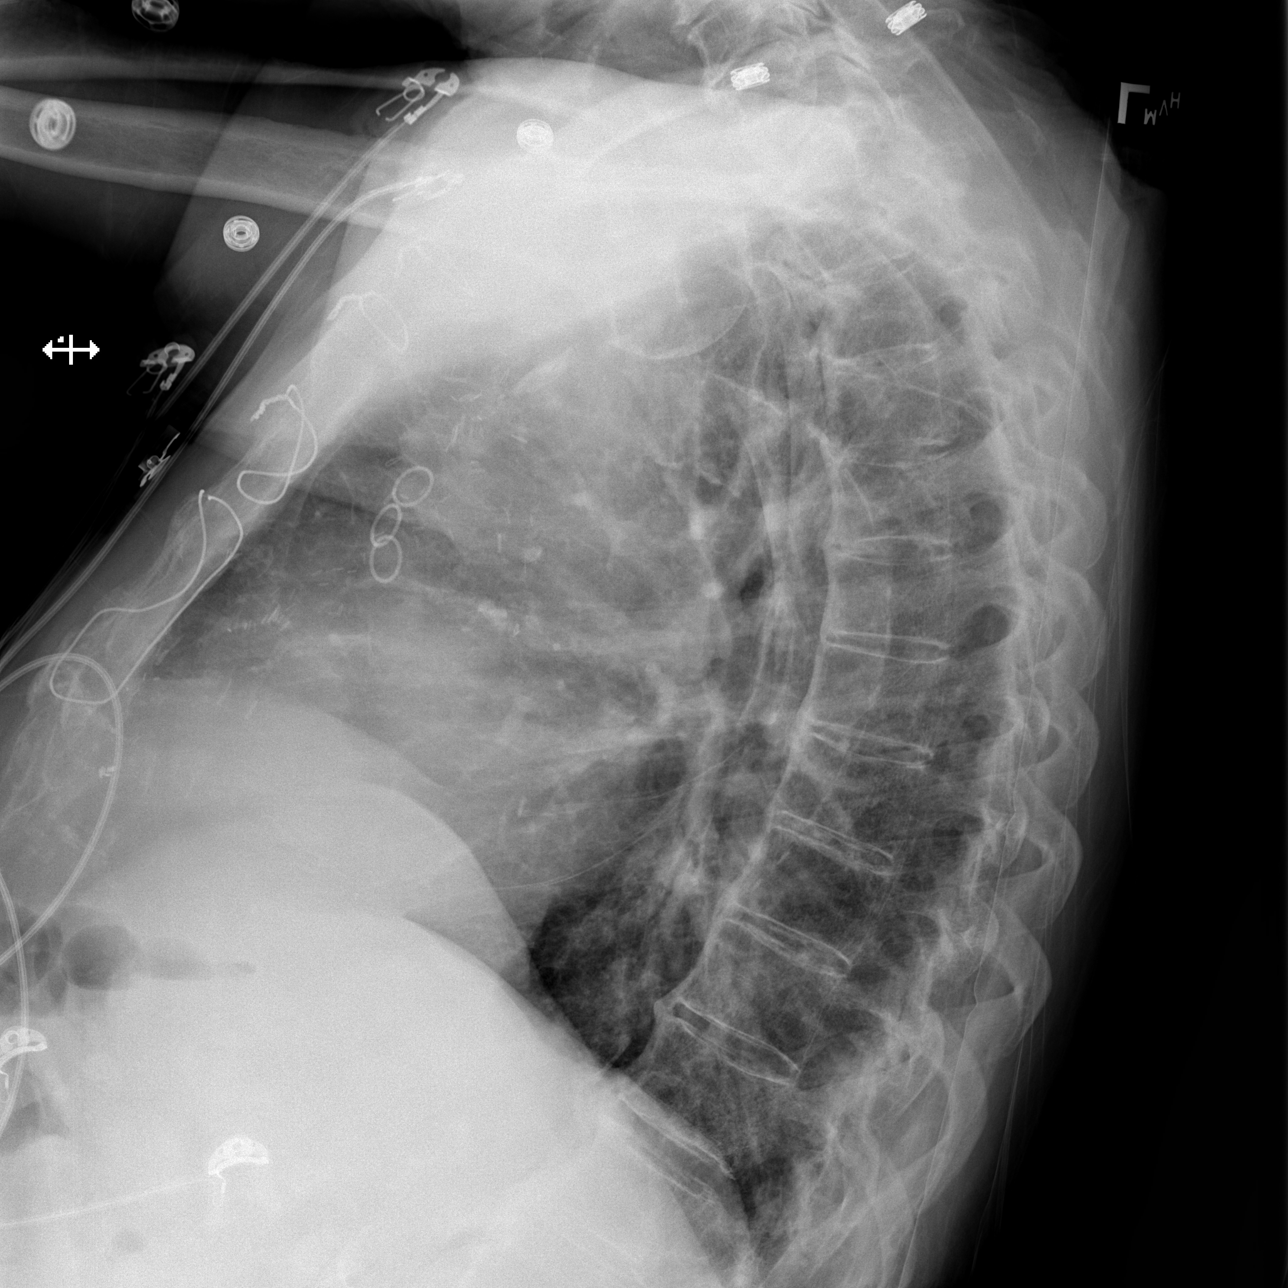

[w chest lat (2 of 2)]
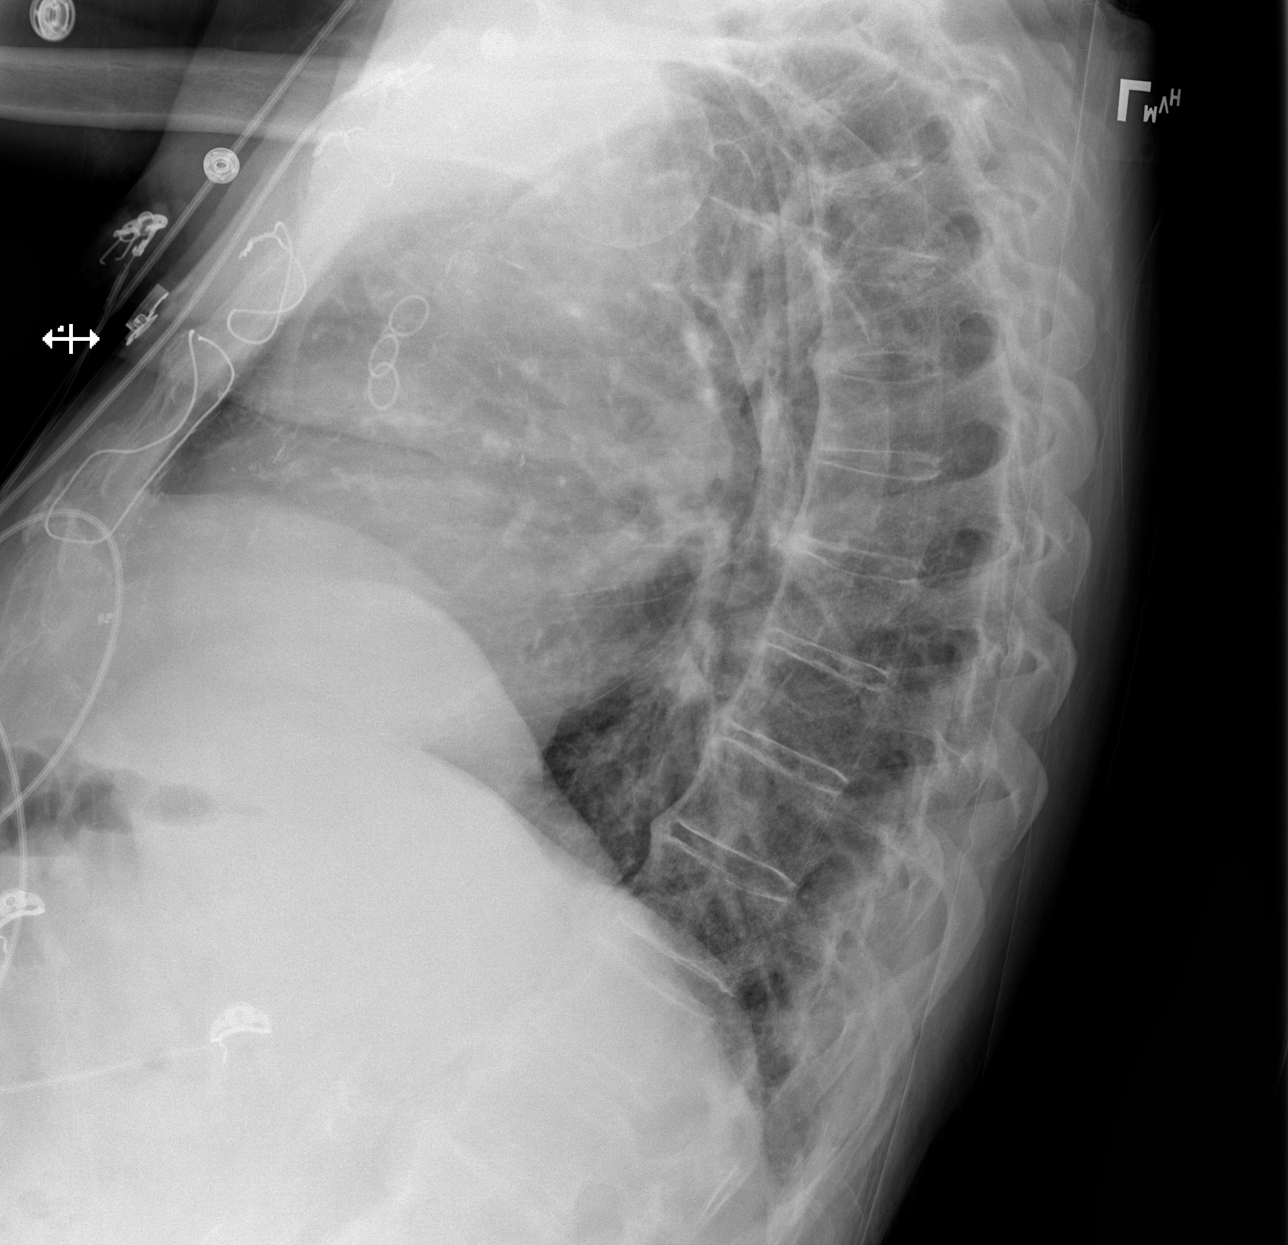

[x chest ap]
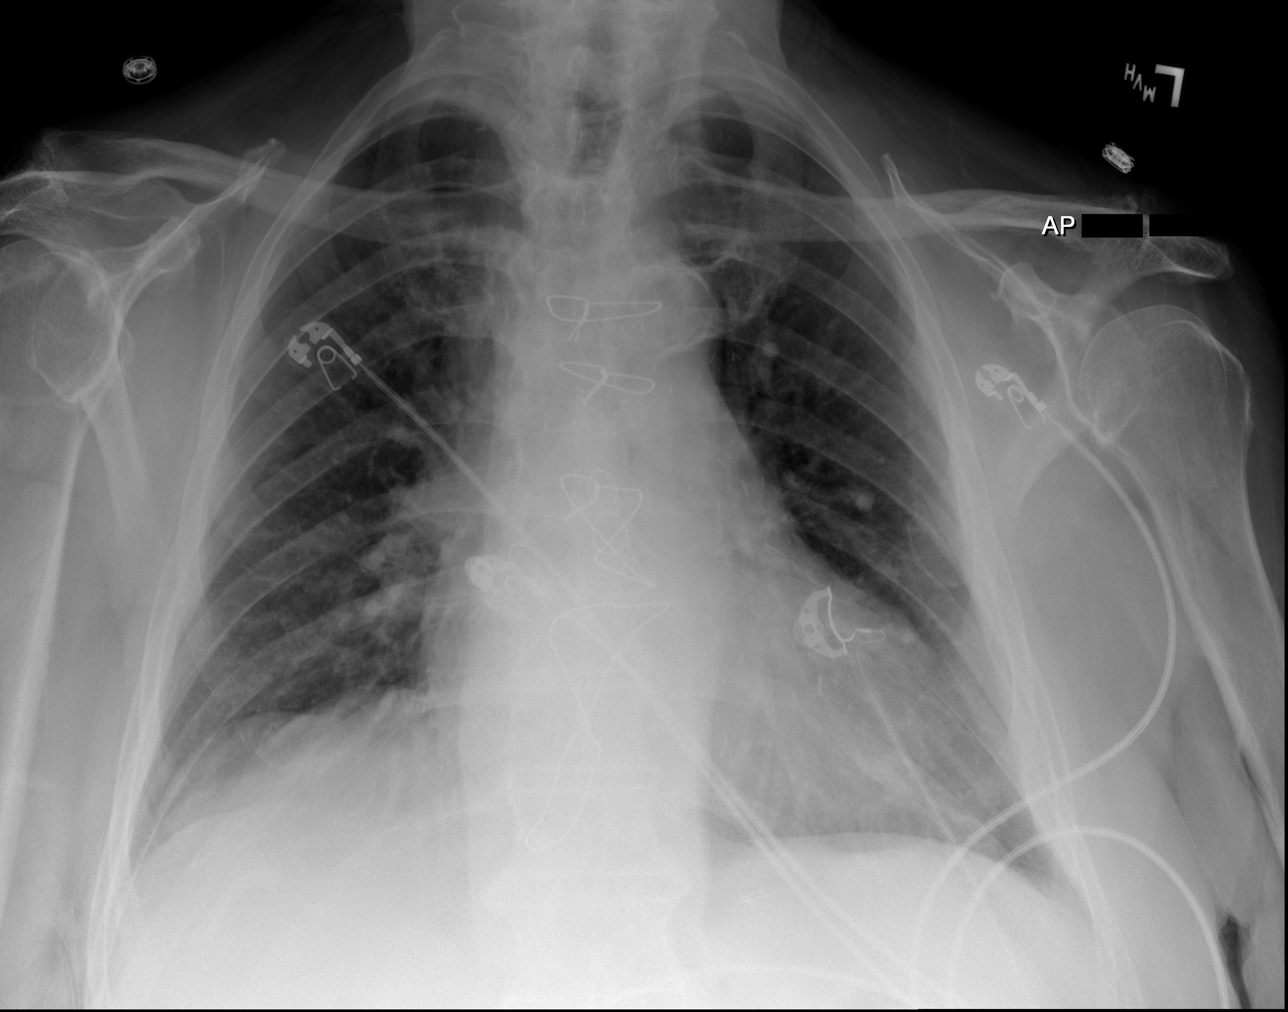

[3 of 3 positions shown; findings below may reference images not displayed]

FINDINGS: Post median sternotomy. Mild cardiomegaly with aortic
atherosclerosis. No pulmonary edema. Chronic elevation of right
hemidiaphragm. No focal airspace disease, pleural effusion or
pneumothorax. No acute osseous abnormalities.
IMPRESSION: Mild cardiomegaly and aortic atherosclerosis.  No acute findings.

## 2018-12-24 ENCOUNTER — Other Ambulatory Visit (HOSPITAL_COMMUNITY)
Admission: RE | Admit: 2018-12-24 | Discharge: 2018-12-24 | Disposition: A | Discharge status: Home / Self Care | Payer: Medicare Other | Source: Ambulatory Visit | Attending: Cardiology | Admitting: Cardiology

## 2018-12-24 ENCOUNTER — Encounter: Payer: Self-pay | Admitting: Family Medicine

## 2018-12-24 DIAGNOSIS — I4891 Unspecified atrial fibrillation: Secondary | ICD-10-CM | POA: Diagnosis not present

## 2018-12-24 DIAGNOSIS — Z20828 Contact with and (suspected) exposure to other viral communicable diseases: Secondary | ICD-10-CM | POA: Diagnosis not present

## 2018-12-24 DIAGNOSIS — Z01812 Encounter for preprocedural laboratory examination: Secondary | ICD-10-CM | POA: Insufficient documentation

## 2018-12-24 LAB — SARS CORONAVIRUS 2 (TAT 6-24 HRS): SARS Coronavirus 2: NEGATIVE

## 2018-12-26 ENCOUNTER — Other Ambulatory Visit (HOSPITAL_COMMUNITY): Payer: Medicare Other

## 2018-12-26 NOTE — Progress Notes (Signed)
Pre-op call made, patient confirmed they have been quarantined since COVID test, and have not been around anyone sick nor have felt sick themselves. All questions answered.

## 2018-12-27 ENCOUNTER — Ambulatory Visit (HOSPITAL_COMMUNITY): Payer: Medicare Other | Admitting: Certified Registered"

## 2018-12-27 ENCOUNTER — Encounter (HOSPITAL_COMMUNITY)
Admission: RE | Disposition: A | Discharge status: Home / Self Care | Payer: Medicare Other | Source: Home / Self Care | Attending: Cardiology

## 2018-12-27 ENCOUNTER — Other Ambulatory Visit: Payer: Self-pay

## 2018-12-27 ENCOUNTER — Encounter (HOSPITAL_COMMUNITY): Payer: Self-pay | Admitting: *Deleted

## 2018-12-27 ENCOUNTER — Ambulatory Visit (HOSPITAL_COMMUNITY)
Admission: RE | Admit: 2018-12-27 | Discharge: 2018-12-27 | Disposition: A | Discharge status: Home / Self Care | Payer: Medicare Other | Attending: Cardiology | Admitting: Cardiology

## 2018-12-27 DIAGNOSIS — Z7901 Long term (current) use of anticoagulants: Secondary | ICD-10-CM | POA: Insufficient documentation

## 2018-12-27 DIAGNOSIS — Z951 Presence of aortocoronary bypass graft: Secondary | ICD-10-CM | POA: Insufficient documentation

## 2018-12-27 DIAGNOSIS — K219 Gastro-esophageal reflux disease without esophagitis: Secondary | ICD-10-CM | POA: Insufficient documentation

## 2018-12-27 DIAGNOSIS — M1990 Unspecified osteoarthritis, unspecified site: Secondary | ICD-10-CM | POA: Diagnosis not present

## 2018-12-27 DIAGNOSIS — Z8249 Family history of ischemic heart disease and other diseases of the circulatory system: Secondary | ICD-10-CM | POA: Insufficient documentation

## 2018-12-27 DIAGNOSIS — Z79899 Other long term (current) drug therapy: Secondary | ICD-10-CM | POA: Insufficient documentation

## 2018-12-27 DIAGNOSIS — I4891 Unspecified atrial fibrillation: Secondary | ICD-10-CM | POA: Insufficient documentation

## 2018-12-27 DIAGNOSIS — E039 Hypothyroidism, unspecified: Secondary | ICD-10-CM | POA: Diagnosis not present

## 2018-12-27 DIAGNOSIS — E785 Hyperlipidemia, unspecified: Secondary | ICD-10-CM | POA: Diagnosis not present

## 2018-12-27 DIAGNOSIS — I130 Hypertensive heart and chronic kidney disease with heart failure and stage 1 through stage 4 chronic kidney disease, or unspecified chronic kidney disease: Secondary | ICD-10-CM | POA: Diagnosis not present

## 2018-12-27 DIAGNOSIS — I2510 Atherosclerotic heart disease of native coronary artery without angina pectoris: Secondary | ICD-10-CM | POA: Insufficient documentation

## 2018-12-27 DIAGNOSIS — J449 Chronic obstructive pulmonary disease, unspecified: Secondary | ICD-10-CM | POA: Diagnosis not present

## 2018-12-27 DIAGNOSIS — I350 Nonrheumatic aortic (valve) stenosis: Secondary | ICD-10-CM | POA: Diagnosis not present

## 2018-12-27 DIAGNOSIS — Z87891 Personal history of nicotine dependence: Secondary | ICD-10-CM | POA: Insufficient documentation

## 2018-12-27 DIAGNOSIS — I5032 Chronic diastolic (congestive) heart failure: Secondary | ICD-10-CM | POA: Diagnosis not present

## 2018-12-27 DIAGNOSIS — N184 Chronic kidney disease, stage 4 (severe): Secondary | ICD-10-CM | POA: Insufficient documentation

## 2018-12-27 DIAGNOSIS — N183 Chronic kidney disease, stage 3 (moderate): Secondary | ICD-10-CM | POA: Diagnosis not present

## 2018-12-27 HISTORY — PX: CARDIOVERSION: SHX1299

## 2018-12-27 LAB — PROTIME-INR
INR: 1.6 — ABNORMAL HIGH (ref 0.8–1.2)
Prothrombin Time: 18.9 seconds — ABNORMAL HIGH (ref 11.4–15.2)

## 2018-12-27 LAB — BASIC METABOLIC PANEL
Anion gap: 10 (ref 5–15)
BUN: 39 mg/dL — ABNORMAL HIGH (ref 8–23)
CO2: 24 mmol/L (ref 22–32)
Calcium: 9.3 mg/dL (ref 8.9–10.3)
Chloride: 105 mmol/L (ref 98–111)
Creatinine, Ser: 3.25 mg/dL — ABNORMAL HIGH (ref 0.61–1.24)
GFR calc Af Amer: 18 mL/min — ABNORMAL LOW (ref >60)
GFR calc non Af Amer: 16 mL/min — ABNORMAL LOW (ref >60)
Glucose, Bld: 133 mg/dL — ABNORMAL HIGH (ref 70–99)
Potassium: 3.9 mmol/L (ref 3.5–5.1)
Sodium: 139 mmol/L (ref 135–145)

## 2018-12-27 SURGERY — CARDIOVERSION
Anesthesia: General

## 2018-12-27 MED ORDER — SODIUM CHLORIDE 0.9 % IV SOLN
INTRAVENOUS | Status: AC | PRN
  Administered 2018-12-27: 500 mL via INTRAVENOUS

## 2018-12-27 MED ORDER — LIDOCAINE 2% (20 MG/ML) 5 ML SYRINGE
INTRAMUSCULAR | Status: DC | PRN
  Administered 2018-12-27: 20 mg via INTRAVENOUS

## 2018-12-27 MED ORDER — PROPOFOL 10 MG/ML IV BOLUS
INTRAVENOUS | Status: DC | PRN
  Administered 2018-12-27: 70 mg via INTRAVENOUS

## 2018-12-27 MED ORDER — AMIODARONE HCL 200 MG PO TABS: ORAL_TABLET | ORAL | 3 refills | Status: DC

## 2018-12-27 NOTE — Anesthesia Procedure Notes (Signed)
Procedure Name: MAC Date/Time: 12/27/2018 8:00 AM Performed by: Barrington Ellison, CRNA Pre-anesthesia Checklist: Patient identified, Emergency Drugs available, Suction available and Patient being monitored Patient Re-evaluated:Patient Re-evaluated prior to induction Oxygen Delivery Method: Simple face mask

## 2018-12-27 NOTE — Transfer of Care (Signed)
Immediate Anesthesia Transfer of Care Note  Patient: Allen Holloway  Procedure(s) Performed: CARDIOVERSION (N/A )  Patient Location: Endoscopy Unit  Anesthesia Type:MAC  Level of Consciousness: drowsy  Airway & Oxygen Therapy: Patient Spontanous Breathing  Post-op Assessment: Report given to RN  Post vital signs: Reviewed and stable  Last Vitals:  Vitals Value Taken Time  BP    Temp    Pulse    Resp    SpO2      Last Pain:  Vitals:   12/27/18 0713  PainSc: 0-No pain         Complications: No apparent anesthesia complications

## 2018-12-27 NOTE — Procedures (Signed)
Electrical Cardioversion Procedure Note Allen Holloway [id number] [birth year]  Procedure: Electrical Cardioversion Indications:  Atrial Fibrillation  Procedure Details Consent: Risks of procedure as well as the alternatives and risks of each were explained to the (patient/caregiver).  Consent for procedure obtained. Time Out: Verified patient identification, verified procedure, site/side was marked, verified correct patient position, special equipment/implants available, medications/allergies/relevent history reviewed, required imaging and test results available.  Performed  Patient placed on cardiac monitor, pulse oximetry, supplemental oxygen as necessary.  Sedation given: Propofol per anesthesiology Pacer pads placed anterior and posterior chest.  Cardioverted 1 time(s).  Cardioverted at [place].  Evaluation Findings: Post procedure EKG shows: NSR with type 1 2nd degree AVB Complications: None Patient did tolerate procedure well.   Loralie Champagne 12/27/2018, 8:21 AM

## 2018-12-27 NOTE — Anesthesia Postprocedure Evaluation (Signed)
Anesthesia Post Note  Patient: Allen Holloway  Procedure(s) Performed: CARDIOVERSION (N/A )     Patient location during evaluation: Endoscopy Anesthesia Type: General Level of consciousness: awake and alert, patient cooperative and oriented Pain management: pain level controlled Vital Signs Assessment: post-procedure vital signs reviewed and stable Respiratory status: spontaneous breathing, nonlabored ventilation and respiratory function stable Cardiovascular status: blood pressure returned to baseline and stable Postop Assessment: no apparent nausea or vomiting Anesthetic complications: no    Last Vitals:  Vitals:   12/27/18 0822 12/27/18 0833  BP: (!) 121/49 (!) 115/57  Pulse: (!) 55 (!) 56  Resp: 15 17  Temp: 36.6 C   SpO2: 97% 100%    Last Pain:  Vitals:   12/27/18 0833  TempSrc:   PainSc: 0-No pain                 Melody Cirrincione,E. Gizzelle Lacomb

## 2018-12-27 NOTE — Interval H&P Note (Signed)
History and Physical Interval Note:  12/27/2018 8:09 AM  Allen Holloway  has presented today for surgery, with the diagnosis of a-fib.  The various methods of treatment have been discussed with the patient and family. After consideration of risks, benefits and other options for treatment, the patient has consented to  Procedure(s): CARDIOVERSION (N/A) as a surgical intervention.  The patient's history has been reviewed, patient examined, no change in status, stable for surgery.  I have reviewed the patient's chart and labs.  Questions were answered to the patient's satisfaction.     Jeslin Bazinet [employer]

## 2018-12-27 NOTE — Anesthesia Preprocedure Evaluation (Addendum)
Anesthesia Evaluation  Patient identified by MRN, date of birth, ID band Patient awake    Reviewed: Allergy & Precautions, NPO status , Patient's Chart, lab work & pertinent test results, reviewed documented beta blocker date and time   History of Anesthesia Complications Negative for: history of anesthetic complications  Airway Mallampati: II  TM Distance: >3 FB Neck ROM: Full    Dental  (+) Upper Dentures, Partial Lower, Dental Advisory Given   Pulmonary former smoker,  12/24/2018 SARS coronavirus NEG   breath sounds clear to auscultation       Cardiovascular hypertension, Pt. on medications and Pt. on home beta blockers (-) angina+ CAD and + Peripheral Vascular Disease  + dysrhythmias (Wenckebach) Atrial Fibrillation  Rhythm:Irregular Rate:Normal  '19 ECHO: EF 55-60%, mild MR, mod TR   Neuro/Psych negative neurological ROS     GI/Hepatic Neg liver ROS, GERD  Controlled,  Endo/Other  diabetes (glu 133)Hypothyroidism   Renal/GU Renal InsufficiencyRenal disease     Musculoskeletal   Abdominal   Peds  Hematology  (+) Blood dyscrasia (Hb 8.2), anemia , INR 1.6 eliquis   Anesthesia Other Findings   Reproductive/Obstetrics                            Anesthesia Physical Anesthesia Plan  ASA: III  Anesthesia Plan: General   Post-op Pain Management:    Induction: Intravenous  PONV Risk Score and Plan: 2 and Treatment may vary due to age or medical condition  Airway Management Planned: Natural Airway and Mask  Additional Equipment:   Intra-op Plan:   Post-operative Plan:   Informed Consent: I have reviewed the patients History and Physical, chart, labs and discussed the procedure including the risks, benefits and alternatives for the proposed anesthesia with the patient or authorized representative who has indicated his/her understanding and acceptance.     Dental advisory  given  Plan Discussed with: CRNA and Surgeon  Anesthesia Plan Comments:         Anesthesia Quick Evaluation

## 2018-12-29 ENCOUNTER — Encounter (HOSPITAL_COMMUNITY): Payer: Self-pay | Admitting: Cardiology

## 2019-01-06 ENCOUNTER — Telehealth (HOSPITAL_COMMUNITY): Payer: Self-pay | Admitting: Nurse Practitioner

## 2019-01-06 NOTE — Telephone Encounter (Signed)
Patient called and cancelled his appt for 01/09/2019 @10  am with Roderic Palau.  I offered to reschedule patient's appt and he declined.  He said he has several other appts and will call back when he can come.

## 2019-01-08 ENCOUNTER — Encounter (HOSPITAL_COMMUNITY): Payer: Medicare Other | Admitting: Cardiology

## 2019-01-09 ENCOUNTER — Ambulatory Visit (HOSPITAL_COMMUNITY)
Admission: RE | Admit: 2019-01-09 | Discharge: 2019-01-09 | Disposition: A | Discharge status: Home / Self Care | Payer: Medicare Other | Source: Ambulatory Visit | Attending: Cardiology | Admitting: Cardiology

## 2019-01-09 ENCOUNTER — Other Ambulatory Visit: Payer: Self-pay

## 2019-01-09 ENCOUNTER — Encounter (HOSPITAL_COMMUNITY): Payer: Self-pay | Admitting: Cardiology

## 2019-01-09 ENCOUNTER — Telehealth (HOSPITAL_COMMUNITY): Payer: Self-pay

## 2019-01-09 ENCOUNTER — Ambulatory Visit (HOSPITAL_COMMUNITY): Payer: Medicare Other | Admitting: Nurse Practitioner

## 2019-01-09 VITALS — BP 110/36 | HR 59 | Wt 218.2 lb

## 2019-01-09 DIAGNOSIS — K219 Gastro-esophageal reflux disease without esophagitis: Secondary | ICD-10-CM | POA: Diagnosis not present

## 2019-01-09 DIAGNOSIS — I6523 Occlusion and stenosis of bilateral carotid arteries: Secondary | ICD-10-CM | POA: Insufficient documentation

## 2019-01-09 DIAGNOSIS — Z79899 Other long term (current) drug therapy: Secondary | ICD-10-CM | POA: Insufficient documentation

## 2019-01-09 DIAGNOSIS — D649 Anemia, unspecified: Secondary | ICD-10-CM | POA: Diagnosis not present

## 2019-01-09 DIAGNOSIS — N184 Chronic kidney disease, stage 4 (severe): Secondary | ICD-10-CM | POA: Insufficient documentation

## 2019-01-09 DIAGNOSIS — I350 Nonrheumatic aortic (valve) stenosis: Secondary | ICD-10-CM | POA: Insufficient documentation

## 2019-01-09 DIAGNOSIS — I4891 Unspecified atrial fibrillation: Secondary | ICD-10-CM

## 2019-01-09 DIAGNOSIS — Z8249 Family history of ischemic heart disease and other diseases of the circulatory system: Secondary | ICD-10-CM | POA: Diagnosis not present

## 2019-01-09 DIAGNOSIS — I130 Hypertensive heart and chronic kidney disease with heart failure and stage 1 through stage 4 chronic kidney disease, or unspecified chronic kidney disease: Secondary | ICD-10-CM | POA: Insufficient documentation

## 2019-01-09 DIAGNOSIS — Z87891 Personal history of nicotine dependence: Secondary | ICD-10-CM | POA: Diagnosis not present

## 2019-01-09 DIAGNOSIS — Z7901 Long term (current) use of anticoagulants: Secondary | ICD-10-CM | POA: Insufficient documentation

## 2019-01-09 DIAGNOSIS — E039 Hypothyroidism, unspecified: Secondary | ICD-10-CM | POA: Diagnosis not present

## 2019-01-09 DIAGNOSIS — E785 Hyperlipidemia, unspecified: Secondary | ICD-10-CM | POA: Diagnosis not present

## 2019-01-09 DIAGNOSIS — I480 Paroxysmal atrial fibrillation: Secondary | ICD-10-CM

## 2019-01-09 DIAGNOSIS — M1990 Unspecified osteoarthritis, unspecified site: Secondary | ICD-10-CM | POA: Insufficient documentation

## 2019-01-09 DIAGNOSIS — I5032 Chronic diastolic (congestive) heart failure: Secondary | ICD-10-CM | POA: Insufficient documentation

## 2019-01-09 DIAGNOSIS — Z79890 Hormone replacement therapy (postmenopausal): Secondary | ICD-10-CM | POA: Insufficient documentation

## 2019-01-09 DIAGNOSIS — J449 Chronic obstructive pulmonary disease, unspecified: Secondary | ICD-10-CM | POA: Insufficient documentation

## 2019-01-09 DIAGNOSIS — Z951 Presence of aortocoronary bypass graft: Secondary | ICD-10-CM | POA: Diagnosis not present

## 2019-01-09 DIAGNOSIS — K922 Gastrointestinal hemorrhage, unspecified: Secondary | ICD-10-CM

## 2019-01-09 DIAGNOSIS — I2510 Atherosclerotic heart disease of native coronary artery without angina pectoris: Secondary | ICD-10-CM | POA: Insufficient documentation

## 2019-01-09 LAB — CBC
HCT: 27.1 % — ABNORMAL LOW (ref 39.0–52.0)
Hemoglobin: 7.6 g/dL — ABNORMAL LOW (ref 13.0–17.0)
MCH: 23 pg — ABNORMAL LOW (ref 26.0–34.0)
MCHC: 28 g/dL — ABNORMAL LOW (ref 30.0–36.0)
MCV: 81.9 fL (ref 80.0–100.0)
Platelets: 241 10*3/uL (ref 150–400)
RBC: 3.31 MIL/uL — ABNORMAL LOW (ref 4.22–5.81)
RDW: 17.2 % — ABNORMAL HIGH (ref 11.5–15.5)
WBC: 9.7 10*3/uL (ref 4.0–10.5)
nRBC: 0.4 % — ABNORMAL HIGH (ref 0.0–0.2)

## 2019-01-09 LAB — COMPREHENSIVE METABOLIC PANEL
ALT: 18 U/L (ref 0–44)
AST: 33 U/L (ref 15–41)
Albumin: 3.4 g/dL — ABNORMAL LOW (ref 3.5–5.0)
Alkaline Phosphatase: 92 U/L (ref 38–126)
Anion gap: 9 (ref 5–15)
BUN: 38 mg/dL — ABNORMAL HIGH (ref 8–23)
CO2: 21 mmol/L — ABNORMAL LOW (ref 22–32)
Calcium: 9.4 mg/dL (ref 8.9–10.3)
Chloride: 110 mmol/L (ref 98–111)
Creatinine, Ser: 3.14 mg/dL — ABNORMAL HIGH (ref 0.61–1.24)
GFR calc Af Amer: 19 mL/min — ABNORMAL LOW (ref >60)
GFR calc non Af Amer: 17 mL/min — ABNORMAL LOW (ref >60)
Glucose, Bld: 124 mg/dL — ABNORMAL HIGH (ref 70–99)
Potassium: 5.3 mmol/L — ABNORMAL HIGH (ref 3.5–5.1)
Sodium: 140 mmol/L (ref 135–145)
Total Bilirubin: 0.9 mg/dL (ref 0.3–1.2)
Total Protein: 6.5 g/dL (ref 6.5–8.1)

## 2019-01-09 LAB — TSH: TSH: 4.224 u[IU]/mL (ref 0.350–4.500)

## 2019-01-09 MED ORDER — POTASSIUM CHLORIDE CRYS ER 20 MEQ PO TBCR
20.0000 meq | EXTENDED_RELEASE_TABLET | Freq: Once | ORAL | Status: AC
  Administered 2019-01-09: 20 meq via ORAL
  Filled 2019-01-09: qty 1

## 2019-01-09 MED ORDER — FUROSEMIDE 10 MG/ML IJ SOLN
80.0000 mg | Freq: Once | INTRAMUSCULAR | Status: AC
  Administered 2019-01-09: 80 mg via INTRAVENOUS
  Filled 2019-01-09: qty 8

## 2019-01-09 MED ORDER — AMIODARONE HCL 200 MG PO TABS: 200.0000 mg | ORAL_TABLET | Freq: Every day | ORAL | 3 refills | Status: DC

## 2019-01-09 MED ORDER — TORSEMIDE 20 MG PO TABS: 40.0000 mg | ORAL_TABLET | Freq: Every day | ORAL | 3 refills | Status: DC

## 2019-01-09 NOTE — Progress Notes (Signed)
Patient ID: Allen Holloway, male   DOB: 06-24-1926, 85 y.o.   MRN: [id number] PCP: Dr. Anitra Lauth Cardiology: Dr. Aundra Dubin  85 y.o. with history of CAD s/p CABG in 2005 with nonischemic myoview in 2008 and CKD stage III-IV presents for followup of CAD and chronic diastolic CHF. Echo in 11/18 showed EF 55-60%, moderately dilated RV with normal systolic function, PASP 56 mmHg.  He has had conduction abnormalities noted in the past and was taken off metoprolol.  Echo in 4/19 showed EF 55-60%, moderate RV dilation with normal systolic function.   In 3/20, he had DCCV of atrial fibrillation back to NSR.  He has been on Eliquis.   He was seen in the office in 8/20 and was back in atrial fibrillation.  He was volume overloaded and reported increased dyspnea. I increased Lasix.  I also started him on amiodarone and proceeded with DCCV on 12/27/18.  After the cardioversion, he was in type 1 2nd degree AVB.  Today, he appears to be in a junctional rhythm with rate 59.    He has continued to retain fluid with worsening edema and is getting more short of breath.  He is now using a walker and is short of breath at times walking around his house.  He is occasionally lightheaded with standing and diastolic BP is quite low today.  Mild orthopnea.   No chest pain.   Weight is up considerably.   His hemoglobin returned today markedly low at 7.6.  He has denies BRBPR/melena.    ECG (personally reviewed): junctional rhythm at 59  Labs (6/11): K 5.3, creatinine 1.6, LDL 70, HDL 58, HCT 37, TSH normal  Labs (11/12): K 4.5, creatinine 1.9, LDL 77, HDL 60 Labs (5/14): BNP 296 Labs (9/14): K 4.5, creatinine 1.8 Labs (10/14): K 4.6, creatinine 1.8, BNP 203 Labs (11/14): LDL 86, HDL 43, K 4.8, creatinine 2.0 Labs (1/16): K 5, creatinine 2.0, LDL 143, HDL 41.5 Labs (1/17): LDL 39, HDL 47, K 5, creatinine 2.0 Labs (10/18): LDL 48, HDL 42, K 5.1, creatinine 2.38 Labs (1/19): creatinine 3.34 Labs (11/19): K 4.5, creatinine 2.47,  LDL 58, HDL 51 Labs (1/20): K 4.4, creatinine 2.99 Labs (2/20): TSH mildly elevated with normal free T4 Labs (8/20): LDL 41.  Labs (9/20): K 3.9 => 5.3, creatinine 3.25 => 3.14, hgb 7.6  Allergies (verified):  1) Lotensin (Benazepril Hcl)   Past Medical History:  1. COPD  2. Coronary artery disease: CABG 2005 with LIMA-LAD, SVG-PDA, SVG-D1, SVG-OM. Myoview (9/08) showed EF 63%, no scar or ischemia.  Echo (8/11): EF 55%, mild AI, mild MR, moderate TR, mildly dilated RV, PA systolic pressure 34 mmHg.  Echo (6/14): EF 50-55%, mild LVH, mild AS, mild MR, mildly dilated RV.  3. Hyperlipidemia: Myalgias Lipitor and Crestor, 40 mg Lipitor.  4. Hypertension  5. Carotid bruit: Carotid dopplers (8/12) with 70-35% RICA, 00-93% LICA.  Carotid dopplers (4/14) with 60-79% bilateral ICA stenosis.  Carotid dopplers (10/14) with 60-79% bilateral ICA stenosis. Carotid dopplers (1/16) with 60-79% BICA stenosis.  - Carotid doppler (1/17): 81-82% RICA, 99-37% LICA.  - Carotid doppler (12/18): 40-59% RICA, right subclavian stenosis.  - Carotid dopplers (12/19): 40-59% BICA stenosis.  6. Cholelithiasis  7. CKD stage IV 8. Low back pain  9. Osteoarthritis  10. GERD  11. Hypothyroidism  12. ABIs normal 8/11, 3/15, 12/18.  13. 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  14. Chronic diastolic CHF -  Echo (10/16): EF 55-60%, mild MR, moderate TR.  - Echo (11/18): EF 55-60%, no significant aortic stenosis, moderately dilated RV with normal systolic function, PASP 56 mmHg.  - Echo (4/19): EF 55-60%, moderate RV dilation with normal systolic function, PASP 49 mmHg.  15. Aortic stenosis: Mild on 6/14 echo, minimal on 10/16 echo.  16. Atrial flutter: Atypical.  Noted 1/20, DCCV to NSR in 3/20.  17. Atrial fibrillation: Noted 8/20.   - DCCV to type 1 2nd degree AVB in 9/20.  - Noted to be in junctional rhythm later in 9/20.   Family History:  Family History High  cholesterol  Family History Hypertension   Social History:  Retired  Married  Former Smoker   ROS: All systems reviewed and negative except as per HPI.   Current Outpatient Medications  Medication Sig Dispense Refill  . acetaminophen (TYLENOL) 500 MG tablet Take 500 mg by mouth every 6 (six) hours as needed for moderate pain or headache.    Marland Kitchen amiodarone (PACERONE) 200 MG tablet Take 1 tablet (200 mg total) by mouth daily. 90 tablet 3  . apixaban (ELIQUIS) 5 MG TABS tablet Take 1 tablet (5 mg total) by mouth 2 (two) times daily. 10 tablet 0  . atorvastatin (LIPITOR) 20 MG tablet Take 10 mg by mouth at bedtime.     . Coenzyme Q10 (COQ10) 100 MG CAPS Take 100 mg by mouth daily.    . Garlic 9323 MG CAPS Take 1,000 mg by mouth daily.     [name] Oil 500 MG CAPS Take 500 mg by mouth daily.    Marland Kitchen levothyroxine (SYNTHROID, LEVOTHROID) 100 MCG tablet Take 100 mcg by mouth daily.      Marland Kitchen omeprazole (PRILOSEC) 20 MG capsule Take 20 mg by mouth daily.     Vladimir Faster Glycol-Propyl Glycol (SYSTANE ULTRA OP) Place 1 drop into both eyes 2 (two) times daily.    Marland Kitchen trolamine salicylate (ASPERCREME) 10 % cream Apply 1 application topically as needed for muscle pain.    Marland Kitchen torsemide (DEMADEX) 20 MG tablet Take 2 tablets (40 mg total) by mouth daily. 180 tablet 3   No current facility-administered medications for this encounter.     BP (!) 110/36   Pulse (!) 59   Wt 99 kg (218 lb 3.2 oz)   SpO2 100%   BMI 33.18 kg/m  General: NAD Neck: JVP 12 cm, no thyromegaly or thyroid nodule.  Lungs: Crackles at bases with wheezing CV: Nondisplaced PMI.  Heart regular S1/S2, no S3/S4, no murmur.  2+ edema to knees.  No carotid bruit.  Difficult to palpate pedal pulses.  Abdomen: Soft, nontender, no hepatosplenomegaly, no distention.  Skin: Intact without lesions or rashes.  Neurologic: Alert and oriented x 3.  Psych: Normal affect. Extremities: No clubbing or cyanosis.  HEENT: Normal.   Assessment/Plan: 1.  CAD: Stable without chest pain.   - Continue statin.  - No ASA given Eliquis use.  2. Chronic diastolic CHF:  He appears more volume overloaded today though creatinine mildly improved from last appointment at 3.14.  CHF is worsened by CKD stage IV and probably also atrial fibrillation.  NYHA class IIIb.  Weight is up considerably.  - I am going to give him Lasix 80 mg IV x 1 now in the office.  I will stop po Lasix and start him tomorrow on torsemide 60 mg daily.  He will take this for 3 days then decrease to torsemide 40 mg daily.  Will  need to follow renal function closely, BMET today and again next week.  3. Hyperlipidemia: Myalgias with Crestor, pravastatin, and atorvastatin 40 daily.  He is able to tolerate atorvastatin 20 daily.  Good lipids in 8/20.  4. Carotid stenosis: Repeat carotid dopplers in 12/20.  5. 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  He is in a junctional rhythm today, HR 59 on ECG.   - Stop metoprolol.  - I will continue amiodarone at 200 mg daily for now but will place a 3 day Zio patch to follow rhythm and make sure no significant bradycardia.  6. CKD: Stage IV.  BMET today. Creatinine recently has been stable around 3-3.2.  - I have referred him to nephrology.  7. Atrial arrhythmias: Atypical atrial flutter noted in 1/20, DCCV to NSR in 3/20.  Atrial fibrillation in 8/20 with DCCV to sinus with type 1 2nd degree AVB in 9/20.  Today, he is in a junctional rhythm.   - Need to decrease Eliquis to 2.5 mg bid for age/renal function.  - Stop metoprolol as above, continue amiodarone 200 mg daily for now. Check LFTs/TSH today. He will need a regular eye exam.  8. Anemia:  CBC today came back with profound normocytic anemia, hgb 7.6.  He had not reported BRBPR/melena.  This has been noted by the [facility] also and he is planned to get Fe infusions.  - [facility]  - Needs FOBT and  GI workup, will refer.  - Repeat CBC at visit next week, may have to come off apixaban.   We discussed admission to the hospital today. He wants to try to avoid if possible, but I am not sure that we are going to be able to deal with all his problems (CHF, CKD stage IV, junctional rhythm, and anemia) effectively at home.  He got IV Lasix in the clinic today and I will see him again next week with a BMET and repeat CBC.   Loralie Champagne 01/09/2019

## 2019-01-09 NOTE — Patient Instructions (Addendum)
Lab work done today. We will notify you of any abnormal lab work. No news is good news.  STOP Metoprolol and Furosemide.  Your physician has requested that you have an echocardiogram. Echocardiography is a painless test that uses sound waves to create images of your heart. It provides your doctor with information about the size and shape of your heart and how well your heart's chambers and valves are working. This procedure takes approximately one hour. There are no restrictions for this procedure.  PLEASE take Amiodarone 200mg  daily  START Torsemide 60mg  (3 tabs) FOR THREE DAYS ONLY STARTING TOMORROW (Friday, Saturday, AND Sunday).  ON Monday 01/13/19 start Torsemide at 40mg  daily.  Your provider has recommended that  you wear a Zio Patch for 3 days.  This monitor will record your heart rhythm for our review.  IF you have any symptoms while wearing the monitor please press the button.  If you have any issues with the patch or you notice a red or orange light on it please call the company at [phone].  Once you remove the patch please mail it back to the company as soon as possible so we can get the results. Please removed on 01/12/19 around 10:40am.  You have been given 80mg  IV lasix and 51meq of Potassium today.  Please follow up with the [facility] next week. Lab work will be done again at that time.   At the [facility], you and your health needs are our priority. As part of our continuing mission to provide you with exceptional heart care, we have created designated Provider Care Teams. These Care Teams include your primary Cardiologist (physician) and Advanced Practice Providers (APPs- Physician Assistants and Nurse Practitioners) who all work together to provide you with the care you need, when you need it.   You may see any of the following providers on your designated Care Team at your next follow up: Marland Kitchen Dr Glori Bickers . Dr Loralie Champagne . Darrick Grinder, NP   Please be sure to bring in all your medications bottles to every appointment.

## 2019-01-09 NOTE — Telephone Encounter (Signed)
-----   Message from Larey Dresser, MD sent at 01/09/2019  3:38 PM EDT ----- Yes, place referral and get Fe studies.  ----- Message ----- From: Shonna Chock, CMA Sent: 12/02/5724   3:34 PM EDT To: Larey Dresser, MD  Pt and patients wife advised and verbalized understanding. However, patient states that the [place] has contacted him and wants him to receive 2 iron infusions. Should we still place the referral to GI and have the iron studies done? Please advise.

## 2019-01-10 ENCOUNTER — Ambulatory Visit (HOSPITAL_COMMUNITY)
Admission: RE | Admit: 2019-01-10 | Discharge: 2019-01-10 | Disposition: A | Discharge status: Home / Self Care | Payer: Medicare Other | Source: Ambulatory Visit | Attending: Internal Medicine | Admitting: Internal Medicine

## 2019-01-10 ENCOUNTER — Other Ambulatory Visit (HOSPITAL_COMMUNITY): Payer: Self-pay

## 2019-01-10 DIAGNOSIS — D500 Iron deficiency anemia secondary to blood loss (chronic): Secondary | ICD-10-CM | POA: Diagnosis not present

## 2019-01-10 LAB — IRON AND TIBC
Iron: 17 ug/dL — ABNORMAL LOW (ref 45–182)
Saturation Ratios: 3 % — ABNORMAL LOW (ref 17.9–39.5)
TIBC: 503 ug/dL — ABNORMAL HIGH (ref 250–450)
UIBC: 486 ug/dL

## 2019-01-10 LAB — FERRITIN: Ferritin: 6 ng/mL — ABNORMAL LOW (ref 24–336)

## 2019-01-14 ENCOUNTER — Telehealth (HOSPITAL_COMMUNITY): Payer: Self-pay

## 2019-01-14 NOTE — Telephone Encounter (Signed)
-----   Message from Larey Dresser, MD sent at 01/12/2019 10:38 PM EDT ----- Please set up for IV iron infusion this week.

## 2019-01-14 NOTE — Telephone Encounter (Signed)
Pt aware of low iron. Pt however was aware of low iron. He reports that he received an iron infusion at the [place] yesterday and has another one set up for 2 weeks from now.

## 2019-01-16 ENCOUNTER — Encounter (HOSPITAL_COMMUNITY): Payer: Medicare Other | Admitting: Cardiology

## 2019-01-16 ENCOUNTER — Ambulatory Visit (HOSPITAL_COMMUNITY): Payer: Medicare Other

## 2019-01-28 NOTE — Addendum Note (Signed)
Encounter addended by: Micki Riley, RN on: 01/28/2019 12:06 PM  Actions taken: Imaging Exam ended

## 2019-01-31 ENCOUNTER — Telehealth (HOSPITAL_COMMUNITY): Payer: Self-pay

## 2019-01-31 MED ORDER — METOPROLOL SUCCINATE ER 25 MG PO TB24: 12.5000 mg | ORAL_TABLET | Freq: Every day | ORAL | 6 refills | Status: DC

## 2019-01-31 NOTE — Telephone Encounter (Signed)
Pt's wife called back, she states pt has Metoprolol Tartrate that pt was taking before, 12.5 mg BID, she wants to know if he can restart this or do they have to get the Metoprolol Succinate.    Advised for now to restart the Metoprolol Tartrate 12.5 mg BID, we will verify with Dr Aundra Dubin and let her know next week whether he needs to change to XL or not.

## 2019-01-31 NOTE — Telephone Encounter (Signed)
Pt and wife aware of results of long term monitor. Med changes discussed to discontinue amiodarone and start toprol xl 12.5mg  daily. Verbalized understanding

## 2019-01-31 NOTE — Telephone Encounter (Signed)
-----   Message from Larey Dresser, MD sent at 01/31/2019 12:48 PM EDT ----- He looks like he is in atrial fibrillation despite amiodarone use.  Suspect that he is going to stay in atrial fibrillation.  He can stop amiodarone as it is not holding him in NSR.  After stopping amiodarone, would start back on low dose Toprol XL 12.5 daily.

## 2019-02-02 NOTE — Telephone Encounter (Signed)
Ok to take metoprolol 12.5 mg bid.

## 2019-02-03 NOTE — Telephone Encounter (Signed)
Left message to call back  

## 2019-02-04 NOTE — Addendum Note (Signed)
Addended by: Scarlette Calico on: 02/04/2019 02:24 PM   Modules accepted: Orders

## 2019-02-04 NOTE — Telephone Encounter (Signed)
Pt's wife aware.

## 2019-02-04 NOTE — Addendum Note (Signed)
Addended by: Harvie Junior on: 02/04/2019 12:18 PM   Modules accepted: Orders

## 2019-02-09 ENCOUNTER — Encounter: Payer: Self-pay | Admitting: Family Medicine

## 2019-03-05 ENCOUNTER — Ambulatory Visit (HOSPITAL_COMMUNITY)
Admission: RE | Admit: 2019-03-05 | Discharge: 2019-03-05 | Disposition: A | Discharge status: Home / Self Care | Payer: Medicare Other | Source: Ambulatory Visit | Attending: Cardiology | Admitting: Cardiology

## 2019-03-05 ENCOUNTER — Encounter (HOSPITAL_COMMUNITY): Payer: Self-pay | Admitting: Cardiology

## 2019-03-05 ENCOUNTER — Other Ambulatory Visit: Payer: Self-pay

## 2019-03-05 VITALS — BP 120/70 | HR 74 | Wt 212.8 lb

## 2019-03-05 DIAGNOSIS — E039 Hypothyroidism, unspecified: Secondary | ICD-10-CM | POA: Insufficient documentation

## 2019-03-05 DIAGNOSIS — N184 Chronic kidney disease, stage 4 (severe): Secondary | ICD-10-CM

## 2019-03-05 DIAGNOSIS — E785 Hyperlipidemia, unspecified: Secondary | ICD-10-CM | POA: Insufficient documentation

## 2019-03-05 DIAGNOSIS — I4891 Unspecified atrial fibrillation: Secondary | ICD-10-CM | POA: Insufficient documentation

## 2019-03-05 DIAGNOSIS — Z951 Presence of aortocoronary bypass graft: Secondary | ICD-10-CM | POA: Diagnosis not present

## 2019-03-05 DIAGNOSIS — I350 Nonrheumatic aortic (valve) stenosis: Secondary | ICD-10-CM | POA: Diagnosis not present

## 2019-03-05 DIAGNOSIS — Z7901 Long term (current) use of anticoagulants: Secondary | ICD-10-CM | POA: Insufficient documentation

## 2019-03-05 DIAGNOSIS — K219 Gastro-esophageal reflux disease without esophagitis: Secondary | ICD-10-CM | POA: Diagnosis not present

## 2019-03-05 DIAGNOSIS — Z79899 Other long term (current) drug therapy: Secondary | ICD-10-CM | POA: Diagnosis not present

## 2019-03-05 DIAGNOSIS — Z8249 Family history of ischemic heart disease and other diseases of the circulatory system: Secondary | ICD-10-CM | POA: Insufficient documentation

## 2019-03-05 DIAGNOSIS — I130 Hypertensive heart and chronic kidney disease with heart failure and stage 1 through stage 4 chronic kidney disease, or unspecified chronic kidney disease: Secondary | ICD-10-CM | POA: Insufficient documentation

## 2019-03-05 DIAGNOSIS — M791 Myalgia, unspecified site: Secondary | ICD-10-CM | POA: Diagnosis not present

## 2019-03-05 DIAGNOSIS — Z87891 Personal history of nicotine dependence: Secondary | ICD-10-CM | POA: Diagnosis not present

## 2019-03-05 DIAGNOSIS — M1990 Unspecified osteoarthritis, unspecified site: Secondary | ICD-10-CM | POA: Insufficient documentation

## 2019-03-05 DIAGNOSIS — J449 Chronic obstructive pulmonary disease, unspecified: Secondary | ICD-10-CM | POA: Diagnosis not present

## 2019-03-05 DIAGNOSIS — I890 Lymphedema, not elsewhere classified: Secondary | ICD-10-CM | POA: Insufficient documentation

## 2019-03-05 DIAGNOSIS — D649 Anemia, unspecified: Secondary | ICD-10-CM | POA: Insufficient documentation

## 2019-03-05 DIAGNOSIS — I5032 Chronic diastolic (congestive) heart failure: Secondary | ICD-10-CM | POA: Diagnosis not present

## 2019-03-05 DIAGNOSIS — I2510 Atherosclerotic heart disease of native coronary artery without angina pectoris: Secondary | ICD-10-CM | POA: Insufficient documentation

## 2019-03-05 LAB — BASIC METABOLIC PANEL
Anion gap: 10 (ref 5–15)
BUN: 22 mg/dL (ref 8–23)
CO2: 23 mmol/L (ref 22–32)
Calcium: 9.2 mg/dL (ref 8.9–10.3)
Chloride: 102 mmol/L (ref 98–111)
Creatinine, Ser: 2.07 mg/dL — ABNORMAL HIGH (ref 0.61–1.24)
GFR calc Af Amer: 32 mL/min — ABNORMAL LOW (ref >60)
GFR calc non Af Amer: 27 mL/min — ABNORMAL LOW (ref >60)
Glucose, Bld: 153 mg/dL — ABNORMAL HIGH (ref 70–99)
Potassium: 4.3 mmol/L (ref 3.5–5.1)
Sodium: 135 mmol/L (ref 135–145)

## 2019-03-05 LAB — CBC
HCT: 32.6 % — ABNORMAL LOW (ref 39.0–52.0)
Hemoglobin: 9.5 g/dL — ABNORMAL LOW (ref 13.0–17.0)
MCH: 25.6 pg — ABNORMAL LOW (ref 26.0–34.0)
MCHC: 29.1 g/dL — ABNORMAL LOW (ref 30.0–36.0)
MCV: 87.9 fL (ref 80.0–100.0)
Platelets: 231 10*3/uL (ref 150–400)
RBC: 3.71 MIL/uL — ABNORMAL LOW (ref 4.22–5.81)
RDW: 21.7 % — ABNORMAL HIGH (ref 11.5–15.5)
WBC: 13.2 10*3/uL — ABNORMAL HIGH (ref 4.0–10.5)
nRBC: 0 % (ref 0.0–0.2)

## 2019-03-05 MED ORDER — APIXABAN 2.5 MG PO TABS: 2.5000 mg | ORAL_TABLET | Freq: Two times a day (BID) | ORAL | 11 refills | Status: AC

## 2019-03-05 MED ORDER — TORSEMIDE 20 MG PO TABS: 60.0000 mg | ORAL_TABLET | Freq: Every day | ORAL | 6 refills | Status: AC

## 2019-03-05 MED ORDER — METOPROLOL TARTRATE 25 MG PO TABS: 12.5000 mg | ORAL_TABLET | Freq: Two times a day (BID) | ORAL | 6 refills | Status: AC

## 2019-03-05 NOTE — Progress Notes (Signed)
Patient ID: Allen Holloway, male   DOB: 03/22/1927, 85 y.o.   MRN: [id number] PCP: Dr. Anitra Lauth Cardiology: Dr. Aundra Dubin  85 y.o. with history of CAD s/p CABG in 2005 with nonischemic myoview in 2008 and CKD stage III-IV presents for followup of CAD and chronic diastolic CHF. Echo in 11/18 showed EF 55-60%, moderately dilated RV with normal systolic function, PASP 56 mmHg.  He has had conduction abnormalities noted in the past and was taken off metoprolol.  Echo in 4/19 showed EF 55-60%, moderate RV dilation with normal systolic function.   In 3/20, he had DCCV of atrial fibrillation back to NSR.  He has been on Eliquis.   He was seen in the office in 8/20 and was back in atrial fibrillation.  He was volume overloaded and reported increased dyspnea. I increased Lasix.  I also started him on amiodarone and proceeded with DCCV on 12/27/18.  After the cardioversion, he was in type 1 2nd degree AVB.  At last appointment, he was back in a junctional rhythm. Zio patch was done in 9/20, showing 100% atrial fibrillation despite amiodarone use so amiodarone was stopped.   Also at last appointment, hgb was down to 7.6.  He had IV Fe infusion at the [place].  I had wanted him to see GI but he never got an appointment. Creatinine also has generally trended up recently.     He continues to be short of breath with mild exertion such as walking across his house.  His legs are very swollen.  It sounds like he is getting weekly lymphedema treatments through the [place].  No orthopnea/PND.  No chest pain.  No lightheadedness/syncope/falls.  He and his wife are unsure about his meds, they are using an old med rec form that is not accurate.  Today, he appears to be in a junctional rhythm.   ECG (personally reviewed): junctional rhythm 73, left axis deviation.   Labs (6/11): K 5.3, creatinine 1.6, LDL 70, HDL 58, HCT 37, TSH normal  Labs (11/12): K 4.5, creatinine 1.9, LDL 77, HDL 60 Labs (5/14): BNP 296 Labs (9/14): K 4.5,  creatinine 1.8 Labs (10/14): K 4.6, creatinine 1.8, BNP 203 Labs (11/14): LDL 86, HDL 43, K 4.8, creatinine 2.0 Labs (1/16): K 5, creatinine 2.0, LDL 143, HDL 41.5 Labs (1/17): LDL 39, HDL 47, K 5, creatinine 2.0 Labs (10/18): LDL 48, HDL 42, K 5.1, creatinine 2.38 Labs (1/19): creatinine 3.34 Labs (11/19): K 4.5, creatinine 2.47, LDL 58, HDL 51 Labs (1/20): K 4.4, creatinine 2.99 Labs (2/20): TSH mildly elevated with normal free T4 Labs (8/20): LDL 41.  Labs (9/20): K 3.9 => 5.3, creatinine 3.25 => 3.14, hgb 7.6  Allergies (verified):  1) Lotensin (Benazepril Hcl)   Past Medical History:  1. COPD  2. Coronary artery disease: CABG 2005 with LIMA-LAD, SVG-PDA, SVG-D1, SVG-OM. Myoview (9/08) showed EF 63%, no scar or ischemia.  Echo (8/11): EF 55%, mild AI, mild MR, moderate TR, mildly dilated RV, PA systolic pressure 34 mmHg.  Echo (6/14): EF 50-55%, mild LVH, mild AS, mild MR, mildly dilated RV.  3. Hyperlipidemia: Myalgias Lipitor and Crestor, 40 mg Lipitor.  4. Hypertension  5. Carotid bruit: Carotid dopplers (8/12) with 78-29% RICA, 56-21% LICA.  Carotid dopplers (4/14) with 60-79% bilateral ICA stenosis.  Carotid dopplers (10/14) with 60-79% bilateral ICA stenosis. Carotid dopplers (1/16) with 60-79% BICA stenosis.  - Carotid doppler (1/17): 30-86% RICA, 57-84% LICA.  - Carotid doppler (12/18): 40-59% RICA, right subclavian stenosis.  -  Carotid dopplers (12/19): 40-59% BICA stenosis.  6. Cholelithiasis  7. CKD stage IV 8. Low back pain  9. Osteoarthritis  10. GERD  11. Hypothyroidism  12. ABIs normal 8/11, 3/15, 12/18.  13. 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  14. Chronic diastolic CHF - Echo (81/19): EF 55-60%, mild MR, moderate TR.  - Echo (11/18): EF 55-60%, no significant aortic stenosis, moderately dilated RV with normal systolic function, PASP 56 mmHg.  - Echo (4/19): EF 55-60%, moderate RV dilation with normal  systolic function, PASP 49 mmHg.  15. Aortic stenosis: Mild on 6/14 echo, minimal on 10/16 echo.  16. Atrial flutter: Atypical.  Noted 1/20, DCCV to NSR in 3/20.  17. Atrial fibrillation: Noted 8/20.   - DCCV to type 1 2nd degree AVB in 9/20.  - Noted to be in junctional rhythm later in 9/20.  - Zio patch (9/20): 100% atrial fibrillation despite amiodarone use => amiodarone stopped.   Family History:  Family History High cholesterol  Family History Hypertension   Social History:  Retired  Married  Former Smoker   ROS: All systems reviewed and negative except as per HPI.   Current Outpatient Medications  Medication Sig Dispense Refill  . acetaminophen (TYLENOL) 500 MG tablet Take 500 mg by mouth every 6 (six) hours as needed for moderate pain or headache.    Marland Kitchen apixaban (ELIQUIS) 2.5 MG TABS tablet Take 1 tablet (2.5 mg total) by mouth 2 (two) times daily. 60 tablet 11  . atorvastatin (LIPITOR) 20 MG tablet Take 10 mg by mouth at bedtime.     . Coenzyme Q10 (COQ10) 100 MG CAPS Take 100 mg by mouth daily.    . Garlic 1478 MG CAPS Take 1,000 mg by mouth daily.     [name] Oil 500 MG CAPS Take 500 mg by mouth daily.    Marland Kitchen levothyroxine (SYNTHROID, LEVOTHROID) 100 MCG tablet Take 100 mcg by mouth daily.      . metoprolol tartrate (LOPRESSOR) 25 MG tablet Take 0.5 tablets (12.5 mg total) by mouth 2 (two) times daily. 30 tablet 6  . omeprazole (PRILOSEC) 20 MG capsule Take 20 mg by mouth daily.     Vladimir Faster Glycol-Propyl Glycol (SYSTANE ULTRA OP) Place 1 drop into both eyes 2 (two) times daily.    Marland Kitchen torsemide (DEMADEX) 20 MG tablet Take 3 tablets (60 mg total) by mouth daily. 90 tablet 6  . trolamine salicylate (ASPERCREME) 10 % cream Apply 1 application topically as needed for muscle pain.     No current facility-administered medications for this encounter.     BP 120/70   Pulse 74   Wt 96.5 kg (212 lb 12.8 oz)   SpO2 94%   BMI 32.36 kg/m  General: NAD Neck: JVP 10-12 cm, no  thyromegaly or thyroid nodule.  Lungs: Clear to auscultation bilaterally with normal respiratory effort. CV: Nondisplaced PMI.  Heart regular S1/S2, no S3/S4, no murmur.  2+ edema to knees.  No carotid bruit.  Unable to palpate pedal pulses.  Abdomen: Soft, nontender, no hepatosplenomegaly, no distention.  Skin: Intact without lesions or rashes.  Neurologic: Alert and oriented x 3.  Psych: Normal affect. Extremities: No clubbing or cyanosis.  HEENT: Normal.   Assessment/Plan: 1. CAD: Stable without chest pain.   - Continue atorvastatin.  - No ASA given Eliquis use.  2. Chronic diastolic CHF:  CHF is worsened by CKD stage IV and probably also atrial  fibrillation.  NYHA class IIIb, no change from last appointment though weight is dodwn about 6 lbs.  He remains volume overloaded on exam.  - He will take torsemide 80 mg po x 1 today then torsemide 60 mg daily after tomorrow.  BMET today and again in a week.  - He has significant leg swelling, he is getting lymphedema treatments through the [place].  3. Hyperlipidemia: Myalgias with Crestor, pravastatin, and atorvastatin 40 daily.  He is able to tolerate atorvastatin 20 daily.  Good lipids in 8/20.  4. Carotid stenosis: Repeat carotid dopplers in 12/20.  5. 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  He is in a junctional rhythm today, HR 73 on ECG.  No symptoms of bradycardia.  - He is on metoprolol 12.5 mg bid, he can continue for now.  6. CKD: Stage IV.  BMET today.  - I have referred him to nephrology but he has not yet been seen.  7. Atrial arrhythmias: Atypical atrial flutter noted in 1/20, DCCV to NSR in 3/20.  Atrial fibrillation in 8/20 with DCCV to sinus with type 1 2nd degree AVB in 9/20.  Zio patch in 9/20 after DCCV showed 100% atrial fibrillation and amiodarone was stopped.  Today, he is in a junctional rhythm.   - Continue Eliquis at low dose 2.5 mg bid based on  age and creatinine.   - He is off amiodarone.  He can continue metoprolol 12.5 mg bid.   8. Anemia:  Profound anemia last appointment, hgb 7.6.  He has gotten IV Fe through the [place].  I referred him to GI at last appointment but he cancelled the appt.  - I will see if he can get a GI appt at the [facility].   - CBC today.   He will return next week to be seen by NP/PA, will recheck BMET and adjust diuretics as needed.   He and his wife are having trouble keeping all his meds straight, will enroll him in our paramedicine program.   Loralie Champagne 03/05/2019

## 2019-03-05 NOTE — Patient Instructions (Addendum)
Increase Torsemide to 80 mg (4 tabs) TODAY ONLY  Then:   take 60 mg (3 tabs) DAILY  CARDIAC MEDICATIONS SHOULD BE:  Eliquis 2.5 mg Twice daily  Metoprolol 12.5 mg (1/2 tab) Twice daily   Torsemide 60 mg (3 tabs) DAILY   DO NOT TAKE ANY AMIODARONE   Labs done today, we will notify you of abnormal results  Labs and echocardiogram in 1 week  You have been referred to GI and Kidney specialist, we will contact the [facility] and have them see you for this  Your physician recommends that you schedule a follow-up appointment in: 3 weeks with Dr Aundra Dubin  If you have any questions or concerns before your next appointment please send [place] a message through [facility] or call our office at [phone].  At the [facility], you and your health needs are our priority. As part of our continuing mission to provide you with exceptional heart care, we have created designated Provider Care Teams. These Care Teams include your primary Cardiologist (physician) and Advanced Practice Providers (APPs- Physician Assistants and Nurse Practitioners) who all work together to provide you with the care you need, when you need it.   You may see any of the following providers on your designated Care Team at your next follow up: Marland Kitchen Dr Glori Bickers . Dr Loralie Champagne . Darrick Grinder, NP . Lyda Jester, PA   Please be sure to bring in all your medications bottles to every appointment.

## 2019-03-09 ENCOUNTER — Telehealth: Payer: Self-pay | Admitting: Physician Assistant

## 2019-03-10 NOTE — Telephone Encounter (Signed)
Pt marked deceased in epic, will await death certificate

## 2019-03-12 ENCOUNTER — Other Ambulatory Visit (HOSPITAL_COMMUNITY): Payer: Medicare Other

## 2019-03-12 ENCOUNTER — Ambulatory Visit (HOSPITAL_COMMUNITY): Payer: Medicare Other

## 2019-03-24 ENCOUNTER — Encounter (HOSPITAL_COMMUNITY): Payer: Medicare Other | Admitting: Cardiology

## 2019-03-25 NOTE — Telephone Encounter (Signed)
Yes I will sign. 

## 2019-03-25 NOTE — Telephone Encounter (Signed)
Allen Holloway with [facility] called. Patient found deceased this AM by family. Paramedic notes family witnessed the patient having worsening shortness of breath, edema and had to sleep in a recliner due to shortness of breath. Paramedic called to see if Dr. Aundra Dubin would sign the death certificate.  Patient was seen just last week. I asked Allen Holloway to send the death certificate to Dr. Claris Gladden office. Allen Dopp, PA-C    2019-03-14 8:26 AM

## 2019-03-25 DEATH — deceased
